# Patient Record
Sex: Male | Born: 1940
Health system: Southern US, Community
[De-identification: ages and names within clinical notes are randomized; demographics above are authoritative.]

## PROBLEM LIST (undated history)

## (undated) DIAGNOSIS — R569 Unspecified convulsions: Secondary | ICD-10-CM

## (undated) DIAGNOSIS — I10 Essential (primary) hypertension: Secondary | ICD-10-CM

## (undated) DIAGNOSIS — M1990 Unspecified osteoarthritis, unspecified site: Secondary | ICD-10-CM

## (undated) DIAGNOSIS — H409 Unspecified glaucoma: Secondary | ICD-10-CM

## (undated) DIAGNOSIS — G4730 Sleep apnea, unspecified: Secondary | ICD-10-CM

## (undated) DIAGNOSIS — R7989 Other specified abnormal findings of blood chemistry: Secondary | ICD-10-CM

## (undated) HISTORY — DX: Other specified abnormal findings of blood chemistry: R79.89

## (undated) HISTORY — PX: COLONOSCOPY: SHX174

## (undated) HISTORY — DX: Unspecified glaucoma: H40.9

---

## 1986-06-24 HISTORY — PX: OTHER SURGICAL HISTORY: SHX169

## 1998-01-14 ENCOUNTER — Emergency Department (HOSPITAL_COMMUNITY): Admission: EM | Admit: 1998-01-14 | Discharge: 1998-01-14 | Payer: Self-pay | Admitting: Emergency Medicine

## 2002-09-16 ENCOUNTER — Ambulatory Visit (HOSPITAL_COMMUNITY): Admission: RE | Admit: 2002-09-16 | Discharge: 2002-09-16 | Payer: Self-pay | Admitting: Gastroenterology

## 2007-09-25 ENCOUNTER — Encounter: Admission: RE | Admit: 2007-09-25 | Discharge: 2007-09-25 | Payer: Self-pay | Admitting: Cardiology

## 2008-02-14 ENCOUNTER — Ambulatory Visit (HOSPITAL_BASED_OUTPATIENT_CLINIC_OR_DEPARTMENT_OTHER): Admission: RE | Admit: 2008-02-14 | Discharge: 2008-02-14 | Payer: Self-pay | Admitting: Cardiology

## 2008-02-20 ENCOUNTER — Ambulatory Visit: Payer: Self-pay | Admitting: Internal Medicine

## 2008-08-12 ENCOUNTER — Encounter: Admission: RE | Admit: 2008-08-12 | Discharge: 2008-08-12 | Payer: Self-pay | Admitting: Internal Medicine

## 2009-01-05 ENCOUNTER — Encounter: Admission: RE | Admit: 2009-01-05 | Discharge: 2009-01-05 | Payer: Self-pay | Admitting: Internal Medicine

## 2010-05-06 ENCOUNTER — Emergency Department (HOSPITAL_COMMUNITY)
Admission: EM | Admit: 2010-05-06 | Discharge: 2010-05-06 | Payer: Self-pay | Source: Home / Self Care | Admitting: Family Medicine

## 2010-11-09 NOTE — Procedures (Signed)
Joseph Copeland, Joseph Copeland               ACCOUNT NO.:  [id number]   MEDICAL RECORD NO.:  [id number]          PATIENT TYPE:  OUT   LOCATION:  SLEEP CENTER                 FACILITY:  [facility]   PHYSICIAN:  Clinton D. Maple Hudson, MD, FCCP, FACPDATE OF BIRTH:  February [birth year], 1942   DATE OF STUDY:                            NOCTURNAL POLYSOMNOGRAM   REFERRING PHYSICIAN:  Osvaldo Shipper. Spruill, M.D.   INDICATION FOR STUDY:  Hypersomnia with sleep apnea.   EPWORTH SLEEPINESS SCORE:  4/24.   BMI 23.4, weight 132 pounds, height 63 inches.   MEDICATIONS:  Home medication charted and reviewed.   SLEEP ARCHITECTURE:  Split study protocol.  During the diagnostic phase,  total sleep time 123 minutes with sleep efficiency 69.5%.  Stage 1 was  4.1%, stage 2 83.3%, stage 3 absent, REM 12.6% of total sleep time.  Sleep latency 34 minutes, REM latency 75 minutes.  Awake after sleep  onset 20 minutes.  Arousal index 2.9.   RESPIRATORY DATA:  Split study protocol.  During the diagnostic phase,  an apnea/hypopnea index (AHI) 26.3.  Fifty-four events were scored, all  hypopneas, nonpositional.  CPAP was titrated to 16 CWP, AHI 0 per hour.  A Mirage Quattro mask was used with heated humidifier.   OXYGEN DATA:  Moderate snoring.  Oxygen desaturation to a nadir of 74%.  After CPAP titration, mean oxygenation held 94% on room air.   CARDIAC DATA:  Normal sinus rhythm.   MOVEMENT/PARASOMNIA:  No significant movement disturbance.   IMPRESSIONS/RECOMMENDATIONS:  1. Moderate obstructive sleep apnea/hypopnea syndrome, AHI 26.3 per      hour, all events were hypopneas, nonpositional.  Moderate snoring      with oxygen desaturation to a nadir of 74%.  2. Successful CPAP titration to 16 CWP, AHI 0 per hour.  He chose a      Mirage Quattro mask with heated humidifier.      Clinton D. Maple Hudson, MD, [facility], FACP  Diplomate, Biomedical engineer of Sleep Medicine  Electronically Signed     CDY/MEDQ  D:  02/20/2008 11:07:15  T:  02/20/2008  11:35:37  Job:  [id number]

## 2010-11-09 NOTE — Op Note (Signed)
   NAME:  Joseph Copeland, Joseph Copeland                         ACCOUNT NO.:  [id number]   MEDICAL RECORD NO.:  [id number]                   PATIENT TYPE:  AMB   LOCATION:  ENDO                                 FACILITY:  [facility]   PHYSICIAN:  Danise Edge, M.D.                DATE OF BIRTH:  02-07-1941   DATE OF PROCEDURE:  09/16/2002  DATE OF DISCHARGE:                                 OPERATIVE REPORT   PROCEDURE:  Screening colonoscopy.   INDICATIONS:  The patient is a 70 year old male born 22-Apr-2041.  The patient  is scheduled to undergo his first screening colonoscopy with polypectomy to  prevent colon cancer.   ENDOSCOPIST:  Danise Edge, M.D.   PREMEDICATION:  Versed 7.5 mg, Demerol 50 mg.   DESCRIPTION OF PROCEDURE:  After obtaining informed consent, the patient was  placed in the left lateral decubitus position.  I administered intravenous  Demerol and intravenous Versed to achieve conscious sedation for the  procedure.  The patient's blood pressure, oxygen saturation, and cardiac  rhythm were monitored throughout the procedure and documented in the medical  record.   Anal inspection was normal.  Digital rectal exam was normal.  The prostate  was non-nodular.  The Olympus adult colonoscope was introduced into the  rectum and easily advanced to the cecum.  Colonic preparation for the exam  today was excellent.   The patient does have a few scattered diverticula throughout his colon  without signs of diverticulitis or diverticular stricture formation.   Rectum normal.   Sigmoid colon and descending colon normal.   Splenic flexure normal.   Transverse colon normal.   Hepatic flexure normal.   Ascending colon normal.   Cecum and ileocecal valve normal.    ASSESSMENT:  The patient has a few scattered diverticula throughout his  colon; otherwise normal proctocolonoscopy to the cecum.  No endoscopic  evidence for the presence of colorectal neoplasia.                           Danise Edge, M.D.    MJ/MEDQ  D:  09/16/2002  T:  09/17/2002  Job:  [id number]   cc:   Candyce Churn, M.D.  [address] [place]  [place] [address]  Fax: [phone]

## 2010-12-27 ENCOUNTER — Emergency Department (HOSPITAL_COMMUNITY): Payer: Medicare Other

## 2010-12-27 ENCOUNTER — Inpatient Hospital Stay (HOSPITAL_COMMUNITY)
Admission: EM | Admit: 2010-12-27 | Discharge: 2010-12-29 | DRG: 638 | Disposition: A | Discharge status: Home / Self Care | Payer: Medicare Other | Attending: Internal Medicine | Admitting: Internal Medicine

## 2010-12-27 DIAGNOSIS — E1169 Type 2 diabetes mellitus with other specified complication: Principal | ICD-10-CM | POA: Diagnosis present

## 2010-12-27 DIAGNOSIS — F341 Dysthymic disorder: Secondary | ICD-10-CM | POA: Diagnosis present

## 2010-12-27 DIAGNOSIS — E871 Hypo-osmolality and hyponatremia: Secondary | ICD-10-CM | POA: Diagnosis present

## 2010-12-27 DIAGNOSIS — M47817 Spondylosis without myelopathy or radiculopathy, lumbosacral region: Secondary | ICD-10-CM | POA: Diagnosis present

## 2010-12-27 DIAGNOSIS — H409 Unspecified glaucoma: Secondary | ICD-10-CM | POA: Diagnosis present

## 2010-12-27 DIAGNOSIS — M545 Low back pain, unspecified: Secondary | ICD-10-CM | POA: Diagnosis present

## 2010-12-27 DIAGNOSIS — G8929 Other chronic pain: Secondary | ICD-10-CM | POA: Diagnosis present

## 2010-12-27 DIAGNOSIS — M1610 Unilateral primary osteoarthritis, unspecified hip: Secondary | ICD-10-CM | POA: Diagnosis present

## 2010-12-27 DIAGNOSIS — D72829 Elevated white blood cell count, unspecified: Secondary | ICD-10-CM | POA: Diagnosis present

## 2010-12-27 DIAGNOSIS — J309 Allergic rhinitis, unspecified: Secondary | ICD-10-CM | POA: Diagnosis present

## 2010-12-27 DIAGNOSIS — Z794 Long term (current) use of insulin: Secondary | ICD-10-CM

## 2010-12-27 DIAGNOSIS — IMO0002 Reserved for concepts with insufficient information to code with codable children: Secondary | ICD-10-CM | POA: Diagnosis present

## 2010-12-27 DIAGNOSIS — H538 Other visual disturbances: Secondary | ICD-10-CM | POA: Diagnosis present

## 2010-12-27 DIAGNOSIS — Z7982 Long term (current) use of aspirin: Secondary | ICD-10-CM

## 2010-12-27 DIAGNOSIS — M169 Osteoarthritis of hip, unspecified: Secondary | ICD-10-CM | POA: Diagnosis present

## 2010-12-27 DIAGNOSIS — K5730 Diverticulosis of large intestine without perforation or abscess without bleeding: Secondary | ICD-10-CM | POA: Diagnosis present

## 2010-12-27 DIAGNOSIS — I10 Essential (primary) hypertension: Secondary | ICD-10-CM | POA: Diagnosis present

## 2010-12-27 LAB — CBC
Hemoglobin: 13.8 g/dL (ref 13.0–17.0)
MCH: 32.5 pg (ref 26.0–34.0)
MCHC: 35.6 g/dL (ref 30.0–36.0)
Platelets: 211 10*3/uL (ref 150–400)

## 2010-12-27 LAB — DIFFERENTIAL
Basophils Absolute: 0 10*3/uL (ref 0.0–0.1)
Basophils Relative: 0 % (ref 0–1)
Eosinophils Absolute: 0 10*3/uL (ref 0.0–0.7)
Monocytes Absolute: 2.1 10*3/uL — ABNORMAL HIGH (ref 0.1–1.0)
Monocytes Relative: 14 % — ABNORMAL HIGH (ref 3–12)
Neutro Abs: 12.1 10*3/uL — ABNORMAL HIGH (ref 1.7–7.7)
Neutrophils Relative %: 80 % — ABNORMAL HIGH (ref 43–77)

## 2010-12-27 LAB — BASIC METABOLIC PANEL
Calcium: 9.7 mg/dL (ref 8.4–10.5)
GFR calc Af Amer: 57 mL/min — ABNORMAL LOW (ref >60)
GFR calc non Af Amer: 47 mL/min — ABNORMAL LOW (ref >60)
Glucose, Bld: 393 mg/dL — ABNORMAL HIGH (ref 70–99)
Potassium: 4 mEq/L (ref 3.5–5.1)
Sodium: 123 mEq/L — ABNORMAL LOW (ref 135–145)

## 2010-12-28 ENCOUNTER — Inpatient Hospital Stay (HOSPITAL_COMMUNITY): Payer: Medicare Other

## 2010-12-28 ENCOUNTER — Emergency Department (HOSPITAL_COMMUNITY): Payer: Medicare Other

## 2010-12-28 LAB — URINALYSIS, ROUTINE W REFLEX MICROSCOPIC
Leukocytes, UA: NEGATIVE
Nitrite: NEGATIVE
Protein, ur: NEGATIVE mg/dL
Specific Gravity, Urine: 1.033 — ABNORMAL HIGH (ref 1.005–1.030)
Urobilinogen, UA: 1 mg/dL (ref 0.0–1.0)

## 2010-12-28 LAB — HEPATIC FUNCTION PANEL
ALT: 134 U/L — ABNORMAL HIGH (ref 0–53)
Bilirubin, Direct: 0.3 mg/dL (ref 0.0–0.3)
Indirect Bilirubin: 0.7 mg/dL (ref 0.3–0.9)
Total Protein: 6 g/dL (ref 6.0–8.3)

## 2010-12-28 LAB — GLUCOSE, CAPILLARY
Glucose-Capillary: 313 mg/dL — ABNORMAL HIGH (ref 70–99)
Glucose-Capillary: 267 mg/dL — ABNORMAL HIGH (ref 70–99)
Glucose-Capillary: 135 mg/dL — ABNORMAL HIGH (ref 70–99)
Glucose-Capillary: 198 mg/dL — ABNORMAL HIGH (ref 70–99)

## 2010-12-28 LAB — BASIC METABOLIC PANEL
BUN: 20 mg/dL (ref 6–23)
CO2: 22 mEq/L (ref 19–32)
Calcium: 8.4 mg/dL (ref 8.4–10.5)
Chloride: 96 mEq/L (ref 96–112)
Chloride: 101 mEq/L (ref 96–112)
Creatinine, Ser: 1.08 mg/dL (ref 0.50–1.35)
GFR calc Af Amer: >60 mL/min (ref >60)
GFR calc Af Amer: >60 mL/min (ref >60)
Potassium: 3.9 mEq/L (ref 3.5–5.1)
Sodium: 128 mEq/L — ABNORMAL LOW (ref 135–145)

## 2010-12-28 LAB — CBC
HCT: 33.3 % — ABNORMAL LOW (ref 39.0–52.0)
Hemoglobin: 12 g/dL — ABNORMAL LOW (ref 13.0–17.0)
MCV: 90.7 fL (ref 78.0–100.0)
RBC: 3.67 MIL/uL — ABNORMAL LOW (ref 4.22–5.81)
RDW: 13.6 % (ref 11.5–15.5)
WBC: 10.8 10*3/uL — ABNORMAL HIGH (ref 4.0–10.5)

## 2010-12-28 LAB — CARDIAC PANEL(CRET KIN+CKTOT+MB+TROPI)
CK, MB: 3.5 ng/mL (ref 0.3–4.0)
Total CK: 108 U/L (ref 7–232)
Troponin I: <0.3 ng/mL (ref <0.30)
Troponin I: <0.3 ng/mL (ref <0.30)

## 2010-12-28 LAB — CK TOTAL AND CKMB (NOT AT ARMC)
Relative Index: INVALID (ref 0.0–2.5)
Total CK: 99 U/L (ref 7–232)

## 2010-12-28 LAB — POCT I-STAT 3, ART BLOOD GAS (G3+)
Patient temperature: 98.6
pCO2 arterial: 34.5 mmHg — ABNORMAL LOW (ref 35.0–45.0)
pH, Arterial: 7.385 (ref 7.350–7.450)

## 2010-12-28 LAB — LIPID PANEL
Cholesterol: 223 mg/dL — ABNORMAL HIGH (ref 0–200)
HDL: 52 mg/dL (ref >39)
Total CHOL/HDL Ratio: 4.3 RATIO

## 2010-12-28 LAB — URINE MICROSCOPIC-ADD ON

## 2010-12-28 LAB — SODIUM, URINE, RANDOM: Sodium, Ur: 10 mEq/L

## 2010-12-28 LAB — PROTIME-INR: INR: 1.03 (ref 0.00–1.49)

## 2010-12-28 LAB — CHLORIDE, URINE, RANDOM: Chloride Urine: <24 mEq/L

## 2010-12-28 LAB — APTT: aPTT: 25 seconds (ref 24–37)

## 2010-12-28 LAB — OSMOLALITY, URINE: Osmolality, Ur: 634 mOsm/kg (ref 390–1090)

## 2011-01-01 NOTE — Discharge Summary (Signed)
Joseph Copeland, Joseph Copeland NO.:  [id number]  MEDICAL RECORD NO.:  [id number]  LOCATION:  3033                         FACILITY:  [facility]  PHYSICIAN:  Pearla Dubonnet, M.D.DATE OF BIRTH:  12-Sep-1940  DATE OF ADMISSION:  12/28/2010 DATE OF DISCHARGE:  12/29/2010                              DISCHARGE SUMMARY   DISCHARGE DIAGNOSES: 1. New onset type 2 diabetes mellitus with blood sugar on admission of     393. 2. Lumbar degenerative joint disease with recent epidural injection     approximately 2 weeks prior to admission. 3. Dizziness and blurred vision, improved with rehydration and blood     sugar control. 4. Hypertension. 5. Dysthymia. 6. Sleep apnea. 7. Mild glaucoma. 8. Degenerative joint disease of the hips. 9. Episodic sciatica with L3-4 radiculopathy, followed by Dr. Barnett Copeland. 10.Diverticulosis. 11.Allergic rhinitis.  DISCHARGE MEDICATIONS: 1. Amlodipine 5 mg daily. 2. Diovan 160 mg 1/2 tablet daily. 3. Aspirin 81 mg a day. 4. Tramadol 50 mg q.4-6 hours p.r.n. pain. 5. Azucena Freed 2% gel 4 times daily for hypogonadism. 6. Metformin 500 mg t.i.d. with meals.  DISCHARGE LABORATORY DATA:  Serial cardiac enzymes within normal limits x3.  Hemoglobin A1c elevated at 9.3%.  Cholesterol profile reveals total cholesterol 223, LDL 148, HDL 52 with a ratio of 4.23.  Basic metabolic profile on admission revealed a sodium 123, potassium 4.0, chloride 86, bicarb 23, glucose 393, BUN 26, and creatinine 1.48 and BMET on December 28, 2010, at 7:05 a.m. revealed a sodium 132, potassium 3.9, chloride 101, bicarb 22, glucose 279, BUN 20, and creatinine 0.92.  Hepatic profile on December 28, 2010, revealed a total bili 1.0, direct bili 0.3, indirect bili 0.7, alk phos 81, AST mildly elevated at 85, and ALT moderately elevated at 134.  Albumin 2.8, total protein 6.0.  Urinalysis on admission revealed the urine to be amber, cloudy with specific gravity of 1.033, urine  glucose greater than 1000, 15 ketones, otherwise negative. Microscopic revealed rare squamous epithelial cells, 3-6 white cells, 0- 2 red cells, and rare bacteria, there was some mucus present.  RADIOLOGICAL IMAGES:  Chest x-ray on December 28, 2010, revealing no acute cardiopulmonary process and CT scan of the head without contrast revealed mild atrophy with no acute intracranial abnormality and MRI and MRA is still pending at the time of dictation.  FAMILY HISTORY:  Significant for glaucoma and seizures.  One sister has diabetes and is on metformin which she tolerates well.  HOSPITAL COURSE:  Joseph Copeland is a very pleasant 70 year old male with known history of hypertension, chronic low back pain and he received an epidural shot approximately 2 weeks ago and he has been having polyphagia, polydipsia, and polyuria over the past 10 days to 2 weeks. On the morning of admission, he was walking towards his car and getting ready to go to a trip for the beach and seemed to be unsteady on his feet and slightly confused.  His wife brought him to the emergency room where his blood sugar was found to be approximately 400.  He was also hyponatremic and with sliding scale NovoLog insulin and saline hydration, laboratories have corrected very  quickly.  He was also put on Lantus 5 units daily.  He is feeling much better even after less than 24 hours after admission. He is receiving diabetic teaching.  We will plan to discharge him home on a no-concentrated sweet diet with no added salt and will discontinue thiazide therapy and add metformin 500 mg with meals and see back in the office early next week.  The patient should, again, refrain from all simple sugars such as soft drinks, etc.  The patient does have an MRI/MRA pending to rule out any intracranial abnormality causing his confusion and disequilibrium yesterday.  CONDITION ON DISCHARGE:  Improving.  Time spent evaluating the patient reviewing  the chart and performing discharge summary was 35 minutes.     Pearla Dubonnet, M.D.     RNG/MEDQ  D:  12/28/2010  T:  12/29/2010  Job:  [id number]  Electronically Signed by Marden Noble M.D. on 01/01/2011 09:47:33 PM

## 2011-01-08 NOTE — H&P (Signed)
NAMECHANNING, YEAGER NO.:  [id number]  MEDICAL RECORD NO.:  [id number]  LOCATION:                                 FACILITY:  PHYSICIAN:  Eduard Clos, MDDATE OF BIRTH:  [birth year]  DATE OF ADMISSION: DATE OF DISCHARGE:                             HISTORY & PHYSICAL   PRIMARY CARE PHYSICIAN:  Pearla Dubonnet, MD  CHIEF COMPLAINT:  Dizziness and blurred vision.  HISTORY OF PRESENT ILLNESS:  This 70 year old male with known history of hypertension and chronic low back pain, for which he has received an epidural shot 2 weeks ago presents with complaints of dizziness and blurred vision which happened from yesterday morning which was persistent.  By the time of evening, he went to the urgent care, he was referred to the ER.  The patient was found to be hyponatremic and hyperglycemic.  The patient has not had a previous history of diabetes.  The patient denies any chest pain, shortness of breath, nausea, vomiting, abdominal pain, dysuria, discharge, diarrhoea.  Denies any difficulty swallowing or speaking.  Denies any focal deficit.  PAST MEDICAL HISTORY: 1. Hypertension. 2. Chronic low back pain.  PAST SURGICAL HISTORY:  Epidural shots.  MEDICATIONS PRIOR TO ADMISSION:  The patient is on multivitamins, aspirin 81 mg p.o. daily, Zylet eye drops, Fortesta 10 mg gel pump, Azor.  ALLERGIES:  No known drug allergies.  FAMILY HISTORY:  Negative for any stroke, diabetes, cancer, or MI as per the patient.  SOCIAL HISTORY:  The patient denies smoking cigarettes, drinking alcohol, or using illegal drugs.  REVIEW OF SYSTEMS:  As per the history of presenting illness, nothing else significant.  PHYSICAL EXAMINATION:  GENERAL:  The patient was examined at bedside, not in acute distress. VITAL SIGNS:  Blood pressure is 110/60, pulse is 90 per minute, temperature 98.6, respirations 18 per minute, O2 sat is 98%. HEENT:  Anicteric.  No pallor.  No  discharge from ears, eyes, nose, or mouth. CHEST:  Bilateral air entry present.  No rhonchi, no crepitation. HEART:  S1 and S2 heard. ABDOMEN:  Soft, nontender.  Bowel sounds heard. CENTRAL NERVOUS SYSTEM:  The patient is alert, awake, and oriented to time, place, and person.  He is able to move upper and lower extremities, 5/5.  There is no pronator drift, dysdiadochocinesia, or ataxia.  The patient's facial features, there is no focal deficit. PERRLA positive.  No neck rigidity.  Tongue is midline. EXTREMITIES:  Peripheral pulses felt.  No edema.  LABORATORY DATA:  EKG shows normal sinus rhythm, heart rate is around 82 beats per minute with nonspecific ST-T changes. Chest x-ray shows no acute cardiopulmonary process. WBC is 15.2, hemoglobin is 13.8, hematocrit is 38.8, platelets 211, neutrophils is 80%.  Basic metabolic panel:  Sodium 123, potassium 4, chloride 86, carbon dioxide 23, glucose 393, BUN 26, creatinine 1.4, calcium 9.7.  Troponin less than 0.3.  UA shows cloudy urine, glucose 1000, ketones 15.  The patient's anion gap is 14.  Nitrites negative, leukocytes negative, wbc's 3-6, hyaline casts, bacteria rare.  ASSESSMENT: 1. Dizziness and blurred vision. 2. Hyperglycemia with possible new-onset diabetes mellitus, type 2. 3. History of hypertension. 4.  Leukocytosis. 5. Hyponatremia.  PLAN: 1. At this time, I will admit the patient to telemetry. 2. Dizziness with blurred vision.  At this time, it could be from     dehydration and hyperglycemia, but we have to rule out CVA, for     which a CT head has been ordered at this time.  We will also get an     MRI of the brain.  We will place the patient on neuro checks, get     carotid Doppler and 2-D echo. 3. Hyperglycemia.  At this time, the patient's anion gap is 14.  The     patient probably has new-onset diabetes mellitus, type 2.  I am     going to repeat his BMET again to make sure his anion gap is not     worsening; if  his anion gap is worsening, then we need to keep the     patient on IV insulin drip; if it is getting better, then     eventually the patient will need definite antidiabetic medications,     for which at this time I am going to order hemoglobin A1c.  The     patient will be placed on sliding scale coverage and continued     hydration. 4. Hyponatremia.  Hyponatremia could be from dehydration, also from     the hyperglycemia.  We will closely watch his BMET, for which I     have ordered one at this time and we will be ordering two more q.4     hourly. 5. We will be checking fasting lipid panel, hemoglobin A1c, and     cycling cardiac markers, and at this time, CT head is still     pending.  We will follow CT head and eventually MRI of the brain.     Also get an ABG and further recommendations based on test order and     clinical course.     Eduard Clos, MD     ANK/MEDQ  D:  12/28/2010  T:  12/28/2010  Job:  [id number]  cc:   Pearla Dubonnet, M.D.  Electronically Signed by Midge Minium MD on 01/08/2011 07:31:52 AM

## 2011-06-03 ENCOUNTER — Inpatient Hospital Stay (HOSPITAL_COMMUNITY): Payer: Medicare Other

## 2011-06-03 ENCOUNTER — Inpatient Hospital Stay (HOSPITAL_COMMUNITY)
Admission: EM | Admit: 2011-06-03 | Discharge: 2011-06-05 | DRG: 101 | Disposition: A | Discharge status: Home / Self Care | Payer: Medicare Other | Attending: Internal Medicine | Admitting: Internal Medicine

## 2011-06-03 ENCOUNTER — Other Ambulatory Visit: Payer: Self-pay

## 2011-06-03 ENCOUNTER — Emergency Department (HOSPITAL_COMMUNITY): Payer: Medicare Other

## 2011-06-03 ENCOUNTER — Encounter: Payer: Self-pay | Admitting: Emergency Medicine

## 2011-06-03 DIAGNOSIS — Z87891 Personal history of nicotine dependence: Secondary | ICD-10-CM

## 2011-06-03 DIAGNOSIS — E118 Type 2 diabetes mellitus with unspecified complications: Secondary | ICD-10-CM | POA: Diagnosis present

## 2011-06-03 DIAGNOSIS — F1020 Alcohol dependence, uncomplicated: Secondary | ICD-10-CM | POA: Diagnosis present

## 2011-06-03 DIAGNOSIS — M549 Dorsalgia, unspecified: Secondary | ICD-10-CM | POA: Diagnosis present

## 2011-06-03 DIAGNOSIS — I10 Essential (primary) hypertension: Secondary | ICD-10-CM | POA: Diagnosis present

## 2011-06-03 DIAGNOSIS — D696 Thrombocytopenia, unspecified: Secondary | ICD-10-CM | POA: Diagnosis present

## 2011-06-03 DIAGNOSIS — E119 Type 2 diabetes mellitus without complications: Secondary | ICD-10-CM | POA: Diagnosis present

## 2011-06-03 DIAGNOSIS — Z79899 Other long term (current) drug therapy: Secondary | ICD-10-CM

## 2011-06-03 DIAGNOSIS — G8929 Other chronic pain: Secondary | ICD-10-CM | POA: Diagnosis present

## 2011-06-03 DIAGNOSIS — M129 Arthropathy, unspecified: Secondary | ICD-10-CM | POA: Diagnosis present

## 2011-06-03 DIAGNOSIS — G40909 Epilepsy, unspecified, not intractable, without status epilepticus: Principal | ICD-10-CM | POA: Diagnosis present

## 2011-06-03 DIAGNOSIS — D649 Anemia, unspecified: Secondary | ICD-10-CM | POA: Diagnosis present

## 2011-06-03 DIAGNOSIS — J029 Acute pharyngitis, unspecified: Secondary | ICD-10-CM | POA: Diagnosis present

## 2011-06-03 DIAGNOSIS — R569 Unspecified convulsions: Secondary | ICD-10-CM

## 2011-06-03 DIAGNOSIS — I672 Cerebral atherosclerosis: Secondary | ICD-10-CM | POA: Diagnosis present

## 2011-06-03 HISTORY — DX: Essential (primary) hypertension: I10

## 2011-06-03 HISTORY — DX: Unspecified convulsions: R56.9

## 2011-06-03 HISTORY — DX: Unspecified osteoarthritis, unspecified site: M19.90

## 2011-06-03 LAB — CARDIAC PANEL(CRET KIN+CKTOT+MB+TROPI): Total CK: 1887 U/L — ABNORMAL HIGH (ref 7–232)

## 2011-06-03 LAB — PHOSPHORUS: Phosphorus: 1.1 mg/dL — ABNORMAL LOW (ref 2.3–4.6)

## 2011-06-03 LAB — DIFFERENTIAL
Eosinophils Relative: 0 % (ref 0–5)
Lymphocytes Relative: 15 % (ref 12–46)
Lymphs Abs: 0.9 10*3/uL (ref 0.7–4.0)
Monocytes Absolute: 0.5 10*3/uL (ref 0.1–1.0)

## 2011-06-03 LAB — COMPREHENSIVE METABOLIC PANEL
ALT: 51 U/L (ref 0–53)
CO2: 19 mEq/L (ref 19–32)
Calcium: 8.7 mg/dL (ref 8.4–10.5)
Creatinine, Ser: 0.68 mg/dL (ref 0.50–1.35)
GFR calc Af Amer: >90 mL/min (ref >90)
GFR calc non Af Amer: >90 mL/min (ref >90)
Glucose, Bld: 203 mg/dL — ABNORMAL HIGH (ref 70–99)

## 2011-06-03 LAB — CBC
HCT: 35.6 % — ABNORMAL LOW (ref 39.0–52.0)
MCV: 94.9 fL (ref 78.0–100.0)
RBC: 3.75 MIL/uL — ABNORMAL LOW (ref 4.22–5.81)
WBC: 5.8 10*3/uL (ref 4.0–10.5)

## 2011-06-03 LAB — RETICULOCYTES
RBC.: 3.83 MIL/uL — ABNORMAL LOW (ref 4.22–5.81)
Retic Ct Pct: 1.2 % (ref 0.4–3.1)

## 2011-06-03 LAB — URINALYSIS, ROUTINE W REFLEX MICROSCOPIC
Glucose, UA: NEGATIVE mg/dL
Hgb urine dipstick: NEGATIVE
Ketones, ur: NEGATIVE mg/dL
Protein, ur: NEGATIVE mg/dL

## 2011-06-03 LAB — APTT: aPTT: 31 seconds (ref 24–37)

## 2011-06-03 LAB — GLUCOSE, CAPILLARY
Glucose-Capillary: 183 mg/dL — ABNORMAL HIGH (ref 70–99)
Glucose-Capillary: 115 mg/dL — ABNORMAL HIGH (ref 70–99)

## 2011-06-03 LAB — IRON AND TIBC
Saturation Ratios: 37 % (ref 20–55)
UIBC: 226 ug/dL (ref 125–400)

## 2011-06-03 MED ORDER — MORPHINE SULFATE 2 MG/ML IJ SOLN: 0.5000 mg | INTRAMUSCULAR | Status: DC | PRN

## 2011-06-03 MED ORDER — OMEGA-3-ACID ETHYL ESTERS 1 G PO CAPS
2.0000 g | ORAL_CAPSULE | Freq: Every day | ORAL | Status: DC
  Administered 2011-06-03 – 2011-06-05 (×3): 2 g via ORAL
  Filled 2011-06-03 (×3): qty 2

## 2011-06-03 MED ORDER — PHENYTOIN SODIUM 50 MG/ML IJ SOLN
800.0000 mg | Freq: Once | INTRAMUSCULAR | Status: AC
  Administered 2011-06-03: 800 mg via INTRAVENOUS
  Filled 2011-06-03: qty 16

## 2011-06-03 MED ORDER — OMEGA-3-ACID ETHYL ESTERS 1 G PO CAPS
2.0000 g | ORAL_CAPSULE | Freq: Every day | ORAL | Status: DC
  Administered 2011-06-04: 2 g via ORAL
  Filled 2011-06-03 (×3): qty 2

## 2011-06-03 MED ORDER — METOPROLOL TARTRATE 12.5 MG HALF TABLET
12.5000 mg | ORAL_TABLET | Freq: Two times a day (BID) | ORAL | Status: DC
  Administered 2011-06-03 – 2011-06-05 (×5): 12.5 mg via ORAL
  Filled 2011-06-03 (×6): qty 1

## 2011-06-03 MED ORDER — PHENYTOIN 50 MG PO CHEW
100.0000 mg | CHEWABLE_TABLET | Freq: Three times a day (TID) | ORAL | Status: DC
  Administered 2011-06-04 – 2011-06-05 (×6): 100 mg via ORAL
  Filled 2011-06-03 (×7): qty 2

## 2011-06-03 MED ORDER — SODIUM CHLORIDE 0.9 % IV BOLUS (SEPSIS)
1000.0000 mL | Freq: Once | INTRAVENOUS | Status: AC
  Administered 2011-06-03: 1000 mL via INTRAVENOUS

## 2011-06-03 MED ORDER — WHITE PETROLATUM GEL
Status: AC
  Administered 2011-06-03: 16:00:00
  Filled 2011-06-03: qty 5

## 2011-06-03 MED ORDER — DEXTROSE 5 % IV SOLN
15.0000 mmol | Freq: Once | INTRAVENOUS | Status: AC
  Administered 2011-06-03: 15 mmol via INTRAVENOUS
  Filled 2011-06-03: qty 5

## 2011-06-03 MED ORDER — MAGNESIUM OXIDE 400 MG PO TABS
400.0000 mg | ORAL_TABLET | Freq: Two times a day (BID) | ORAL | Status: DC
  Administered 2011-06-04 – 2011-06-05 (×4): 400 mg via ORAL
  Filled 2011-06-03 (×5): qty 1

## 2011-06-03 MED ORDER — LORAZEPAM 2 MG/ML IJ SOLN
1.0000 mg | INTRAMUSCULAR | Status: DC | PRN
  Administered 2011-06-03: 1 mg via INTRAVENOUS
  Filled 2011-06-03: qty 1

## 2011-06-03 MED ORDER — LORAZEPAM 2 MG/ML IJ SOLN
INTRAMUSCULAR | Status: AC
  Administered 2011-06-03: 2 mg
  Filled 2011-06-03: qty 1

## 2011-06-03 MED ORDER — ONDANSETRON HCL 4 MG/2ML IJ SOLN: 4.0000 mg | Freq: Four times a day (QID) | INTRAMUSCULAR | Status: DC | PRN

## 2011-06-03 MED ORDER — THERA M PLUS PO TABS
1.0000 | ORAL_TABLET | Freq: Every day | ORAL | Status: DC
  Administered 2011-06-03 – 2011-06-05 (×3): 1 via ORAL
  Filled 2011-06-03 (×3): qty 1

## 2011-06-03 MED ORDER — ACETAMINOPHEN 325 MG PO TABS
650.0000 mg | ORAL_TABLET | Freq: Four times a day (QID) | ORAL | Status: DC | PRN
  Filled 2011-06-03: qty 2

## 2011-06-03 MED ORDER — FOLIC ACID 1 MG PO TABS
1.0000 mg | ORAL_TABLET | Freq: Every day | ORAL | Status: DC
  Administered 2011-06-03 – 2011-06-05 (×3): 1 mg via ORAL
  Filled 2011-06-03 (×3): qty 1

## 2011-06-03 MED ORDER — OMEGA-3 FATTY ACIDS 1000 MG PO CAPS: 2.0000 g | ORAL_CAPSULE | Freq: Every day | ORAL | Status: DC

## 2011-06-03 MED ORDER — ACETAMINOPHEN 650 MG RE SUPP: 650.0000 mg | Freq: Four times a day (QID) | RECTAL | Status: DC | PRN

## 2011-06-03 MED ORDER — OXYCODONE HCL 5 MG PO TABS
5.0000 mg | ORAL_TABLET | ORAL | Status: DC | PRN
  Administered 2011-06-05 (×2): 5 mg via ORAL
  Filled 2011-06-03 (×2): qty 1

## 2011-06-03 MED ORDER — MAGNESIUM SULFATE 40 MG/ML IJ SOLN
2.0000 g | Freq: Once | INTRAMUSCULAR | Status: AC
  Administered 2011-06-03: 2 g via INTRAVENOUS
  Filled 2011-06-03: qty 50

## 2011-06-03 MED ORDER — VITAMIN B-1 100 MG PO TABS
100.0000 mg | ORAL_TABLET | Freq: Every day | ORAL | Status: DC
  Administered 2011-06-03 – 2011-06-05 (×3): 100 mg via ORAL
  Filled 2011-06-03 (×3): qty 1

## 2011-06-03 MED ORDER — DOCUSATE SODIUM 100 MG PO CAPS
100.0000 mg | ORAL_CAPSULE | Freq: Two times a day (BID) | ORAL | Status: DC
  Administered 2011-06-03 – 2011-06-05 (×5): 100 mg via ORAL
  Filled 2011-06-03 (×4): qty 1

## 2011-06-03 MED ORDER — SODIUM CHLORIDE 0.9 % IV SOLN: INTRAVENOUS | Status: AC

## 2011-06-03 MED ORDER — ONDANSETRON HCL 4 MG PO TABS: 4.0000 mg | ORAL_TABLET | Freq: Four times a day (QID) | ORAL | Status: DC | PRN

## 2011-06-03 MED ORDER — PANTOPRAZOLE SODIUM 40 MG PO TBEC
40.0000 mg | DELAYED_RELEASE_TABLET | Freq: Every day | ORAL | Status: DC
  Administered 2011-06-03 – 2011-06-05 (×3): 40 mg via ORAL
  Filled 2011-06-03 (×3): qty 1

## 2011-06-03 NOTE — H&P (Signed)
PCP:   Dr Robley Fries.   Chief Complaint:  Seizures this morning.  HPI: Joseph Copeland was noted to have jerking movements with frothing in the mouth by his wife when they wake up this morning. Patient would not respond to the wife, who immediately called 911. EMS felt he had seizures. This is the first such episode. Patient denies any prodromal symptoms and had been doing well day prior. No trauma. He drinks alcohol but wife says this is just on occasions. He lst drank last week. Patient denies headache or fever. No diarrhea. No illicit drugs. He has some htn/dm but has not been on meds? Diet controlled. Investigations in ED show normal ct brain/cxr/ekg. He has some anemia. Patient given ativan/dilantin in ed.  Review of Systems:  The patient denies anorexia, fever, weight loss,, vision loss, decreased hearing, hoarseness, chest pain, syncope, dyspnea on exertion, peripheral edema, balance deficits, hemoptysis, abdominal pain, melena, hematochezia, severe indigestion/heartburn, hematuria, incontinence, genital sores, muscle weakness, suspicious skin lesions, transient blindness, difficulty walking, depression, unusual weight change, abnormal bleeding, enlarged lymph nodes, angioedema, and breast masses.  Past Medical History: Past Medical History  Diagnosis Date  . Shortness of breath   . Seizures   . Hypertension   . Arthritis   . Diabetes mellitus     type 2   Past Surgical History  Procedure Date  . Hemrhoidectomy     Medications: Prior to Admission medications   Medication Sig Start Date End Date Taking? Authorizing Provider  CALCIUM CARBONATE PO Take 1 tablet by mouth daily.     Yes Historical Provider, MD  Cholecalciferol (VITAMIN D PO) Take 1 tablet by mouth daily.     Yes Historical Provider, MD  Cyanocobalamin (VITAMIN B 12 PO) Take 1 tablet by mouth daily.     Yes Historical Provider, MD  fish oil-omega-3 fatty acids 1000 MG capsule Take 2 g by mouth daily.     Yes Historical  Provider, MD  Multiple Vitamins-Minerals (MULTIVITAMINS THER. W/MINERALS) TABS Take 1 tablet by mouth daily.     Yes Historical Provider, MD  VITAMIN A PO Take 1 tablet by mouth daily.     Yes Historical Provider, MD    Allergies:  No Known Allergies  Social History:  reports that he has quit smoking. He has never used smokeless tobacco. He reports that he drinks about 12.6 ounces of alcohol per week. He reports that he does not use illicit drugs.  Family History: History reviewed. No pertinent family history.  Physical Exam: Filed Vitals:   06/03/11 0632 06/03/11 0700 06/03/11 1034 06/03/11 1834  BP: 147/89 164/94 108/60 132/78  Pulse: 100 93 109   Temp: 98.5 F (36.9 C)  98.1 F (36.7 C) 98.5 F (36.9 C)  TempSrc: Oral  Oral Oral  Resp: 21 17 18 18   SpO2: 96% 97% 93% 94%   General appearance: alert but groggy. Pale. HEENT- PERRLA. No nystagmus. No neck stiffness. RS-lungs clear. CVS-S1S2.RRR. Abdomen- soft, non tender. +BS CNS- grossly intact. Extremities-no pedal edema.    Labs on Admission:   [facility] 06/03/11 1611 06/03/11 0646  NA -- 137  K -- 4.0  CL -- 99  CO2 -- 19  GLUCOSE -- 203*  BUN -- 8  CREATININE -- 0.68  CALCIUM -- 8.7  MG 1.4* --  PHOS 1.1* --    Basename 06/03/11 0646  AST 83*  ALT 51  ALKPHOS 90  BILITOT 0.6  PROT 7.4  ALBUMIN 3.6   No results  found for this basename: LIPASE:2,AMYLASE:2 in the last 72 hours  Basename 06/03/11 0646  WBC 5.8  NEUTROABS 4.4  HGB 11.7*  HCT 35.6*  MCV 94.9  PLT 126*   No results found for this basename: CKTOTAL:3,CKMB:3,CKMBINDEX:3,TROPONINI:3 in the last 72 hours No results found for this basename: TSH,T4TOTAL,FREET3,T3FREE,THYROIDAB in the last 72 hours  Basename 06/03/11 1611  VITAMINB12 --  FOLATE --  FERRITIN --  TIBC --  IRON --  RETICCTPCT 1.2    Radiological Exams on Admission: Ct Head Wo Contrast  06/03/2011  *RADIOLOGY REPORT*  Clinical Data: C year.  CT HEAD WITHOUT  CONTRAST  Technique:  Contiguous axial images were obtained from the base of the skull through the vertex without contrast.  Comparison: Head CT 12/28/2010.  Findings: There is stable age advanced cerebral atrophy.  The ventricles are in the midline without mass effect or shift.  No extra-axial fluid collections are identified.  No CT findings for acute hemispheric infarction and/or intracranial hemorrhage. Stable benign appearing bilateral basal ganglia calcifications. The brainstem and cerebellum appear normal.  No mass lesions.  The bony structures are intact.  The paranasal sinuses and mastoid air cells are clear.  IMPRESSION:  1.  Stable age advanced cerebral atrophy. 2.  No acute intracranial findings or mass lesions.  Original Report Authenticated By: P. Loralie Champagne, M.D.   Portable Chest 1 View  06/03/2011  *RADIOLOGY REPORT*  Clinical Data: New onset seizure.  Question pneumonia.  PORTABLE CHEST - 1 VIEW  Comparison: 12/28/2010 and 01/03/2009 radiographs.  Findings: 1618 hours.  There is mild patient rotation to the right. Heart size and mediastinal contours are stable.  The lungs are clear.  There is no pleural effusion or pneumothorax.  Telemetry leads overlie the chest.  IMPRESSION: No active cardiopulmonary process.  Original Report Authenticated By: Gerrianne Scale, M.D.    Assessment Adult onset seizures in amle with htn/dm, apparently alcoholic. ?withdrawal seizures vs other etiologies. Plan .Seizures, generalized convulsive- admit telemetry. MRI/EEG. Dilantin/ativan as patient had more than 1 episode, one witnessed in ER. Marland KitchenHTN (hypertension)- start low dose lopressor. .DM (diabetes mellitus), type 2- ssi/hbA1C. Marland Kitchen Anemia- anemia panel/rpr .Thrombocytopenia- likely secondary to alcoholism .alcoholism- ativan prn/magnesium oxide/folate/thiamine/abdominal ultrasound. Dvt/gi prophylaxis. Dr Kevan Ny will see patient in am. Condition guarded. Gaynor Ferreras 06/03/2011, 7:12 PM

## 2011-06-03 NOTE — ED Notes (Signed)
Seizure precautions in place currently.

## 2011-06-03 NOTE — ED Notes (Signed)
Nursing called to room. Pt was having tonic clonic seizure. Pt rolled onto right side, non rebreather placed on patient, suction at the bedside. Nursing pulled meds and gave them. Seizure lasted approx 1 minute. Pt bit his tongue during seizure. Airway kept clear.edp made aware of change and he came to speak with the family. Pt's family has been updated and informed of plan for admission. Pt was incontinent after seizure during post ictal phase. Gown and bed changed and pt cleaned. Nursing to continue to monitor. cbg was obtained due to pt's diaphoresis and was wnl.

## 2011-06-03 NOTE — ED Notes (Signed)
Patient transported to CT 

## 2011-06-03 NOTE — ED Provider Notes (Signed)
History     CSN: [id number] Arrival date & time: 06/03/2011  6:31 AM   First MD Initiated Contact with Patient 06/03/11 [phone]      Chief Complaint  Patient presents with  . Seizures    (Consider location/radiation/quality/duration/timing/severity/associated sxs/prior treatment) Patient is a 70 y.o. male presenting with seizures. The history is provided by the patient. No language interpreter was used.  Seizures  This is a new problem. The current episode started less than 1 hour ago. There was 1 seizure. Associated symptoms include confusion. Pertinent negatives include no sleepiness, no headaches, no speech difficulty, no visual disturbance, no neck stiffness, no sore throat, no chest pain, no cough, no nausea, no vomiting, no diarrhea and no muscle weakness. Characteristics include eye deviation and rhythmic jerking. Characteristics do not include eye blinking, bowel incontinence, bladder incontinence, bit tongue, apnea or cyanosis. The episode was witnessed. There was no sensation of an aura present. The seizures did not continue in the ED. The seizure(s) had no focality. Possible causes include change in alcohol use. Possible causes do not include med or dosage change, sleep deprivation, missed seizure meds or recent illness. There has been no fever. There were no medications administered prior to arrival.  Here today with c/o seizure witnessed by his wife.  States that he was asleep and he started shaking all over x 2 minutes and foaming at the mouth.  Patient denies loss of sleep or stress lately in his life.  Did not bite his tongue or incontinent.  No PMH of seizures.  States that he has a couple vodka drinks daily but did not drink anything this weekend.  Last drink was Friday.  Wife states that he does not drink that much.  Patient states that he drinks almost every day.  Also states that he used to take metformin but the [place] took him off of that. Goes to Dr. Kevan Ny.  He is a Editor, commissioning at  [employer].  Was post ictal but oriented to place and self now.  Denies h/a.  Past Medical History  Diagnosis Date  . Diabetes mellitus     No past surgical history on file.  No family history on file.  History  Substance Use Topics  . Smoking status: Not on file  . Smokeless tobacco: Not on file  . Alcohol Use: 12.6 oz/week    21 Shots of liquor per week      Review of Systems  HENT: Negative for sore throat.   Eyes: Negative for visual disturbance.  Respiratory: Negative for apnea and cough.   Cardiovascular: Negative for chest pain and cyanosis.  Gastrointestinal: Negative for nausea, vomiting, diarrhea and bowel incontinence.  Genitourinary: Negative for bladder incontinence.  Neurological: Positive for seizures. Negative for speech difficulty and headaches.  Psychiatric/Behavioral: Positive for confusion.  All other systems reviewed and are negative.    Allergies  Review of patient's allergies indicates no known allergies.  Home Medications   Current Outpatient Rx  Name Route Sig Dispense Refill  . CALCIUM CARBONATE PO Oral Take 1 tablet by mouth daily.      Marland Kitchen VITAMIN D PO Oral Take 1 tablet by mouth daily.      Marland Kitchen VITAMIN B 12 PO Oral Take 1 tablet by mouth daily.      . OMEGA-3 FATTY ACIDS 1000 MG PO CAPS Oral Take 2 g by mouth daily.      Carma Leaven M PLUS PO TABS Oral Take 1 tablet by mouth daily.      Marland Kitchen  VITAMIN A PO Oral Take 1 tablet by mouth daily.        BP 164/94  Pulse 93  Temp(Src) 98.5 F (36.9 C) (Oral)  Resp 17  SpO2 97%  Physical Exam  Nursing note and vitals reviewed. HENT:  Head: Normocephalic.  Eyes: Pupils are equal, round, and reactive to light.  Neck: Normal range of motion.  Cardiovascular: Regular rhythm, normal heart sounds and intact distal pulses.   Abdominal: Soft. Bowel sounds are normal.  Musculoskeletal: Normal range of motion. He exhibits no edema and no tenderness.  Neurological: He is alert.       Oriented x 2  Skin: Skin  is warm and dry.  Psychiatric: He has a normal mood and affect.    ED Course  Procedures (including critical care time)  Labs Reviewed  CBC - Abnormal; Notable for the following:    RBC 3.75 (*)    Hemoglobin 11.7 (*)    HCT 35.6 (*)    Platelets 126 (*)    All other components within normal limits  COMPREHENSIVE METABOLIC PANEL - Abnormal; Notable for the following:    Glucose, Bld 203 (*)    AST 83 (*)    All other components within normal limits  GLUCOSE, CAPILLARY - Abnormal; Notable for the following:    Glucose-Capillary 183 (*)    All other components within normal limits  DIFFERENTIAL  POCT I-STAT TROPONIN I  I-STAT TROPONIN I   Ct Head Wo Contrast  06/03/2011  *RADIOLOGY REPORT*  Clinical Data: C year.  CT HEAD WITHOUT CONTRAST  Technique:  Contiguous axial images were obtained from the base of the skull through the vertex without contrast.  Comparison: Head CT 12/28/2010.  Findings: There is stable age advanced cerebral atrophy.  The ventricles are in the midline without mass effect or shift.  No extra-axial fluid collections are identified.  No CT findings for acute hemispheric infarction and/or intracranial hemorrhage. Stable benign appearing bilateral basal ganglia calcifications. The brainstem and cerebellum appear normal.  No mass lesions.  The bony structures are intact.  The paranasal sinuses and mastoid air cells are clear.  IMPRESSION:  1.  Stable age advanced cerebral atrophy. 2.  No acute intracranial findings or mass lesions.  Original Report Authenticated By: P. Loralie Champagne, M.D.     1. Seizures       MDM   Date: 06/03/2011  Rate: 96  Rhythm: normal sinus rhythm  QRS Axis: normal  Intervals: normal  ST/T Wave abnormalities: normal  Conduction Disutrbances:none  Narrative Interpretation:   Old EKG Reviewed: unchanged   Here for evaluation of new onset seizure activity.  Had a witnessed seizure at home and another seizure in the ER.  ETOH  drinker but has not drank x 2 days.  Wife indicates that he does not drink that much but patient stated otherwise prior to her arrival.  CT head unremarkable.  Ativan and dilantin given in the ER. Abnormal Labs as follows: Labs Reviewed  CBC - Abnormal; Notable for the following:    RBC 3.75 (*)    Hemoglobin 11.7 (*)    HCT 35.6 (*)    Platelets 126 (*)    All other components within normal limits  COMPREHENSIVE METABOLIC PANEL - Abnormal; Notable for the following:    Glucose, Bld 203 (*)    AST 83 (*)    All other components within normal limits  GLUCOSE, CAPILLARY - Abnormal; Notable for the following:    Glucose-Capillary 183 (*)  All other components within normal limits  DIFFERENTIAL  POCT I-STAT TROPONIN I  I-STAT TROPONIN I    Will be admitted per Dr. Jerelyn Scott Dr. Darnelle Catalan hospitalists.      Jethro Bastos, NP 06/03/11 [phone]

## 2011-06-03 NOTE — ED Notes (Signed)
Patient is resting comfortably. Family at bedside. Reports feeling well.

## 2011-06-03 NOTE — ED Notes (Signed)
MD at bedside. 

## 2011-06-03 NOTE — ED Notes (Signed)
Nursing has called report to [phone]. Pt transported upstairs.

## 2011-06-03 NOTE — ED Notes (Signed)
Pt awoken wife with seizure activity. Post ictal upon EMS arrival; confused; drooling; currently A&Ox3 and back to baseline. Pt has no hx of seizure activity. BCG 158.

## 2011-06-04 ENCOUNTER — Inpatient Hospital Stay (HOSPITAL_COMMUNITY): Payer: Medicare Other

## 2011-06-04 LAB — CARDIAC PANEL(CRET KIN+CKTOT+MB+TROPI)
Relative Index: 0.6 (ref 0.0–2.5)
Relative Index: 0.4 (ref 0.0–2.5)
Total CK: 1976 U/L — ABNORMAL HIGH (ref 7–232)
Total CK: 2257 U/L — ABNORMAL HIGH (ref 7–232)
Troponin I: <0.3 ng/mL (ref <0.30)

## 2011-06-04 LAB — URINE CULTURE
Colony Count: 50000
Culture  Setup Time: 201212101640

## 2011-06-04 LAB — GLUCOSE, CAPILLARY: Glucose-Capillary: 117 mg/dL — ABNORMAL HIGH (ref 70–99)

## 2011-06-04 LAB — PHOSPHORUS: Phosphorus: 2.6 mg/dL (ref 2.3–4.6)

## 2011-06-04 LAB — PHENYTOIN LEVEL, TOTAL: Phenytoin Lvl: 14.2 ug/mL (ref 10.0–20.0)

## 2011-06-04 MED ORDER — GADOBENATE DIMEGLUMINE 529 MG/ML IV SOLN
15.0000 mL | Freq: Once | INTRAVENOUS | Status: AC
  Administered 2011-06-04: 15 mL via INTRAVENOUS

## 2011-06-04 NOTE — Consult Note (Signed)
Reason for Consult:New onset seizure Referring Physician: Marden Noble, MD  CC: Seizure  HPI: Joseph Copeland is an 70 y.o. male with pmh significant for diabetes type II and HTN was noted to have jerking movements with frothing in the mouth by his wife when they woke up yesterday morning (06/03/2011). Patient would not respond to the wife, who immediately called 911. EMS felt he had seizures. This is the first such episode as he has never had a seizures. Patient denied any prodromal symptoms and had been doing well day prior. The episode lasted 3-5 minutes and stopped spontaneously. No head trauma noted. Patient was moving all his 4 limbs. The wife could not comment on the eyes. He drinks alcohol but this is just on occasions. His last drink was last week. Patient denies headache, neck stiffness, fever or diarrhea. No illicit drugs. Patient had another seizure in the ED which was controlled with ativan. He had tongue bite in the ED. Wife has noticed decreased appetite since last 6 months and patient has also lost about 10 pounds(undocumented) in last 6 months. No change in bowel movements, abdominal pain, constipation or change in urinary habits noted. Patient was diagnosed with diabetes type II 6 moths ago which is controlled with diet. Patient also reports chronic back pain for which he gets steroid injections in his back. The spinal/paraspinal injection was about 6 months ago.  Investigations in ED show normal CT brain/CXR/EKG. Patient was given ativan and loaded with phenytoin in ED.   Past Medical History  Diagnosis Date  . Shortness of breath   . Seizures   . Hypertension   . Arthritis   . Diabetes mellitus     type 2    Past Surgical History  Procedure Date  . Hemrhoidectomy     History reviewed. No pertinent family history.  Social History:  reports that he has quit smoking. He has never used smokeless tobacco. He reports that he drinks about 12.6 ounces of alcohol per week. He  reports that he does not use illicit drugs.  No Known Allergies  Medications: I have reviewed the patient's current medications.  ROS: As per HPI  Blood pressure 138/87, pulse 84, temperature 99.4 F (37.4 C), temperature source Oral, resp. rate 17, SpO2 99.00%.  Neurologic Examination:  Mental Status:  Alert, oriented, thought content appropriate. Speech fluent without evidence of aphasia. Able to follow 3 step commands without difficulty. Although fully oriented forgets the answers to questions such as if he is being admitted almost immediately. Forgets who has been in the room.  Cranial Nerves:  II: visual fields grossly normal, pupils equal, round, reactive to light and accommodation  III,IV, VI: ptosis not present, extraocular muscles extra-ocular motions intact bilaterally  V,VII: smile symmetric, facial light touch sensation normal bilaterally  VIII: hearing normal bilaterally  IX,X: gag reflex present  XI: trapezius strength/neck flexion strength normal bilaterally  XII: tongue strength normal  Motor:  Right : Upper extremity 5/5 Left: Upper extremity 5/5  Lower extremity 5/5 Lower extremity 5/5  Tone and bulk:normal tone throughout; no atrophy noted  Sensory: Pinprick and light touch intact throughout, bilaterally  Deep Tendon Reflexes: 2+ and symmetric throughout  Plantars:  Right: downgoing Left: downgoing  Cerebellar:  normal finger-to-nose, normal rapid alternating movements and normal heel-to-shin test    Results for orders placed during the hospital encounter of 06/03/11 (from the past 48 hour(s))  CBC     Status: Abnormal   Collection Time   06/03/11  6:46 AM      Component Value Range Comment   WBC 5.8  4.0 - 10.5 (K/uL)    RBC 3.75 (*) 4.22 - 5.81 (MIL/uL)    Hemoglobin 11.7 (*) 13.0 - 17.0 (g/dL)    HCT 16.1 (*) 09.6 - 52.0 (%)    MCV 94.9  78.0 - 100.0 (fL)    MCH 31.2  26.0 - 34.0 (pg)    MCHC 32.9  30.0 - 36.0 (g/dL)    RDW 04.5  40.9 - 81.1 (%)      Platelets 126 (*) 150 - 400 (K/uL)   DIFFERENTIAL     Status: Normal   Collection Time   06/03/11  6:46 AM      Component Value Range Comment   Neutrophils Relative 76  43 - 77 (%)    Neutro Abs 4.4  1.7 - 7.7 (K/uL)    Lymphocytes Relative 15  12 - 46 (%)    Lymphs Abs 0.9  0.7 - 4.0 (K/uL)    Monocytes Relative 9  3 - 12 (%)    Monocytes Absolute 0.5  0.1 - 1.0 (K/uL)    Eosinophils Relative 0  0 - 5 (%)    Eosinophils Absolute 0.0  0.0 - 0.7 (K/uL)    Basophils Relative 0  0 - 1 (%)    Basophils Absolute 0.0  0.0 - 0.1 (K/uL)   COMPREHENSIVE METABOLIC PANEL     Status: Abnormal   Collection Time   06/03/11  6:46 AM      Component Value Range Comment   Sodium 137  135 - 145 (mEq/L)    Potassium 4.0  3.5 - 5.1 (mEq/L)    Chloride 99  96 - 112 (mEq/L)    CO2 19  19 - 32 (mEq/L)    Glucose, Bld 203 (*) 70 - 99 (mg/dL)    BUN 8  6 - 23 (mg/dL)    Creatinine, Ser 9.14  0.50 - 1.35 (mg/dL)    Calcium 8.7  8.4 - 10.5 (mg/dL)    Total Protein 7.4  6.0 - 8.3 (g/dL)    Albumin 3.6  3.5 - 5.2 (g/dL)    AST 83 (*) 0 - 37 (U/L)    ALT 51  0 - 53 (U/L)    Alkaline Phosphatase 90  39 - 117 (U/L)    Total Bilirubin 0.6  0.3 - 1.2 (mg/dL)    GFR calc non Af Amer >90  >90 (mL/min)    GFR calc Af Amer >90  >90 (mL/min)   POCT I-STAT TROPONIN I     Status: Normal   Collection Time   06/03/11  6:51 AM      Component Value Range Comment   Troponin i, poc 0.00  0.00 - 0.08 (ng/mL)    Comment 3            GLUCOSE, CAPILLARY     Status: Abnormal   Collection Time   06/03/11  8:15 AM      Component Value Range Comment   Glucose-Capillary 183 (*) 70 - 99 (mg/dL)   GLUCOSE, CAPILLARY     Status: Abnormal   Collection Time   06/03/11 12:04 PM      Component Value Range Comment   Glucose-Capillary 115 (*) 70 - 99 (mg/dL)   URINALYSIS, ROUTINE W REFLEX MICROSCOPIC     Status: Normal   Collection Time   06/03/11  3:32 PM      Component Value Range Comment  Color, Urine YELLOW  YELLOW      APPearance CLEAR  CLEAR     Specific Gravity, Urine 1.007  1.005 - 1.030     pH 7.5  5.0 - 8.0     Glucose, UA NEGATIVE  NEGATIVE (mg/dL)    Hgb urine dipstick NEGATIVE  NEGATIVE     Bilirubin Urine NEGATIVE  NEGATIVE     Ketones, ur NEGATIVE  NEGATIVE (mg/dL)    Protein, ur NEGATIVE  NEGATIVE (mg/dL)    Urobilinogen, UA 0.2  0.0 - 1.0 (mg/dL)    Nitrite NEGATIVE  NEGATIVE     Leukocytes, UA NEGATIVE  NEGATIVE  MICROSCOPIC NOT DONE ON URINES WITH NEGATIVE PROTEIN, BLOOD, LEUKOCYTES, NITRITE, OR GLUCOSE <1000 mg/dL.  MAGNESIUM     Status: Abnormal   Collection Time   06/03/11  4:11 PM      Component Value Range Comment   Magnesium 1.4 (*) 1.5 - 2.5 (mg/dL)   PHOSPHORUS     Status: Abnormal   Collection Time   06/03/11  4:11 PM      Component Value Range Comment   Phosphorus 1.1 (*) 2.3 - 4.6 (mg/dL)   TSH     Status: Normal   Collection Time   06/03/11  4:11 PM      Component Value Range Comment   TSH 0.889  0.350 - 4.500 (uIU/mL)   PROTIME-INR     Status: Normal   Collection Time   06/03/11  4:11 PM      Component Value Range Comment   Prothrombin Time 13.7  11.6 - 15.2 (seconds)    INR 1.03  0.00 - 1.49    APTT     Status: Normal   Collection Time   06/03/11  4:11 PM      Component Value Range Comment   aPTT 31  24 - 37 (seconds)   VITAMIN B12     Status: Abnormal   Collection Time   06/03/11  4:11 PM      Component Value Range Comment   Vitamin B-12 1594 (*) 211 - 911 (pg/mL)   FOLATE     Status: Normal   Collection Time   06/03/11  4:11 PM      Component Value Range Comment   Folate >20.0     IRON AND TIBC     Status: Normal   Collection Time   06/03/11  4:11 PM      Component Value Range Comment   Iron 130  42 - 135 (ug/dL)    TIBC 782  956 - 213 (ug/dL)    Saturation Ratios 37  20 - 55 (%)    UIBC 226  125 - 400 (ug/dL)   FERRITIN     Status: Normal   Collection Time   06/03/11  4:11 PM      Component Value Range Comment   Ferritin 71  22 - 322  (ng/mL)   RETICULOCYTES     Status: Abnormal   Collection Time   06/03/11  4:11 PM      Component Value Range Comment   Retic Ct Pct 1.2  0.4 - 3.1 (%)    RBC. 3.83 (*) 4.22 - 5.81 (MIL/uL)    Retic Count, Manual 46.0  19.0 - 186.0 (K/uL)   HIV ANTIBODY (ROUTINE TESTING)     Status: Normal   Collection Time   06/03/11  4:11 PM      Component Value Range Comment   HIV NON REACTIVE  NON REACTIVE    RPR     Status: Normal   Collection Time   06/03/11  4:11 PM      Component Value Range Comment   RPR NON REACTIVE  NON REACTIVE    GLUCOSE, CAPILLARY     Status: Abnormal   Collection Time   06/03/11  4:49 PM      Component Value Range Comment   Glucose-Capillary 125 (*) 70 - 99 (mg/dL)   CARDIAC PANEL(CRET KIN+CKTOT+MB+TROPI)     Status: Abnormal   Collection Time   06/03/11  8:12 PM      Component Value Range Comment   Total CK 1887 (*) 7 - 232 (U/L)    CK, MB 13.9 (*) 0.3 - 4.0 (ng/mL)    Troponin I <0.30  <0.30 (ng/mL)    Relative Index 0.7  0.0 - 2.5    PHENYTOIN LEVEL, TOTAL     Status: Normal   Collection Time   06/04/11  3:21 AM      Component Value Range Comment   Phenytoin Lvl 14.2  10.0 - 20.0 (ug/mL)   MAGNESIUM     Status: Normal   Collection Time   06/04/11  3:21 AM      Component Value Range Comment   Magnesium 2.0  1.5 - 2.5 (mg/dL)   PHOSPHORUS     Status: Normal   Collection Time   06/04/11  3:21 AM      Component Value Range Comment   Phosphorus 2.6  2.3 - 4.6 (mg/dL)   CARDIAC PANEL(CRET KIN+CKTOT+MB+TROPI)     Status: Abnormal   Collection Time   06/04/11  3:21 AM      Component Value Range Comment   Total CK 1976 (*) 7 - 232 (U/L)    CK, MB 12.1 (*) 0.3 - 4.0 (ng/mL) CRITICAL VALUE NOTED.  VALUE IS CONSISTENT WITH PREVIOUSLY REPORTED AND CALLED VALUE.   Troponin I <0.30  <0.30 (ng/mL)    Relative Index 0.6  0.0 - 2.5      Ct Head Wo Contrast  06/03/2011  *RADIOLOGY REPORT*  Clinical Data: C year.  CT HEAD WITHOUT CONTRAST  Technique:   Contiguous axial images were obtained from the base of the skull through the vertex without contrast.  Comparison: Head CT 12/28/2010.  Findings: There is stable age advanced cerebral atrophy.  The ventricles are in the midline without mass effect or shift.  No extra-axial fluid collections are identified.  No CT findings for acute hemispheric infarction and/or intracranial hemorrhage. Stable benign appearing bilateral basal ganglia calcifications. The brainstem and cerebellum appear normal.  No mass lesions.  The bony structures are intact.  The paranasal sinuses and mastoid air cells are clear.  IMPRESSION:  1.  Stable age advanced cerebral atrophy. 2.  No acute intracranial findings or mass lesions.  Original Report Authenticated By: P. Loralie Champagne, M.D.   Mr Joseph Copeland Wo Contrast  06/04/2011  *RADIOLOGY REPORT*  Clinical Data: New onset seizures.  MRI HEAD WITHOUT AND WITH CONTRAST  Technique:  Multiplanar, multiecho pulse sequences of the brain and surrounding structures were obtained according to standard protocol without and with intravenous contrast  Contrast: 15mL MULTIHANCE GADOBENATE DIMEGLUMINE 529 MG/ML IV SOLN  Comparison: CT head 06/03/2011.  MR head 12/28/2010.  Findings: The patient was moderately uncooperative and could not remain motionless for the study.  Images are suboptimal.  Small or subtle lesions could be overlooked.  There is no evidence for acute infarction, intracranial hemorrhage, mass  lesion, hydrocephalus, or extra-axial fluid.   Moderate to severe atrophy is present considering the patient's age of 50. Chronic microvascular ischemic change is present in the periventricular and subcortical white matter.  Major intracranial vascular structures appear patent based on flow void.  There is no midline shift.  Motion degraded thin section images through the temporal lobes demonstrate no gross asymmetry. No pituitary or cerebellar abnormality.  There is no acute sinus or mastoid  disease. Compared with prior studies, there is little change.  IMPRESSION: Atrophy and small vessel disease is similar to priors. No acute intracranial findings.  Motion degraded coronal images of the temporal lobes demonstrate no definite seizure focus.  Original Report Authenticated By: Elsie Stain, M.D.   US Abdomen Complete  06/04/2011  *RADIOLOGY REPORT*  Clinical Data:  Evaluate liver.  Ascites.  Elevated liver function tests.  COMPLETE ABDOMINAL ULTRASOUND  Comparison:  CT of 01/05/2009.  Findings:  Gallbladder:  Normal, without wall thickening, stone, or pericholecystic fluid.  Sonographic Kalis's sign was not elicited.  Common bile duct: Normal, 4 mm.  Liver: Increasing echogenicity.  A minimally irregular lesion in the right lobe of the liver measures 1.9 cm on image 84.  Anechoic.  IVC: Negative  Pancreas:  Negative  Spleen:  Normal in size and echogenicity.  Right Kidney:  10.3 cm. No hydronephrosis.  Left Kidney:  10.5 cm. No hydronephrosis.  Interpolar left renal cyst measures 1.6 cm.  Abdominal aorta:  Nonaneurysmal without ascites.  IMPRESSION:  1.  Hepatic steatosis. 2.  No other explanation for elevated liver function tests. 3.  Right hepatic minimally irregular lesion likely corresponds to a complex cyst on 01/05/2009 CT.  Original Report Authenticated By: Consuello Bossier, M.D.   Portable Chest 1 View  06/03/2011  *RADIOLOGY REPORT*  Clinical Data: New onset seizure.  Question pneumonia.  PORTABLE CHEST - 1 VIEW  Comparison: 12/28/2010 and 01/03/2009 radiographs.  Findings: 1618 hours.  There is mild patient rotation to the right. Heart size and mediastinal contours are stable.  The lungs are clear.  There is no pleural effusion or pneumothorax.  Telemetry leads overlie the chest.  IMPRESSION: No active cardiopulmonary process.  Original Report Authenticated By: Gerrianne Scale, M.D.     Assessment/Plan:  Patient Active Hospital Problem List: Seizures, generalized convulsive  (06/03/2011)   Assessment: The cause is unknown at this time with wide possibilities. Space occupying lesion or infection is possible but less likely. Plan: MRI brain, EEG and continue on dilantin. Dilantin levels in range.  Case discussed with Dr. Lyman Speller in detail.  Lars Mage MD [facility] Resident Pager [phone]  06/04/2011, 10:27 AM   Addendum: Patient's EEG showed intermittent right temporal slowing, but no obvious epileptiform activity. Cont. Dilantin at 300 mg PO daily and follow-up with outpatient neurology.   Carmell Austria

## 2011-06-04 NOTE — Progress Notes (Signed)
Inpatient Diabetes Program Recommendations  AACE/ADA: New Consensus Statement on Inpatient Glycemic Control (2009)  Target Ranges:  Prepandial:   less than 140 mg/dL      Peak postprandial:   less than 180 mg/dL (1-2 hours)      Critically ill patients:  140 - 180 mg/dL   Reason for Visit: Note history of diabetes.  No home medications listed for diabetes.  Inpatient Diabetes Program Recommendations Correction (SSI): Please order CBG's tid wc and HS with senstive Novolog correction. HgbA1C: A1C ordered.  Note:Will follow.

## 2011-06-04 NOTE — Progress Notes (Signed)
Subjective: Joseph Copeland has new-onset seizures. No documented hypoglycemia. Has not been using any significant alcohol as of late. Father had a seizure disorder but was also an alcoholic. MRI is pending this morning and we'll also seek neurological consultation  Objective: Weight change:   Intake/Output Summary (Last 24 hours) at 06/04/11 0806 Last data filed at 06/04/11 0500  Gross per 24 hour  Intake    120 ml  Output    802 ml  Net   -682 ml    General Appearance: Alert, cooperative, no distress, appears stated age Head: Normocephalic, without obvious abnormality, atraumatic Neck: Supple, symmetrical Lungs: Clear to auscultation bilaterally, respirations unlabored Heart: Regular rate and rhythm, S1 and S2 normal, no murmur, rub or gallop Abdomen: Soft, non-tender, bowel sounds active all four quadrants, no masses, no organomegaly Extremities: Extremities normal, atraumatic, no cyanosis or edema Pulses: 2+ and symmetric all extremities Skin: Skin color, texture, turgor normal, no rashes or lesions Neuro: CNII-XII intact. Normal strength, sensation and reflexes throughout   Lab Results:  Basename 06/04/11 0321 06/03/11 1611 06/03/11 0646  NA -- -- 137  K -- -- 4.0  CL -- -- 99  CO2 -- -- 19  GLUCOSE -- -- 203*  BUN -- -- 8  CREATININE -- -- 0.68  CALCIUM -- -- 8.7  MG 2.0 1.4* --  PHOS 2.6 1.1* --    Basename 06/03/11 0646  AST 83*  ALT 51  ALKPHOS 90  BILITOT 0.6  PROT 7.4  ALBUMIN 3.6   No results found for this basename: LIPASE:2,AMYLASE:2 in the last 72 hours  Basename 06/03/11 0646  WBC 5.8  NEUTROABS 4.4  HGB 11.7*  HCT 35.6*  MCV 94.9  PLT 126*    Basename 06/04/11 0321 06/03/11 2012  CKTOTAL 1976* 1887*  CKMB 12.1* 13.9*  CKMBINDEX -- --  TROPONINI <0.30 <0.30   No components found with this basename: POCBNP:3 No results found for this basename: DDIMER:2 in the last 72 hours No results found for this basename: HGBA1C:2 in the last 72  hours No results found for this basename: CHOL:2,HDL:2,LDLCALC:2,TRIG:2,CHOLHDL:2,LDLDIRECT:2 in the last 72 hours  Basename 06/03/11 1611  TSH 0.889  T4TOTAL --  T3FREE --  THYROIDAB --    Basename 06/03/11 1611  VITAMINB12 1594*  FOLATE >20.0  FERRITIN 71  TIBC 356  IRON 130  RETICCTPCT 1.2    Studies/Results: Ct Head Wo Contrast  06/03/2011  *RADIOLOGY REPORT*  Clinical Data: C year.  CT HEAD WITHOUT CONTRAST  Technique:  Contiguous axial images were obtained from the base of the skull through the vertex without contrast.  Comparison: Head CT 12/28/2010.  Findings: There is stable age advanced cerebral atrophy.  The ventricles are in the midline without mass effect or shift.  No extra-axial fluid collections are identified.  No CT findings for acute hemispheric infarction and/or intracranial hemorrhage. Stable benign appearing bilateral basal ganglia calcifications. The brainstem and cerebellum appear normal.  No mass lesions.  The bony structures are intact.  The paranasal sinuses and mastoid air cells are clear.  IMPRESSION:  1.  Stable age advanced cerebral atrophy. 2.  No acute intracranial findings or mass lesions.  Original Report Authenticated By: P. Loralie Champagne, M.D.   Portable Chest 1 View  06/03/2011  *RADIOLOGY REPORT*  Clinical Data: New onset seizure.  Question pneumonia.  PORTABLE CHEST - 1 VIEW  Comparison: 12/28/2010 and 01/03/2009 radiographs.  Findings: 1618 hours.  There is mild patient rotation to the right. Heart size and  mediastinal contours are stable.  The lungs are clear.  There is no pleural effusion or pneumothorax.  Telemetry leads overlie the chest.  IMPRESSION: No active cardiopulmonary process.  Original Report Authenticated By: Gerrianne Scale, M.D.   Medications: Scheduled Meds:   . sodium chloride   Intravenous STAT  . docusate sodium  100 mg Oral BID  . folic acid  1 mg Oral Daily  . gadobenate dimeglumine  15 mL Intravenous Once  .  LORazepam      . magnesium oxide  400 mg Oral BID  . magnesium sulfate IVPB  2 g Intravenous Once  . metoprolol tartrate  12.5 mg Oral BID  . multivitamins ther. w/minerals  1 tablet Oral Daily  . omega-3 acid ethyl esters  2 g Oral Daily  . omega-3 acid ethyl esters  2 g Oral Daily  . pantoprazole  40 mg Oral Q1200  . phenytoin (DILANTIN) IV  800 mg Intravenous Once  . phenytoin  100 mg Oral TID  . potassium phosphate IVPB (mmol)  15 mmol Intravenous Once  . thiamine  100 mg Oral Daily  . white petrolatum      . DISCONTD: fish oil-omega-3 fatty acids  2 g Oral Daily   Continuous Infusions:  PRN Meds:.acetaminophen, acetaminophen, LORazepam, morphine, ondansetron (ZOFRAN) IV, ondansetron, oxyCODONE  Assessment/Plan: Patient Active Problem List  Diagnoses Date Noted  . Seizures, generalized convulsive - new onset seizure disorder. Rule out stroke or neoplasm by MRI and continue Dilantin. Will ask for neurological consultation  06/03/2011  . HTN (hypertension) - relatively well controlled  06/03/2011  . DM (diabetes mellitus), type 2 - check hemoglobin A1c. Last hemoglobin A1c was 9.3% in July, 2012  06/03/2011  . Anemia - borderline. Workup negative  06/03/2011  . Thrombocytopenia - mild decrease in platelets at 126,000  06/03/2011     LOS: 1 day   Matisyn Cabeza NEVILL 06/04/2011, 8:06 AM

## 2011-06-04 NOTE — Progress Notes (Signed)
Lab called around 2125, 06/03/11  regarding a critical CKMB value of 13.9. Dr. Betti Cruz made aware. No orders given.  Salvadore Oxford, RN 06/04/11 [phone]

## 2011-06-05 MED ORDER — PHENOL 1.4 % MT LIQD: 1.0000 | OROMUCOSAL | Status: DC | PRN

## 2011-06-05 MED ORDER — PHENYTOIN 50 MG PO CHEW: 100.0000 mg | CHEWABLE_TABLET | Freq: Every day | ORAL | Status: DC

## 2011-06-05 MED ORDER — METOPROLOL TARTRATE 12.5 MG HALF TABLET: 25.0000 mg | ORAL_TABLET | Freq: Two times a day (BID) | ORAL | Status: DC

## 2011-06-05 NOTE — Discharge Summary (Signed)
Physician Discharge Summary  NAME:Joseph Copeland  ZOX:[id number]  DOB: 1940-09-27   Admit date: 06/03/2011 Discharge date: 06/05/2011  Discharge Diagnoses:  Principal Problem:  Seizures, generalized convulsive  - controlled thus far on Dilantin, new onset.  EEG without obvious epileptiform activity. MRI of brain with generalized atrophy and small vessel disease Active Problems:  HTN (hypertension)   DM (diabetes mellitus), type 2 - not well controlled  Anemia - normocytic normochromic with normal iron B12 and folate levels  Thrombocytopenia - mild  Sherore throat with negative rapid strep test, and June 05, 2011  Discharge Condition: improved  Hospital Course:  [name] was admitted to [facility] on June 03, 2011 4 tonic-clonic activity that involved his head and upper extremity. He also had loss of consciousness during this time. He never had a seizure before. Blood sugar at that time was measured and was normal to slightly elevated. No hypoglycemia documented. No reports of recent head trauma and no significant alcohol use in many months. He had seizure activity at home and also documented in the [facility] emergency room.  He was started on Dilantin and had no further seizure activity after being loaded with intravenous Dilantin. Dilantin level was therapeutic on June 04, 2011. MRI of the brain revealed diffuse atrophy and small vessel disease but no other obvious abnormalities. No acute or prior strokes. EEG showed no definite epileptiform activity.  The patient was seen in consultation by neurology who recommended that patient be discharged on Dilantin to be followed up as an outpatient. No driving for now. Other problems include significant brain atrophy on MRI with change in patient's affect and drive and mild changes in cognition as noted by family over the past year. He also has type 2 diabetes mellitus hemoglobin A1c was normal at 6.5%,  relatively. Blood pressure was adequately controlled during the admission. He was also found to be mildly anemic with a hemoglobin in the 11 range, but it was a normal chromic normocytic nonspecific anemia. Slightly low platelet count as well.  The patient experienced a sore throat on the day of discharge and rapid strep was negative. He was not febrile.  He will use Chloraseptic spray for now as an outpatient as needed     Consults: Treatment Team:  Neuro1 [facility]  Disposition: Home or Self Care  Discharge Orders    Future Orders Please Complete By Expires   Diet - low sodium heart healthy      Increase activity slowly      Discharge instructions      Comments:   Discharge to home. Light activity until I see you next week. We will plan to schedule followup with the neurologist next week during our visit at my office. 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 No driving for now and until the neurologist says it is okay. Call doctor if not feeling well or if recurrent seizure occurs     Current Discharge Medication List    START taking these medications   Details  metoprolol tartrate (LOPRESSOR) 12.5 mg TABS Take 1 tablet (25 mg total) by mouth 2 (two) times daily. Qty: 60 tablet, Refills: 5    phenytoin (DILANTIN) 50 MG tablet Chew 2 tablets (100 mg total) by mouth at bedtime. Qty: 90 tablet, Refills: 5      CONTINUE these medications which have NOT CHANGED   Details  CALCIUM CARBONATE PO Take 1 tablet by mouth daily.  Cholecalciferol (VITAMIN D PO) Take 1 tablet by mouth daily.      Cyanocobalamin (VITAMIN B 12 PO) Take 1 tablet by mouth daily.      fish oil-omega-3 fatty acids 1000 MG capsule Take 2 g by mouth daily.      Multiple Vitamins-Minerals (MULTIVITAMINS THER. W/MINERALS) TABS Take 1 tablet by mouth daily.        STOP taking these medications     VITAMIN A PO        Follow-up Information    Follow up with Jersie Beel  NEVILL, MD in 1 week. (Call [phone] for Appointment)    Contact information:   [address] [employer] [place] [address] [phone]          Things to follow up in the outpatient setting: notify M.D. If seizures occur  Time coordinating discharge: 40 minutes  The results of significant diagnostics from this hospitalization (including imaging, microbiology, ancillary and laboratory) are listed below for reference.    Significant Diagnostic Studies: Ct Head Wo Contrast  06/03/2011  *RADIOLOGY REPORT*  Clinical Data: C year.  CT HEAD WITHOUT CONTRAST  Technique:  Contiguous axial images were obtained from the base of the skull through the vertex without contrast.  Comparison: Head CT 12/28/2010.  Findings: There is stable age advanced cerebral atrophy.  The ventricles are in the midline without mass effect or shift.  No extra-axial fluid collections are identified.  No CT findings for acute hemispheric infarction and/or intracranial hemorrhage. Stable benign appearing bilateral basal ganglia calcifications. The brainstem and cerebellum appear normal.  No mass lesions.  The bony structures are intact.  The paranasal sinuses and mastoid air cells are clear.  IMPRESSION:  1.  Stable age advanced cerebral atrophy. 2.  No acute intracranial findings or mass lesions.  Original Report Authenticated By: P. Loralie Champagne, M.D.   Mr Laqueta Jean Wo Contrast  06/04/2011  *RADIOLOGY REPORT*  Clinical Data: New onset seizures.  MRI HEAD WITHOUT AND WITH CONTRAST  Technique:  Multiplanar, multiecho pulse sequences of the brain and surrounding structures were obtained according to standard protocol without and with intravenous contrast  Contrast: 15mL MULTIHANCE GADOBENATE DIMEGLUMINE 529 MG/ML IV SOLN  Comparison: CT head 06/03/2011.  MR head 12/28/2010.  Findings: The patient was moderately uncooperative and could not remain motionless for the study.   Images are suboptimal.  Small or subtle lesions could be overlooked.  There is no evidence for acute infarction, intracranial hemorrhage, mass lesion, hydrocephalus, or extra-axial fluid.   Moderate to severe atrophy is present considering the patient's age of 76. Chronic microvascular ischemic change is present in the periventricular and subcortical white matter.  Major intracranial vascular structures appear patent based on flow void.  There is no midline shift.  Motion degraded thin section images through the temporal lobes demonstrate no gross asymmetry. No pituitary or cerebellar abnormality.  There is no acute sinus or mastoid disease. Compared with prior studies, there is little change.  IMPRESSION: Atrophy and small vessel disease is similar to priors. No acute intracranial findings.  Motion degraded coronal images of the temporal lobes demonstrate no definite seizure focus.  Original Report Authenticated By: Elsie Stain, M.D.   US Abdomen Complete  06/04/2011  *RADIOLOGY REPORT*  Clinical Data:  Evaluate liver.  Ascites.  Elevated liver function tests.  COMPLETE ABDOMINAL ULTRASOUND  Comparison:  CT of 01/05/2009.  Findings:  Gallbladder:  Normal, without wall thickening, stone, or pericholecystic  fluid.  Sonographic Louth's sign was not elicited.  Common bile duct: Normal, 4 mm.  Liver: Increasing echogenicity.  A minimally irregular lesion in the right lobe of the liver measures 1.9 cm on image 84.  Anechoic.  IVC: Negative  Pancreas:  Negative  Spleen:  Normal in size and echogenicity.  Right Kidney:  10.3 cm. No hydronephrosis.  Left Kidney:  10.5 cm. No hydronephrosis.  Interpolar left renal cyst measures 1.6 cm.  Abdominal aorta:  Nonaneurysmal without ascites.  IMPRESSION:  1.  Hepatic steatosis. 2.  No other explanation for elevated liver function tests. 3.  Right hepatic minimally irregular lesion likely corresponds to a complex cyst on 01/05/2009 CT.  Original Report Authenticated By: Consuello Bossier, M.D.   Portable Chest 1 View  06/03/2011  *RADIOLOGY REPORT*  Clinical Data: New onset seizure.  Question pneumonia.  PORTABLE CHEST - 1 VIEW  Comparison: 12/28/2010 and 01/03/2009 radiographs.  Findings: 1618 hours.  There is mild patient rotation to the right. Heart size and mediastinal contours are stable.  The lungs are clear.  There is no pleural effusion or pneumothorax.  Telemetry leads overlie the chest.  IMPRESSION: No active cardiopulmonary process.  Original Report Authenticated By: Gerrianne Scale, M.D.    Microbiology: Recent Results (from the past 240 hour(s))  URINE CULTURE     Status: Normal   Collection Time   06/03/11  3:32 PM      Component Value Range Status Comment   Specimen Description URINE, CLEAN CATCH   Final    Special Requests NONE   Final    Setup Time [id number]   Final    Colony Count 50,000 COLONIES/ML   Final    Culture     Final    Value: Multiple bacterial morphotypes present, none predominant. Suggest appropriate recollection if clinically indicated.   Report Status 06/04/2011 FINAL   Final   RAPID STREP SCREEN     Status: Normal   Collection Time   06/05/11  2:52 PM      Component Value Range Status Comment   Streptococcus, Group A Screen (Direct) NEGATIVE  NEGATIVE  Final      Labs: Results for orders placed during the hospital encounter of 06/03/11  CBC      Component Value Range   WBC 5.8  4.0 - 10.5 (K/uL)   RBC 3.75 (*) 4.22 - 5.81 (MIL/uL)   Hemoglobin 11.7 (*) 13.0 - 17.0 (g/dL)   HCT 78.2 (*) 95.6 - 52.0 (%)   MCV 94.9  78.0 - 100.0 (fL)   MCH 31.2  26.0 - 34.0 (pg)   MCHC 32.9  30.0 - 36.0 (g/dL)   RDW 21.3  08.6 - 57.8 (%)   Platelets 126 (*) 150 - 400 (K/uL)  DIFFERENTIAL      Component Value Range   Neutrophils Relative 76  43 - 77 (%)   Neutro Abs 4.4  1.7 - 7.7 (K/uL)   Lymphocytes Relative 15  12 - 46 (%)   Lymphs Abs 0.9  0.7 - 4.0 (K/uL)   Monocytes Relative 9  3 - 12 (%)   Monocytes Absolute 0.5  0.1  - 1.0 (K/uL)   Eosinophils Relative 0  0 - 5 (%)   Eosinophils Absolute 0.0  0.0 - 0.7 (K/uL)   Basophils Relative 0  0 - 1 (%)   Basophils Absolute 0.0  0.0 - 0.1 (K/uL)  COMPREHENSIVE METABOLIC PANEL      Component Value Range  Sodium 137  135 - 145 (mEq/L)   Potassium 4.0  3.5 - 5.1 (mEq/L)   Chloride 99  96 - 112 (mEq/L)   CO2 19  19 - 32 (mEq/L)   Glucose, Bld 203 (*) 70 - 99 (mg/dL)   BUN 8  6 - 23 (mg/dL)   Creatinine, Ser 1.30  0.50 - 1.35 (mg/dL)   Calcium 8.7  8.4 - 86.5 (mg/dL)   Total Protein 7.4  6.0 - 8.3 (g/dL)   Albumin 3.6  3.5 - 5.2 (g/dL)   AST 83 (*) 0 - 37 (U/L)   ALT 51  0 - 53 (U/L)   Alkaline Phosphatase 90  39 - 117 (U/L)   Total Bilirubin 0.6  0.3 - 1.2 (mg/dL)   GFR calc non Af Amer >90  >90 (mL/min)   GFR calc Af Amer >90  >90 (mL/min)  POCT I-STAT TROPONIN I      Component Value Range   Troponin i, poc 0.00  0.00 - 0.08 (ng/mL)   Comment 3           GLUCOSE, CAPILLARY      Component Value Range   Glucose-Capillary 183 (*) 70 - 99 (mg/dL)  GLUCOSE, CAPILLARY      Component Value Range   Glucose-Capillary 115 (*) 70 - 99 (mg/dL)  MAGNESIUM      Component Value Range   Magnesium 1.4 (*) 1.5 - 2.5 (mg/dL)  PHOSPHORUS      Component Value Range   Phosphorus 1.1 (*) 2.3 - 4.6 (mg/dL)  TSH      Component Value Range   TSH 0.889  0.350 - 4.500 (uIU/mL)  PROTIME-INR      Component Value Range   Prothrombin Time 13.7  11.6 - 15.2 (seconds)   INR 1.03  0.00 - 1.49   APTT      Component Value Range   aPTT 31  24 - 37 (seconds)  URINE CULTURE      Component Value Range   Specimen Description URINE, CLEAN CATCH     Special Requests NONE     Setup Time [id number]     Colony Count 50,000 COLONIES/ML     Culture       Value: Multiple bacterial morphotypes present, none predominant. Suggest appropriate recollection if clinically indicated.   Report Status 06/04/2011 FINAL    VITAMIN B12      Component Value Range   Vitamin B-12 1594 (*) 211 -  911 (pg/mL)  FOLATE      Component Value Range   Folate >20.0    IRON AND TIBC      Component Value Range   Iron 130  42 - 135 (ug/dL)   TIBC 132  440 - 102 (ug/dL)   Saturation Ratios 37  20 - 55 (%)   UIBC 226  125 - 400 (ug/dL)  FERRITIN      Component Value Range   Ferritin 71  22 - 322 (ng/mL)  RETICULOCYTES      Component Value Range   Retic Ct Pct 1.2  0.4 - 3.1 (%)   RBC. 3.83 (*) 4.22 - 5.81 (MIL/uL)   Retic Count, Manual 46.0  19.0 - 186.0 (K/uL)  HIV ANTIBODY (ROUTINE TESTING)      Component Value Range   HIV NON REACTIVE  NON REACTIVE   URINALYSIS, ROUTINE W REFLEX MICROSCOPIC      Component Value Range   Color, Urine YELLOW  YELLOW    APPearance CLEAR  CLEAR    Specific Gravity, Urine 1.007  1.005 - 1.030    pH 7.5  5.0 - 8.0    Glucose, UA NEGATIVE  NEGATIVE (mg/dL)   Hgb urine dipstick NEGATIVE  NEGATIVE    Bilirubin Urine NEGATIVE  NEGATIVE    Ketones, ur NEGATIVE  NEGATIVE (mg/dL)   Protein, ur NEGATIVE  NEGATIVE (mg/dL)   Urobilinogen, UA 0.2  0.0 - 1.0 (mg/dL)   Nitrite NEGATIVE  NEGATIVE    Leukocytes, UA NEGATIVE  NEGATIVE   RPR      Component Value Range   RPR NON REACTIVE  NON REACTIVE   GLUCOSE, CAPILLARY      Component Value Range   Glucose-Capillary 125 (*) 70 - 99 (mg/dL)  PHENYTOIN LEVEL, TOTAL      Component Value Range   Phenytoin Lvl 14.2  10.0 - 20.0 (ug/mL)  MAGNESIUM      Component Value Range   Magnesium 2.0  1.5 - 2.5 (mg/dL)  PHOSPHORUS      Component Value Range   Phosphorus 2.6  2.3 - 4.6 (mg/dL)  CARDIAC PANEL(CRET KIN+CKTOT+MB+TROPI)      Component Value Range   Total CK 1887 (*) 7 - 232 (U/L)   CK, MB 13.9 (*) 0.3 - 4.0 (ng/mL)   Troponin I <0.30  <0.30 (ng/mL)   Relative Index 0.7  0.0 - 2.5   CARDIAC PANEL(CRET KIN+CKTOT+MB+TROPI)      Component Value Range   Total CK 1976 (*) 7 - 232 (U/L)   CK, MB 12.1 (*) 0.3 - 4.0 (ng/mL)   Troponin I <0.30  <0.30 (ng/mL)   Relative Index 0.6  0.0 - 2.5   CARDIAC PANEL(CRET  KIN+CKTOT+MB+TROPI)      Component Value Range   Total CK 2257 (*) 7 - 232 (U/L)   CK, MB 9.6 (*) 0.3 - 4.0 (ng/mL)   Troponin I <0.30  <0.30 (ng/mL)   Relative Index 0.4  0.0 - 2.5   HEMOGLOBIN A1C      Component Value Range   Hemoglobin A1C 6.5 (*) <5.7 (%)   Mean Plasma Glucose 140 (*) <117 (mg/dL)  GLUCOSE, CAPILLARY      Component Value Range   Glucose-Capillary 193 (*) 70 - 99 (mg/dL)  GLUCOSE, CAPILLARY      Component Value Range   Glucose-Capillary 117 (*) 70 - 99 (mg/dL)  RAPID STREP SCREEN      Component Value Range   Streptococcus, Group A Screen (Direct) NEGATIVE  NEGATIVE      Signed: Chanika Byland NEVILL 06/05/2011, 6:29 PM

## 2011-06-05 NOTE — Progress Notes (Signed)
Sl removed from R FA, no bleeding noted, catheter tip intact.  Pt given d/c instructions along with f/u apt to be made with Dr. Kevan Ny.  Prescriptions called into pharmacy per Dr. Kevan Ny for lopressor and dilantin.  Pt d/c'd home via w/c accompanied by medical staff and wife.

## 2011-06-05 NOTE — Discharge Instructions (Addendum)
Driving and Equipment Restrictions Some medical problems make it dangerous to drive, ride a bike, or use machines. Some of these problems are:  A hard blow to the head (concussion).   Passing out (fainting).   Twitching and shaking (seizures).   Low blood sugar.   Taking medicine to help you relax (sedatives).   Taking pain medicines.   Wearing an eye patch.   Wearing splints. This can make it hard to use parts of your body that you need to drive safely.  HOME CARE   Do not drive until your doctor says it is okay.   Do not use machines until your doctor says it is okay.  You may need a form signed by your doctor (medical release) before you can drive again. You may also need this form before you do other tasks where you need to be fully alert. MAKE SURE YOU:  Understand these instructions.   Will watch your condition.   Will get help right away if you are not doing well or get worse.  Document Released: 07/18/2004 Document Revised: 02/20/2011 Document Reviewed: 10/18/2009 [facility] Patient Information 2012 [place], [place].Epilepsy A seizure (convulsion) is a sudden change in brain function that causes a change in behavior, muscle activity, or ability to remain awake and alert. If a person has recurring seizures, this is called epilepsy. CAUSES  Epilepsy is a disorder with many possible causes. Anything that disturbs the normal pattern of brain cell activity can lead to seizures. Seizure can be caused from illness to brain damage to abnormal brain development. Epilepsy may develop because of:  An abnormality in brain wiring.   An imbalance of nerve signaling chemicals (neurotransmitters).   Some combination of these factors.  Scientists are learning an increasing amount about genetic causes of seizures. SYMPTOMS  The symptoms of a seizure can vary greatly from one person to another. These may include:  An aura, or warning that tells a person they are about to have a  seizure.   Abnormal sensations, such as abnormal smell or seeing flashing lights.   Sudden, general body stiffness.   Rhythmic jerking of the face, arm, or leg - on one or both sides.   Sudden change in consciousness.   The person may appear to be awake but not responding.   They may appear to be asleep but cannot be awakened.   Grimacing, chewing, lip smacking, or drooling.   Often there is a period of sleepiness after a seizure.  DIAGNOSIS  The description you give to your caregiver about what you experienced will help them understand your problems. Equally important is the description by any witnesses to your seizure. A physical exam, including a detailed neurological exam, is necessary. An EEG (electroencephalogram) is a painless test of your brain waves. In this test a diagram is created of your brain waves. These diagrams can be interpreted by a specialist. Pictures of your brain are usually taken with:  An MRI.   A CT scan.  Lab tests may be done to look for:  Signs of infection.   Abnormal blood chemistry.  PREVENTION  There is no way to prevent the development of epilepsy. If you have seizures that are typically triggered by an event (such as flashing lights), try to avoid the trigger. This can help you avoid a seizure.  PROGNOSIS  Most people with epilepsy lead outwardly normal lives. While epilepsy cannot currently be cured, for some people it does eventually go away. Most seizures do not  cause brain damage. It is not uncommon for people with epilepsy, especially children, to develop behavioral and emotional problems. These problems are sometimes the consequence of medicine for seizures or social stress. For some people with epilepsy, the risk of seizures restricts their independence and recreational activities. For example, some states refuse drivers licenses to people with epilepsy. Most women with epilepsy can become pregnant. They should discuss their epilepsy and the  medicine they are taking with their caregivers. Women with epilepsy have a 90 percent or better chance of having a normal, healthy baby. RISKS AND COMPLICATIONS  People with epilepsy are at increased risk of falls, accidents, and injuries. People with epilepsy are at special risk for two life-threatening conditions. These are status epilepticus and sudden unexplained death (extremely rare). Status epilepticus is a long lasting, continuous seizure that is a medical emergency. TREATMENT  Once epilepsy is diagnosed, it is important to begin treatment as soon as possible. For about 80 percent of those diagnosed with epilepsy, seizures can be controlled with modern medicines and surgical techniques. Some antiepileptic drugs can interfere with the effectiveness of oral contraceptives. In 1997, the FDA approved a pacemaker for the brain the (vagus nerve stimulator). This stimulator can be used for people with seizures that are not well-controlled by medicine. Studies have shown that in some cases, children may experience fewer seizures if they maintain a strict diet. The strict diet is called the ketogenic diet. This diet is rich in fats and low in carbohydrates. HOME CARE INSTRUCTIONS   Your caregiver will make recommendations about driving and safety in normal activities. Follow these carefully.   Take any medicine prescribed exactly as directed.   Do any blood tests requested to monitor the levels of your medicine.   The people you live and work with should know that you are prone to seizures. They should receive instructions on how to help you. In general, a witness to a seizure should:   Cushion your head and body.   Turn you on your side.   Avoid unnecessarily restraining you.   Not place anything inside your mouth.   Call for local emergency medical help if there is any question about what has occurred.   Keep a seizure diary. Record what you recall about any seizure, especially any possible  trigger.   If your caregiver has given you a follow-up appointment, it is very important to keep that appointment. Not keeping the appointment could result in permanent injury and disability. If there is any problem keeping the appointment, you must call back to this facility for assistance.  SEEK MEDICAL CARE IF:   You develop signs of infection or other illness. This might increase the risk of a seizure.   You seem to be having more frequent seizures.   Your seizure pattern is changing.  SEEK IMMEDIATE MEDICAL CARE IF:   A seizure does not stop after a few moments.   A seizure causes any difficulty in breathing.   A seizure results in a very severe headache.   A seizure leaves you with the inability to speak or use a part of your body.  MAKE SURE YOU:   Understand these instructions.   Will watch your condition.   Will get help right away if you are not doing well or get worse.  Document Released: 06/10/2005 Document Revised: 02/20/2011 Document Reviewed: 01/15/2008 [facility] Patient Information 2012 [place], [place].

## 2011-06-05 NOTE — Plan of Care (Signed)
Problem: Phase I Progression Outcomes Goal: Voiding-avoid urinary catheter unless indicated Outcome: Completed/Met Date Met:  06/05/11 Pt voiding in urinal

## 2011-06-05 NOTE — Procedures (Signed)
EEG ID:  [id number].  HISTORY:  This is a 70 year old man with 2 new grand mal seizures.  MEDICATIONS:  Dilantin.  CONDITION OF RECORDING:  This 16-lead EEG was recorded with the patient in awake and drowsy states.  Background rhythms:  Background patterns in wakefulness were well organized with a well-sustained posterior dominant rhythm of 9 Hz, symmetrical and reactive to eye opening and closing. Drowsiness was associated with mild attenuation of voltage and slowing of frequencies. Abnormal potentials: no epileptiform activity was noted. Intermittent right temporal delta slowing was noted.  ACTIVATION PROCEDURES:  Hyperventilation was not performed.  Photic Stimulation: did not activate tracing.  EKG:  Single-channel EKG monitoring was unremarkable.  IMPRESSION:  This was an abnormal awake and drowsy EEG due to presence of intermittent right temporal delta slowing.  The finding may be suggestive of an underlying structural or functional abnormality of any etiology in that head region.  Clinical correlation was suggested.          ______________________________ Carmell Austria, MD    [id number] D:  06/04/2011 17:20:28  T:  06/05/2011 00:04:38  Job #:  [id number]

## 2011-06-07 NOTE — ED Provider Notes (Signed)
Medical screening examination/treatment/procedure(s) were performed by non-physician practitioner and as supervising physician I was immediately available for consultation/collaboration.  Jasmine Awe, MD 06/07/11 [phone]

## 2011-07-03 DIAGNOSIS — R7401 Elevation of levels of liver transaminase levels: Secondary | ICD-10-CM | POA: Diagnosis not present

## 2011-07-03 DIAGNOSIS — K7689 Other specified diseases of liver: Secondary | ICD-10-CM | POA: Diagnosis not present

## 2011-07-03 DIAGNOSIS — R748 Abnormal levels of other serum enzymes: Secondary | ICD-10-CM | POA: Diagnosis not present

## 2011-07-31 DIAGNOSIS — G40309 Generalized idiopathic epilepsy and epileptic syndromes, not intractable, without status epilepticus: Secondary | ICD-10-CM | POA: Diagnosis not present

## 2011-07-31 DIAGNOSIS — G4730 Sleep apnea, unspecified: Secondary | ICD-10-CM | POA: Diagnosis not present

## 2011-08-05 DIAGNOSIS — Z23 Encounter for immunization: Secondary | ICD-10-CM | POA: Diagnosis not present

## 2011-08-20 DIAGNOSIS — D509 Iron deficiency anemia, unspecified: Secondary | ICD-10-CM | POA: Diagnosis not present

## 2011-08-20 DIAGNOSIS — K5730 Diverticulosis of large intestine without perforation or abscess without bleeding: Secondary | ICD-10-CM | POA: Diagnosis not present

## 2011-08-27 DIAGNOSIS — H4010X0 Unspecified open-angle glaucoma, stage unspecified: Secondary | ICD-10-CM | POA: Diagnosis not present

## 2011-08-27 DIAGNOSIS — H1130 Conjunctival hemorrhage, unspecified eye: Secondary | ICD-10-CM | POA: Diagnosis not present

## 2011-09-11 DIAGNOSIS — E559 Vitamin D deficiency, unspecified: Secondary | ICD-10-CM | POA: Diagnosis not present

## 2011-09-11 DIAGNOSIS — E291 Testicular hypofunction: Secondary | ICD-10-CM | POA: Diagnosis not present

## 2011-09-11 DIAGNOSIS — N400 Enlarged prostate without lower urinary tract symptoms: Secondary | ICD-10-CM | POA: Diagnosis not present

## 2011-09-11 DIAGNOSIS — D509 Iron deficiency anemia, unspecified: Secondary | ICD-10-CM | POA: Diagnosis not present

## 2011-09-11 DIAGNOSIS — R350 Frequency of micturition: Secondary | ICD-10-CM | POA: Diagnosis not present

## 2011-09-11 DIAGNOSIS — R569 Unspecified convulsions: Secondary | ICD-10-CM | POA: Diagnosis not present

## 2011-09-11 DIAGNOSIS — I10 Essential (primary) hypertension: Secondary | ICD-10-CM | POA: Diagnosis not present

## 2011-09-11 DIAGNOSIS — IMO0001 Reserved for inherently not codable concepts without codable children: Secondary | ICD-10-CM | POA: Diagnosis not present

## 2011-09-17 DIAGNOSIS — D485 Neoplasm of uncertain behavior of skin: Secondary | ICD-10-CM | POA: Diagnosis not present

## 2011-10-02 DIAGNOSIS — D485 Neoplasm of uncertain behavior of skin: Secondary | ICD-10-CM | POA: Diagnosis not present

## 2011-10-03 ENCOUNTER — Encounter (HOSPITAL_BASED_OUTPATIENT_CLINIC_OR_DEPARTMENT_OTHER): Payer: Self-pay | Admitting: *Deleted

## 2011-10-03 NOTE — Progress Notes (Signed)
Pt works part [employer] copy- [employer]-,but has sleep apnea-will bring cpap-knows to wear it post op Needs i stat  Had a seizure 12/12-all test were done-not had another-can drive

## 2011-10-07 ENCOUNTER — Encounter (HOSPITAL_BASED_OUTPATIENT_CLINIC_OR_DEPARTMENT_OTHER): Payer: Self-pay | Admitting: Anesthesiology

## 2011-10-07 ENCOUNTER — Encounter (HOSPITAL_BASED_OUTPATIENT_CLINIC_OR_DEPARTMENT_OTHER)
Admission: RE | Disposition: A | Discharge status: Home / Self Care | Payer: Self-pay | Source: Ambulatory Visit | Attending: Specialist

## 2011-10-07 ENCOUNTER — Ambulatory Visit (HOSPITAL_BASED_OUTPATIENT_CLINIC_OR_DEPARTMENT_OTHER): Payer: Medicare Other | Admitting: Anesthesiology

## 2011-10-07 ENCOUNTER — Encounter (HOSPITAL_BASED_OUTPATIENT_CLINIC_OR_DEPARTMENT_OTHER): Payer: Self-pay | Admitting: *Deleted

## 2011-10-07 ENCOUNTER — Ambulatory Visit (HOSPITAL_BASED_OUTPATIENT_CLINIC_OR_DEPARTMENT_OTHER)
Admission: RE | Admit: 2011-10-07 | Discharge: 2011-10-07 | Disposition: A | Discharge status: Home / Self Care | Payer: Medicare Other | Source: Ambulatory Visit | Attending: Specialist | Admitting: Specialist

## 2011-10-07 DIAGNOSIS — D485 Neoplasm of uncertain behavior of skin: Secondary | ICD-10-CM | POA: Diagnosis not present

## 2011-10-07 DIAGNOSIS — E119 Type 2 diabetes mellitus without complications: Secondary | ICD-10-CM | POA: Insufficient documentation

## 2011-10-07 DIAGNOSIS — R229 Localized swelling, mass and lump, unspecified: Secondary | ICD-10-CM | POA: Diagnosis not present

## 2011-10-07 DIAGNOSIS — M799 Soft tissue disorder, unspecified: Secondary | ICD-10-CM | POA: Diagnosis not present

## 2011-10-07 DIAGNOSIS — D1739 Benign lipomatous neoplasm of skin and subcutaneous tissue of other sites: Secondary | ICD-10-CM | POA: Diagnosis not present

## 2011-10-07 DIAGNOSIS — I10 Essential (primary) hypertension: Secondary | ICD-10-CM | POA: Insufficient documentation

## 2011-10-07 HISTORY — PX: LIPOSUCTION: SHX10

## 2011-10-07 HISTORY — PX: MASS EXCISION: SHX2000

## 2011-10-07 LAB — POCT I-STAT, CHEM 8
BUN: 7 mg/dL (ref 6–23)
Calcium, Ion: 1.09 mmol/L — ABNORMAL LOW (ref 1.12–1.32)
Chloride: 99 mEq/L (ref 96–112)
Sodium: 138 mEq/L (ref 135–145)

## 2011-10-07 SURGERY — EXCISION MASS
Anesthesia: General | Site: Back | Laterality: Left | Wound class: Clean

## 2011-10-07 MED ORDER — METOCLOPRAMIDE HCL 5 MG/ML IJ SOLN
10.0000 mg | Freq: Once | INTRAMUSCULAR | Status: DC | PRN
Start: 2011-10-07 — End: 2011-10-07

## 2011-10-07 MED ORDER — MIDAZOLAM HCL 5 MG/5ML IJ SOLN
INTRAMUSCULAR | Status: DC | PRN
  Administered 2011-10-07: 1 mg via INTRAVENOUS

## 2011-10-07 MED ORDER — ACETAMINOPHEN-CODEINE #3 300-30 MG PO TABS
1.0000 | ORAL_TABLET | Freq: Once | ORAL | Status: AC | PRN
Start: 2011-10-07 — End: 2011-10-07
  Administered 2011-10-07: 1 via ORAL

## 2011-10-07 MED ORDER — SUCCINYLCHOLINE CHLORIDE 20 MG/ML IJ SOLN
INTRAMUSCULAR | Status: DC | PRN
  Administered 2011-10-07: 100 mg via INTRAVENOUS

## 2011-10-07 MED ORDER — METOCLOPRAMIDE HCL 5 MG/ML IJ SOLN
INTRAMUSCULAR | Status: DC | PRN
  Administered 2011-10-07: 10 mg via INTRAVENOUS

## 2011-10-07 MED ORDER — LACTATED RINGERS IV SOLN
INTRAVENOUS | Status: DC
  Administered 2011-10-07: 07:00:00 via INTRAVENOUS

## 2011-10-07 MED ORDER — LIDOCAINE HCL (CARDIAC) 20 MG/ML IV SOLN
INTRAVENOUS | Status: DC | PRN
  Administered 2011-10-07: 60 mg via INTRAVENOUS

## 2011-10-07 MED ORDER — HYDROMORPHONE HCL PF 1 MG/ML IJ SOLN
0.2500 mg | INTRAMUSCULAR | Status: DC | PRN
  Administered 2011-10-07: 0.5 mg via INTRAVENOUS

## 2011-10-07 MED ORDER — PROPOFOL 10 MG/ML IV EMUL
INTRAVENOUS | Status: DC | PRN
  Administered 2011-10-07: 200 mg via INTRAVENOUS

## 2011-10-07 MED ORDER — DEXAMETHASONE SODIUM PHOSPHATE 4 MG/ML IJ SOLN
INTRAMUSCULAR | Status: DC | PRN
  Administered 2011-10-07: 4 mg via INTRAVENOUS

## 2011-10-07 MED ORDER — ONDANSETRON HCL 4 MG/2ML IJ SOLN
INTRAMUSCULAR | Status: DC | PRN
  Administered 2011-10-07: 4 mg via INTRAVENOUS

## 2011-10-07 MED ORDER — SODIUM CHLORIDE 0.9 % IV SOLN
INTRAVENOUS | Status: DC | PRN
  Administered 2011-10-07: 150 mL via INTRAMUSCULAR

## 2011-10-07 MED ORDER — LIDOCAINE-EPINEPHRINE 0.5-1:200000 % IJ SOLN
INTRAMUSCULAR | Status: DC | PRN
  Administered 2011-10-07: 50 mL

## 2011-10-07 MED ORDER — ACETAMINOPHEN 10 MG/ML IV SOLN
1000.0000 mg | Freq: Once | INTRAVENOUS | Status: AC
  Administered 2011-10-07: 1000 mg via INTRAVENOUS

## 2011-10-07 MED ORDER — CEFAZOLIN SODIUM 1-5 GM-% IV SOLN
1.0000 g | INTRAVENOUS | Status: AC
  Administered 2011-10-07: 1 g via INTRAVENOUS

## 2011-10-07 MED ORDER — FENTANYL CITRATE 0.05 MG/ML IJ SOLN
INTRAMUSCULAR | Status: DC | PRN
  Administered 2011-10-07: 50 ug via INTRAVENOUS

## 2011-10-07 SURGICAL SUPPLY — 78 items
BAG DECANTER FOR FLEXI CONT (MISCELLANEOUS) IMPLANT
BANDAGE ELASTIC 4 VELCRO ST LF (GAUZE/BANDAGES/DRESSINGS) IMPLANT
BANDAGE GAUZE ELAST BULKY 4 IN (GAUZE/BANDAGES/DRESSINGS) IMPLANT
BENZOIN TINCTURE PRP APPL 2/3 (GAUZE/BANDAGES/DRESSINGS) IMPLANT
BLADE HEX COATED 2.75 (ELECTRODE) IMPLANT
BLADE KNIFE PERSONA 10 (BLADE) IMPLANT
BLADE KNIFE PERSONA 15 (BLADE) ×2 IMPLANT
BNDG COHESIVE 4X5 TAN STRL (GAUZE/BANDAGES/DRESSINGS) IMPLANT
CANISTER LINER 1300 C W/ELBOW (MISCELLANEOUS) ×2 IMPLANT
CANISTER SUCTION 1200CC (MISCELLANEOUS) IMPLANT
CLEANER CAUTERY TIP 5X5 PAD (MISCELLANEOUS) ×1 IMPLANT
CLOTH BEACON ORANGE TIMEOUT ST (SAFETY) ×2 IMPLANT
COTTONBALL LRG STERILE PKG (GAUZE/BANDAGES/DRESSINGS) IMPLANT
COVER MAYO STAND STRL (DRAPES) ×2 IMPLANT
COVER TABLE BACK 60X90 (DRAPES) ×2 IMPLANT
DECANTER SPIKE VIAL GLASS SM (MISCELLANEOUS) IMPLANT
DRAPE PED LAPAROTOMY (DRAPES) ×2 IMPLANT
DRAPE U-SHAPE 76X120 STRL (DRAPES) IMPLANT
DRSG PAD ABDOMINAL 8X10 ST (GAUZE/BANDAGES/DRESSINGS) ×2 IMPLANT
ELECT NEEDLE TIP 2.8 STRL (NEEDLE) IMPLANT
ELECT REM PT RETURN 9FT ADLT (ELECTROSURGICAL) ×2
ELECTRODE REM PT RTRN 9FT ADLT (ELECTROSURGICAL) ×1 IMPLANT
FILTER LIPOSUCTION (MISCELLANEOUS) ×2 IMPLANT
GAUZE SPONGE 4X4 12PLY STRL LF (GAUZE/BANDAGES/DRESSINGS) ×2 IMPLANT
GAUZE SPONGE 4X4 16PLY XRAY LF (GAUZE/BANDAGES/DRESSINGS) IMPLANT
GAUZE XEROFORM 1X8 LF (GAUZE/BANDAGES/DRESSINGS) IMPLANT
GAUZE XEROFORM 5X9 LF (GAUZE/BANDAGES/DRESSINGS) IMPLANT
GLOVE BIO SURGEON STRL SZ 6.5 (GLOVE) ×2 IMPLANT
GLOVE BIOGEL M STRL SZ7.5 (GLOVE) ×2 IMPLANT
GLOVE ECLIPSE 7.0 STRL STRAW (GLOVE) ×2 IMPLANT
GLOVE INDICATOR 8.0 STRL GRN (GLOVE) ×2 IMPLANT
GOWN PREVENTION PLUS XLARGE (GOWN DISPOSABLE) IMPLANT
GOWN PREVENTION PLUS XXLARGE (GOWN DISPOSABLE) ×2 IMPLANT
IV LACTATED RINGERS 1000ML (IV SOLUTION) IMPLANT
IV NS 500ML (IV SOLUTION)
IV NS 500ML BAXH (IV SOLUTION) IMPLANT
NDL SAFETY ECLIPSE 18X1.5 (NEEDLE) IMPLANT
NEEDLE FILTER BLUNT 18X 1/2SAF (NEEDLE)
NEEDLE FILTER BLUNT 18X1 1/2 (NEEDLE) IMPLANT
NEEDLE HYPO 18GX1.5 SHARP (NEEDLE)
NEEDLE HYPO 25X1 1.5 SAFETY (NEEDLE) ×2 IMPLANT
NEEDLE SPNL 18GX3.5 QUINCKE PK (NEEDLE) ×2 IMPLANT
PACK BASIN DAY SURGERY FS (CUSTOM PROCEDURE TRAY) ×2 IMPLANT
PAD CLEANER CAUTERY TIP 5X5 (MISCELLANEOUS) ×1
PENCIL BUTTON HOLSTER BLD 10FT (ELECTRODE) IMPLANT
SHEET MEDIUM DRAPE 40X70 STRL (DRAPES) IMPLANT
SHEETING SILICONE GEL EPI DERM (MISCELLANEOUS) IMPLANT
SPONGE GAUZE 4X4 12PLY (GAUZE/BANDAGES/DRESSINGS) IMPLANT
SPONGE LAP 18X18 X RAY DECT (DISPOSABLE) ×2 IMPLANT
STAPLER VISISTAT 35W (STAPLE) ×2 IMPLANT
STOCKINETTE 4X48 STRL (DRAPES) IMPLANT
STOCKINETTE IMPERVIOUS LG (DRAPES) IMPLANT
STRIP CLOSURE SKIN 1/2X4 (GAUZE/BANDAGES/DRESSINGS) IMPLANT
STRIP CLOSURE SKIN 1/8X3 (GAUZE/BANDAGES/DRESSINGS) IMPLANT
STRIP SUTURE WOUND CLOSURE 1/2 (SUTURE) IMPLANT
SUCTION FRAZIER TIP 10 FR DISP (SUCTIONS) IMPLANT
SUT ETHILON 3 0 PS 1 (SUTURE) IMPLANT
SUT MNCRL AB 3-0 PS2 18 (SUTURE) IMPLANT
SUT MON AB 2-0 CT1 36 (SUTURE) IMPLANT
SUT MON AB 5-0 PS2 18 (SUTURE) IMPLANT
SUT PROLENE 4 0 P 3 18 (SUTURE) IMPLANT
SUT PROLENE 4 0 PS 2 18 (SUTURE) ×2 IMPLANT
SUT SILK 3 0 PS 1 (SUTURE) IMPLANT
SUT STRIPS 1/4 X 4 INCH (SUTURE) IMPLANT
SUT VIC AB 0 CT1 27 (SUTURE)
SUT VIC AB 0 CT1 27XBRD ANBCTR (SUTURE) IMPLANT
SUT VIC AB 3-0 FS2 27 (SUTURE) IMPLANT
SUT VIC AB 5-0 PS2 18 (SUTURE) IMPLANT
SYR 20CC LL (SYRINGE) IMPLANT
SYR 50ML LL SCALE MARK (SYRINGE) ×4 IMPLANT
SYR CONTROL 10ML LL (SYRINGE) ×2 IMPLANT
TAPE HYPAFIX 6X30 (GAUZE/BANDAGES/DRESSINGS) IMPLANT
TOWEL OR 17X24 6PK STRL BLUE (TOWEL DISPOSABLE) ×4 IMPLANT
TRAY DSU PREP LF (CUSTOM PROCEDURE TRAY) ×2 IMPLANT
TUBE CONNECTING 20X1/4 (TUBING) IMPLANT
TUBING SET GRADUATE ASPIR 12FT (MISCELLANEOUS) ×2 IMPLANT
UNDERPAD 30X30 INCONTINENT (UNDERPADS AND DIAPERS) ×2 IMPLANT
WATER STERILE IRR 1000ML POUR (IV SOLUTION) ×2 IMPLANT

## 2011-10-07 NOTE — H&P (Signed)
Joseph Copeland is an 71 y.o. male.   Chief Complaint: Enlarging mass left back HPI: mass has increased in size with pain and discomfort. The increased growth has caused decreasd range of motion of the left upper extremity  Past Medical History  Diagnosis Date  . Hypertension   . Arthritis   . Seizures     first12/12-none since  . Diabetes mellitus     type 2-no meds    Past Surgical History  Procedure Date  . Hemrhoidectomy   . Colonoscopy     History reviewed. No pertinent family history. Social History:  reports that he quit smoking about 45 years ago. He has never used smokeless tobacco. He reports that he drinks about 12.6 ounces of alcohol per week. He reports that he does not use illicit drugs.  Allergies: No Known Allergies  Medications Prior to Admission  Medication Dose Route Frequency Provider Last Rate Last Dose  . acetaminophen (OFIRMEV) IV 1,000 mg  1,000 mg Intravenous Once Hart Robinsons, MD   1,000 mg at 10/07/11 0656  . lactated ringers infusion   Intravenous Continuous Hart Robinsons, MD 20 mL/hr at 10/07/11 [phone]     Medications Prior to Admission  Medication Sig Dispense Refill  . CALCIUM CARBONATE PO Take 1 tablet by mouth daily.        . Cholecalciferol (VITAMIN D PO) Take 1 tablet by mouth daily.        . fish oil-omega-3 fatty acids 1000 MG capsule Take 2 g by mouth daily.        . metoprolol tartrate (LOPRESSOR) 12.5 mg TABS Take 1 tablet (25 mg total) by mouth 2 (two) times daily.  60 tablet  5  . Multiple Vitamins-Minerals (MULTIVITAMINS THER. W/MINERALS) TABS Take 1 tablet by mouth daily.        . phenytoin (DILANTIN) 50 MG tablet Chew 2 tablets (100 mg total) by mouth at bedtime.  90 tablet  5  . Cyanocobalamin (VITAMIN B 12 PO) Take 1 tablet by mouth daily.          Results for orders placed during the hospital encounter of 10/07/11 (from the past 48 hour(s))  POCT I-STAT, CHEM 8     Status: Abnormal   Collection Time   10/07/11  7:01 AM      Component Value Range Comment   Sodium 138  135 - 145 (mEq/L)    Potassium 4.2  3.5 - 5.1 (mEq/L)    Chloride 99  96 - 112 (mEq/L)    BUN 7  6 - 23 (mg/dL)    Creatinine, Ser 9.60  0.50 - 1.35 (mg/dL)    Glucose, Bld 454 (*) 70 - 99 (mg/dL)    Calcium, Ion 0.98 (*) 1.12 - 1.32 (mmol/L)    TCO2 28  0 - 100 (mmol/L)    Hemoglobin 15.0  13.0 - 17.0 (g/dL)    HCT 11.9  14.7 - 82.9 (%)    No results found.  Review of Systems  Constitutional: Negative.   HENT: Negative.   Eyes: Negative.   Respiratory: Negative.   Cardiovascular: Negative.   Gastrointestinal: Negative.   Genitourinary: Positive for frequency.  Musculoskeletal: Negative.   Skin: Negative.   Neurological: Positive for seizures.  Endo/Heme/Allergies: Negative.   Psychiatric/Behavioral: Negative.     Blood pressure 131/85, pulse 71, temperature 98.4 F (36.9 C), temperature source Oral, resp. rate 18, height 5\' 2"  (1.575 m), weight 61.236 kg (135 lb), SpO2 96.00%. Physical Exam   Assessment/Plan R/o  large lipoma,r/o liposarcoma or other masses of unknown cellular morphology  planis excision  Hosie Sharman L 10/07/2011, 7:48 AM

## 2011-10-07 NOTE — Op Note (Signed)
NAMEADALBERT, ALBERTO               ACCOUNT NO.:  [id number]  MEDICAL RECORD NO.:  [id number]  LOCATION:  3006                         FACILITY:  [facility]  PHYSICIAN:  Earvin Hansen L. Harald Quevedo, M.D.DATE OF BIRTH:  1940/10/31  DATE OF PROCEDURE:  10/07/2011 DATE OF DISCHARGE:                              OPERATIVE REPORT   This is a 71 year old gentleman who has a large mass involving his left back axillary regions, increased pain, discomfort and decreased range of motion of the left upper extremity.  PROCEDURES DONE:  Excision of mass subfascially, liposuction assistance.  ANESTHESIA:  General.  The patient underwent general anesthesia and intubated orally.  He was then placed in a prone position with protection of his hand.  Prep was done to the left back, right back areas, and arm using Hibiclens soap and solution walled off with sterile towels and drapes so as to make a sterile field.  Local anesthesia 0.25% Xylocaine with epinephrine 1:400,000 concentration injected locally, total 150 mL.  This was allowed to sit up, then lie, and then an incision was made over the mass, carried down to the skin and subcutaneous tissue using sharp and blunt dissection, and was able to remove muscle and mass but some was subfascial.  I made an incision there, liposuction fashion underneath that area removing copious amounts of fatty material consistent with lipoma, rule out liposarcoma.  After symmetrically removing the mass circumferentially, the incision was then closed loosely with a 4-0 Prolene suture.  Wounds were cleansed.  Sterile dressing was applied. Pressure dressing with 4x4s, ABDs, Hypafix tape.  He withstood the procedures very well, was taken to recovery room in excellent condition.  ESTIMATED BLOOD LOSS:  Less than 50 mL.  COMPLICATIONS:  None.     Yaakov Guthrie. Shon Hough, M.D.     Cathie Hoops  D:  10/07/2011  T:  10/07/2011  Job:  [id number]

## 2011-10-07 NOTE — Transfer of Care (Signed)
Immediate Anesthesia Transfer of Care Note  Patient: Joseph Copeland  Procedure(s) Performed: Procedure(s) (LRB): EXCISION MASS (Left) LIPOSUCTION (Left)  Patient Location: PACU  Anesthesia Type: General  Level of Consciousness: awake, alert  and oriented  Airway & Oxygen Therapy: Patient Spontanous Breathing and Patient connected to face mask oxygen  Post-op Assessment: Report given to PACU RN and Post -op Vital signs reviewed and stable  Post vital signs: Reviewed and stable  Complications: No apparent anesthesia complications

## 2011-10-07 NOTE — Anesthesia Procedure Notes (Signed)
Procedure Name: Intubation Performed by: Cassiel Fernandez W Pre-anesthesia Checklist: Patient identified, Timeout performed, Emergency Drugs available, Suction available and Patient being monitored Patient Re-evaluated:Patient Re-evaluated prior to inductionOxygen Delivery Method: Circle system utilized Preoxygenation: Pre-oxygenation with 100% oxygen Intubation Type: IV induction Ventilation: Mask ventilation without difficulty Laryngoscope Size: Miller and 2 Grade View: Grade II Tube type: Oral Tube size: 7.0 mm Number of attempts: 1 Airway Equipment and Method: Stylet Placement Confirmation: ETT inserted through vocal cords under direct vision,  breath sounds checked- equal and bilateral and positive ETCO2 Secured at: 22 cm Tube secured with: Tape Dental Injury: Teeth and Oropharynx as per pre-operative assessment      

## 2011-10-07 NOTE — Anesthesia Preprocedure Evaluation (Signed)
Anesthesia Evaluation  Patient identified by MRN, date of birth, ID band Patient awake    Reviewed: Allergy & Precautions, H&P , NPO status , Patient's Chart, lab work & pertinent test results, reviewed documented beta blocker date and time   Airway Mallampati: II TM Distance: >3 FB Neck ROM: full    Dental   Pulmonary neg pulmonary ROS,          Cardiovascular hypertension, On Medications and On Home Beta Blockers     Neuro/Psych Seizures -, Well Controlled,  negative psych ROS   GI/Hepatic negative GI ROS, Neg liver ROS,   Endo/Other  Diabetes mellitus-  Renal/GU negative Renal ROS  negative genitourinary   Musculoskeletal   Abdominal   Peds  Hematology negative hematology ROS (+)   Anesthesia Other Findings See surgeon's H&P   Reproductive/Obstetrics negative OB ROS                           Anesthesia Physical Anesthesia Plan  ASA: III  Anesthesia Plan: General   Post-op Pain Management:    Induction: Intravenous  Airway Management Planned: Oral ETT  Additional Equipment:   Intra-op Plan:   Post-operative Plan: Extubation in OR  Informed Consent: I have reviewed the patients History and Physical, chart, labs and discussed the procedure including the risks, benefits and alternatives for the proposed anesthesia with the patient or authorized representative who has indicated his/her understanding and acceptance.     Plan Discussed with: CRNA and Surgeon  Anesthesia Plan Comments:         Anesthesia Quick Evaluation

## 2011-10-07 NOTE — Discharge Instructions (Signed)
Keep pressure off left arm    Ice packs x 24 hrs  No heavy use of arm x 48 hrs  Call me if there are any problems [phone] (include date of return to work if known) As tolerated: NO showers NO driving No heavy activities  Diet:regular No restrictions:  Wound Care: Keep dressing clean & dry  Don not change dressings For Abdominoplasties wear abdominal binder Special Instructions: Do not raise arms up Continue to empty, recharge, & record drainage from J-P drains &/or Hemovacs 2-3 times a day, as needed. Call Doctor if any unusual problems occur such as pain, excessive Bleeding, unrelieved Nausea/vomiting, Fever &/or chills When lying down, keep head elevated on 2-3 pillows or back-rest For Addominoplasties the Jack-knife position Follow-up appointment: Please call the office.  The patient received discharge instruction from:___________________________________________      Patient signature ________________________________________ / Date___________    Signature of individual providing instructions/ Date________________            Post Anesthesia Home Care Instructions  Activity: Get plenty of rest for the remainder of the day. A responsible adult should stay with you for 24 hours following the procedure.  For the next 24 hours, DO NOT: -Drive a car -Advertising copywriter -Drink alcoholic beverages -Take any medication unless instructed by your physician -Make any legal decisions or sign important papers.  Meals: Start with liquid foods such as gelatin or soup. Progress to regular foods as tolerated. Avoid greasy, spicy, heavy foods. If nausea and/or vomiting occur, drink only clear liquids until the nausea and/or vomiting subsides. Call your physician if vomiting continues.  Special Instructions/Symptoms: Your throat may feel dry or sore from the anesthesia or the breathing tube placed in your throat during surgery. If this causes discomfort, gargle with warm  salt water. The discomfort should disappear within 24 hours.

## 2011-10-07 NOTE — Brief Op Note (Signed)
10/07/2011  8:49 AM  PATIENT:  Joseph Copeland  71 y.o. male  PRE-OPERATIVE DIAGNOSIS:  large mass on back  POST-OPERATIVE DIAGNOSIS:  large mass on back  PROCEDURE:  Procedure(s) (LRB): EXCISION MASS (Left) LIPOSUCTION (Left)  SURGEON:  Surgeon(s) and Role:    * Louisa Second, MD - Primary  PHYSICIAN ASSISTANT: Jacqualine Code RNFA  ASSISTANTS: none   ANESTHESIA:   general  EBL:  Total I/O In: 1100 [I.V.:1100] Out: -   BLOOD ADMINISTERED:none  DRAINS: none   LOCAL MEDICATIONS USED:  LIDOCAINE   SPECIMEN:  Excision  DISPOSITION OF SPECIMEN:  PATHOLOGY  COUNTS:  YES  TOURNIQUET:    DICTATION: .Other Dictation: Dictation Number [id number]  PLAN OF CARE: Discharge to home after PACU  PATIENT DISPOSITION:  PACU - hemodynamically stable.   Delay start of Pharmacological VTE agent (>24hrs) due to surgical blood loss or risk of bleeding: not applicable

## 2011-10-07 NOTE — Anesthesia Postprocedure Evaluation (Signed)
Anesthesia Post Note  Patient: Joseph Copeland  Procedure(s) Performed: Procedure(s) (LRB): EXCISION MASS (Left) LIPOSUCTION (Left)  Anesthesia type: General  Patient location: PACU  Post pain: Pain level controlled  Post assessment: Patient's Cardiovascular Status Stable  Last Vitals:  Filed Vitals:   10/07/11 0951  BP: 124/76  Pulse: 68  Temp: 36.5 C  Resp: 16    Post vital signs: Reviewed and stable  Level of consciousness: alert  Complications: No apparent anesthesia complications

## 2011-10-08 ENCOUNTER — Encounter (HOSPITAL_BASED_OUTPATIENT_CLINIC_OR_DEPARTMENT_OTHER): Payer: Self-pay | Admitting: Specialist

## 2011-10-11 ENCOUNTER — Encounter (HOSPITAL_BASED_OUTPATIENT_CLINIC_OR_DEPARTMENT_OTHER): Payer: Self-pay

## 2011-10-11 DIAGNOSIS — G40309 Generalized idiopathic epilepsy and epileptic syndromes, not intractable, without status epilepticus: Secondary | ICD-10-CM | POA: Diagnosis not present

## 2011-10-15 DIAGNOSIS — H4011X0 Primary open-angle glaucoma, stage unspecified: Secondary | ICD-10-CM | POA: Diagnosis not present

## 2011-10-21 DIAGNOSIS — N401 Enlarged prostate with lower urinary tract symptoms: Secondary | ICD-10-CM | POA: Diagnosis not present

## 2011-11-19 DIAGNOSIS — L570 Actinic keratosis: Secondary | ICD-10-CM | POA: Diagnosis not present

## 2011-11-21 DIAGNOSIS — N401 Enlarged prostate with lower urinary tract symptoms: Secondary | ICD-10-CM | POA: Diagnosis not present

## 2011-11-21 DIAGNOSIS — R350 Frequency of micturition: Secondary | ICD-10-CM | POA: Diagnosis not present

## 2011-11-21 DIAGNOSIS — R3915 Urgency of urination: Secondary | ICD-10-CM | POA: Diagnosis not present

## 2011-12-05 DIAGNOSIS — M545 Low back pain, unspecified: Secondary | ICD-10-CM | POA: Diagnosis not present

## 2011-12-09 ENCOUNTER — Other Ambulatory Visit: Payer: Self-pay | Admitting: Neurological Surgery

## 2011-12-09 DIAGNOSIS — M545 Low back pain, unspecified: Secondary | ICD-10-CM

## 2011-12-12 ENCOUNTER — Ambulatory Visit
Admission: RE | Admit: 2011-12-12 | Discharge: 2011-12-12 | Disposition: A | Discharge status: Home / Self Care | Payer: Medicare Other | Source: Ambulatory Visit | Attending: Neurological Surgery | Admitting: Neurological Surgery

## 2011-12-12 DIAGNOSIS — M545 Low back pain, unspecified: Secondary | ICD-10-CM

## 2011-12-12 DIAGNOSIS — M5137 Other intervertebral disc degeneration, lumbosacral region: Secondary | ICD-10-CM | POA: Diagnosis not present

## 2011-12-12 DIAGNOSIS — M5126 Other intervertebral disc displacement, lumbar region: Secondary | ICD-10-CM | POA: Diagnosis not present

## 2011-12-12 DIAGNOSIS — M47817 Spondylosis without myelopathy or radiculopathy, lumbosacral region: Secondary | ICD-10-CM | POA: Diagnosis not present

## 2011-12-16 DIAGNOSIS — R630 Anorexia: Secondary | ICD-10-CM | POA: Diagnosis not present

## 2011-12-16 DIAGNOSIS — R350 Frequency of micturition: Secondary | ICD-10-CM | POA: Diagnosis not present

## 2011-12-16 DIAGNOSIS — N400 Enlarged prostate without lower urinary tract symptoms: Secondary | ICD-10-CM | POA: Diagnosis not present

## 2011-12-16 DIAGNOSIS — E291 Testicular hypofunction: Secondary | ICD-10-CM | POA: Diagnosis not present

## 2011-12-16 DIAGNOSIS — R569 Unspecified convulsions: Secondary | ICD-10-CM | POA: Diagnosis not present

## 2011-12-16 DIAGNOSIS — IMO0001 Reserved for inherently not codable concepts without codable children: Secondary | ICD-10-CM | POA: Diagnosis not present

## 2011-12-16 DIAGNOSIS — R5381 Other malaise: Secondary | ICD-10-CM | POA: Diagnosis not present

## 2011-12-16 DIAGNOSIS — Z79899 Other long term (current) drug therapy: Secondary | ICD-10-CM | POA: Diagnosis not present

## 2011-12-18 DIAGNOSIS — M79609 Pain in unspecified limb: Secondary | ICD-10-CM | POA: Diagnosis not present

## 2011-12-18 DIAGNOSIS — M5137 Other intervertebral disc degeneration, lumbosacral region: Secondary | ICD-10-CM | POA: Diagnosis not present

## 2011-12-18 DIAGNOSIS — M999 Biomechanical lesion, unspecified: Secondary | ICD-10-CM | POA: Diagnosis not present

## 2011-12-18 DIAGNOSIS — M25559 Pain in unspecified hip: Secondary | ICD-10-CM | POA: Diagnosis not present

## 2011-12-18 DIAGNOSIS — M545 Low back pain, unspecified: Secondary | ICD-10-CM | POA: Diagnosis not present

## 2011-12-23 DIAGNOSIS — M5137 Other intervertebral disc degeneration, lumbosacral region: Secondary | ICD-10-CM | POA: Diagnosis not present

## 2011-12-23 DIAGNOSIS — M999 Biomechanical lesion, unspecified: Secondary | ICD-10-CM | POA: Diagnosis not present

## 2011-12-24 DIAGNOSIS — R569 Unspecified convulsions: Secondary | ICD-10-CM | POA: Diagnosis not present

## 2011-12-24 DIAGNOSIS — M5137 Other intervertebral disc degeneration, lumbosacral region: Secondary | ICD-10-CM | POA: Diagnosis not present

## 2011-12-24 DIAGNOSIS — R5383 Other fatigue: Secondary | ICD-10-CM | POA: Diagnosis not present

## 2011-12-24 DIAGNOSIS — R5381 Other malaise: Secondary | ICD-10-CM | POA: Diagnosis not present

## 2011-12-24 DIAGNOSIS — E291 Testicular hypofunction: Secondary | ICD-10-CM | POA: Diagnosis not present

## 2011-12-24 DIAGNOSIS — R630 Anorexia: Secondary | ICD-10-CM | POA: Diagnosis not present

## 2011-12-24 DIAGNOSIS — M999 Biomechanical lesion, unspecified: Secondary | ICD-10-CM | POA: Diagnosis not present

## 2011-12-31 DIAGNOSIS — M5137 Other intervertebral disc degeneration, lumbosacral region: Secondary | ICD-10-CM | POA: Diagnosis not present

## 2011-12-31 DIAGNOSIS — M999 Biomechanical lesion, unspecified: Secondary | ICD-10-CM | POA: Diagnosis not present

## 2012-01-01 DIAGNOSIS — M999 Biomechanical lesion, unspecified: Secondary | ICD-10-CM | POA: Diagnosis not present

## 2012-01-01 DIAGNOSIS — M5137 Other intervertebral disc degeneration, lumbosacral region: Secondary | ICD-10-CM | POA: Diagnosis not present

## 2012-01-01 DIAGNOSIS — T887XXA Unspecified adverse effect of drug or medicament, initial encounter: Secondary | ICD-10-CM | POA: Diagnosis not present

## 2012-01-06 DIAGNOSIS — M5137 Other intervertebral disc degeneration, lumbosacral region: Secondary | ICD-10-CM | POA: Diagnosis not present

## 2012-01-06 DIAGNOSIS — M999 Biomechanical lesion, unspecified: Secondary | ICD-10-CM | POA: Diagnosis not present

## 2012-01-07 DIAGNOSIS — M5137 Other intervertebral disc degeneration, lumbosacral region: Secondary | ICD-10-CM | POA: Diagnosis not present

## 2012-01-07 DIAGNOSIS — M999 Biomechanical lesion, unspecified: Secondary | ICD-10-CM | POA: Diagnosis not present

## 2012-01-08 DIAGNOSIS — M5137 Other intervertebral disc degeneration, lumbosacral region: Secondary | ICD-10-CM | POA: Diagnosis not present

## 2012-01-08 DIAGNOSIS — M999 Biomechanical lesion, unspecified: Secondary | ICD-10-CM | POA: Diagnosis not present

## 2012-01-13 DIAGNOSIS — M5137 Other intervertebral disc degeneration, lumbosacral region: Secondary | ICD-10-CM | POA: Diagnosis not present

## 2012-01-13 DIAGNOSIS — M999 Biomechanical lesion, unspecified: Secondary | ICD-10-CM | POA: Diagnosis not present

## 2012-01-14 DIAGNOSIS — M545 Low back pain, unspecified: Secondary | ICD-10-CM | POA: Diagnosis not present

## 2012-01-14 DIAGNOSIS — IMO0002 Reserved for concepts with insufficient information to code with codable children: Secondary | ICD-10-CM | POA: Diagnosis not present

## 2012-01-15 DIAGNOSIS — M999 Biomechanical lesion, unspecified: Secondary | ICD-10-CM | POA: Diagnosis not present

## 2012-01-15 DIAGNOSIS — M5137 Other intervertebral disc degeneration, lumbosacral region: Secondary | ICD-10-CM | POA: Diagnosis not present

## 2012-01-20 DIAGNOSIS — M999 Biomechanical lesion, unspecified: Secondary | ICD-10-CM | POA: Diagnosis not present

## 2012-01-20 DIAGNOSIS — M5137 Other intervertebral disc degeneration, lumbosacral region: Secondary | ICD-10-CM | POA: Diagnosis not present

## 2012-01-22 DIAGNOSIS — M5137 Other intervertebral disc degeneration, lumbosacral region: Secondary | ICD-10-CM | POA: Diagnosis not present

## 2012-01-22 DIAGNOSIS — M999 Biomechanical lesion, unspecified: Secondary | ICD-10-CM | POA: Diagnosis not present

## 2012-01-27 DIAGNOSIS — M999 Biomechanical lesion, unspecified: Secondary | ICD-10-CM | POA: Diagnosis not present

## 2012-01-27 DIAGNOSIS — M5137 Other intervertebral disc degeneration, lumbosacral region: Secondary | ICD-10-CM | POA: Diagnosis not present

## 2012-01-29 DIAGNOSIS — M999 Biomechanical lesion, unspecified: Secondary | ICD-10-CM | POA: Diagnosis not present

## 2012-01-29 DIAGNOSIS — M5137 Other intervertebral disc degeneration, lumbosacral region: Secondary | ICD-10-CM | POA: Diagnosis not present

## 2012-02-05 DIAGNOSIS — M5137 Other intervertebral disc degeneration, lumbosacral region: Secondary | ICD-10-CM | POA: Diagnosis not present

## 2012-02-05 DIAGNOSIS — M999 Biomechanical lesion, unspecified: Secondary | ICD-10-CM | POA: Diagnosis not present

## 2012-02-06 DIAGNOSIS — Z5181 Encounter for therapeutic drug level monitoring: Secondary | ICD-10-CM | POA: Diagnosis not present

## 2012-02-06 DIAGNOSIS — G40309 Generalized idiopathic epilepsy and epileptic syndromes, not intractable, without status epilepticus: Secondary | ICD-10-CM | POA: Diagnosis not present

## 2012-02-09 ENCOUNTER — Inpatient Hospital Stay (HOSPITAL_COMMUNITY): Payer: Medicare Other

## 2012-02-09 ENCOUNTER — Other Ambulatory Visit: Payer: Self-pay

## 2012-02-09 ENCOUNTER — Encounter (HOSPITAL_COMMUNITY): Payer: Self-pay | Admitting: Emergency Medicine

## 2012-02-09 ENCOUNTER — Emergency Department (HOSPITAL_COMMUNITY): Payer: Medicare Other

## 2012-02-09 ENCOUNTER — Inpatient Hospital Stay (HOSPITAL_COMMUNITY)
Admission: EM | Admit: 2012-02-09 | Discharge: 2012-02-18 | DRG: 871 | Disposition: A | Discharge status: Home Health | Payer: Medicare Other | Attending: Internal Medicine | Admitting: Internal Medicine

## 2012-02-09 DIAGNOSIS — R6510 Systemic inflammatory response syndrome (SIRS) of non-infectious origin without acute organ dysfunction: Secondary | ICD-10-CM | POA: Diagnosis present

## 2012-02-09 DIAGNOSIS — R05 Cough: Secondary | ICD-10-CM | POA: Diagnosis not present

## 2012-02-09 DIAGNOSIS — J9589 Other postprocedural complications and disorders of respiratory system, not elsewhere classified: Secondary | ICD-10-CM | POA: Diagnosis not present

## 2012-02-09 DIAGNOSIS — F05 Delirium due to known physiological condition: Secondary | ICD-10-CM | POA: Diagnosis present

## 2012-02-09 DIAGNOSIS — J690 Pneumonitis due to inhalation of food and vomit: Secondary | ICD-10-CM

## 2012-02-09 DIAGNOSIS — A0472 Enterocolitis due to Clostridium difficile, not specified as recurrent: Secondary | ICD-10-CM | POA: Diagnosis not present

## 2012-02-09 DIAGNOSIS — R569 Unspecified convulsions: Secondary | ICD-10-CM | POA: Diagnosis present

## 2012-02-09 DIAGNOSIS — K828 Other specified diseases of gallbladder: Secondary | ICD-10-CM | POA: Diagnosis not present

## 2012-02-09 DIAGNOSIS — J9600 Acute respiratory failure, unspecified whether with hypoxia or hypercapnia: Secondary | ICD-10-CM

## 2012-02-09 DIAGNOSIS — R509 Fever, unspecified: Secondary | ICD-10-CM | POA: Diagnosis not present

## 2012-02-09 DIAGNOSIS — E119 Type 2 diabetes mellitus without complications: Secondary | ICD-10-CM | POA: Diagnosis present

## 2012-02-09 DIAGNOSIS — J80 Acute respiratory distress syndrome: Secondary | ICD-10-CM

## 2012-02-09 DIAGNOSIS — G9340 Encephalopathy, unspecified: Secondary | ICD-10-CM | POA: Diagnosis not present

## 2012-02-09 DIAGNOSIS — R6521 Severe sepsis with septic shock: Secondary | ICD-10-CM | POA: Diagnosis present

## 2012-02-09 DIAGNOSIS — D72829 Elevated white blood cell count, unspecified: Secondary | ICD-10-CM

## 2012-02-09 DIAGNOSIS — G40909 Epilepsy, unspecified, not intractable, without status epilepticus: Secondary | ICD-10-CM | POA: Diagnosis not present

## 2012-02-09 DIAGNOSIS — I469 Cardiac arrest, cause unspecified: Secondary | ICD-10-CM | POA: Diagnosis not present

## 2012-02-09 DIAGNOSIS — A419 Sepsis, unspecified organism: Secondary | ICD-10-CM | POA: Diagnosis present

## 2012-02-09 DIAGNOSIS — R140 Abdominal distension (gaseous): Secondary | ICD-10-CM | POA: Diagnosis present

## 2012-02-09 DIAGNOSIS — J9819 Other pulmonary collapse: Secondary | ICD-10-CM | POA: Diagnosis not present

## 2012-02-09 DIAGNOSIS — R6520 Severe sepsis without septic shock: Secondary | ICD-10-CM

## 2012-02-09 DIAGNOSIS — D696 Thrombocytopenia, unspecified: Secondary | ICD-10-CM | POA: Diagnosis present

## 2012-02-09 DIAGNOSIS — R143 Flatulence: Secondary | ICD-10-CM

## 2012-02-09 DIAGNOSIS — R748 Abnormal levels of other serum enzymes: Secondary | ICD-10-CM | POA: Diagnosis not present

## 2012-02-09 DIAGNOSIS — R197 Diarrhea, unspecified: Secondary | ICD-10-CM | POA: Diagnosis not present

## 2012-02-09 DIAGNOSIS — Z87891 Personal history of nicotine dependence: Secondary | ICD-10-CM

## 2012-02-09 DIAGNOSIS — Z09 Encounter for follow-up examination after completed treatment for conditions other than malignant neoplasm: Secondary | ICD-10-CM | POA: Diagnosis not present

## 2012-02-09 DIAGNOSIS — A4189 Other specified sepsis: Secondary | ICD-10-CM | POA: Diagnosis not present

## 2012-02-09 DIAGNOSIS — G931 Anoxic brain damage, not elsewhere classified: Secondary | ICD-10-CM

## 2012-02-09 DIAGNOSIS — N390 Urinary tract infection, site not specified: Secondary | ICD-10-CM | POA: Diagnosis not present

## 2012-02-09 DIAGNOSIS — R059 Cough, unspecified: Secondary | ICD-10-CM | POA: Diagnosis not present

## 2012-02-09 DIAGNOSIS — R142 Eructation: Secondary | ICD-10-CM | POA: Diagnosis not present

## 2012-02-09 DIAGNOSIS — R9431 Abnormal electrocardiogram [ECG] [EKG]: Secondary | ICD-10-CM | POA: Diagnosis not present

## 2012-02-09 DIAGNOSIS — K7689 Other specified diseases of liver: Secondary | ICD-10-CM | POA: Diagnosis not present

## 2012-02-09 DIAGNOSIS — R5381 Other malaise: Secondary | ICD-10-CM | POA: Diagnosis present

## 2012-02-09 DIAGNOSIS — R578 Other shock: Secondary | ICD-10-CM | POA: Diagnosis not present

## 2012-02-09 DIAGNOSIS — E872 Acidosis, unspecified: Secondary | ICD-10-CM | POA: Diagnosis present

## 2012-02-09 DIAGNOSIS — D649 Anemia, unspecified: Secondary | ICD-10-CM | POA: Diagnosis not present

## 2012-02-09 DIAGNOSIS — E118 Type 2 diabetes mellitus with unspecified complications: Secondary | ICD-10-CM | POA: Diagnosis present

## 2012-02-09 DIAGNOSIS — R141 Gas pain: Secondary | ICD-10-CM

## 2012-02-09 DIAGNOSIS — R0902 Hypoxemia: Secondary | ICD-10-CM

## 2012-02-09 DIAGNOSIS — I10 Essential (primary) hypertension: Secondary | ICD-10-CM | POA: Diagnosis not present

## 2012-02-09 DIAGNOSIS — R5600 Simple febrile convulsions: Secondary | ICD-10-CM | POA: Diagnosis not present

## 2012-02-09 HISTORY — DX: Sleep apnea, unspecified: G47.30

## 2012-02-09 LAB — BASIC METABOLIC PANEL
BUN: 17 mg/dL (ref 6–23)
CO2: 25 mEq/L (ref 19–32)
Chloride: 89 mEq/L — ABNORMAL LOW (ref 96–112)
Glucose, Bld: 270 mg/dL — ABNORMAL HIGH (ref 70–99)
Potassium: 4.2 mEq/L (ref 3.5–5.1)

## 2012-02-09 LAB — CBC WITH DIFFERENTIAL/PLATELET
Basophils Relative: 0 % (ref 0–1)
Eosinophils Absolute: 0 10*3/uL (ref 0.0–0.7)
HCT: 36.5 % — ABNORMAL LOW (ref 39.0–52.0)
Hemoglobin: 13 g/dL (ref 13.0–17.0)
Lymphs Abs: 0.7 10*3/uL (ref 0.7–4.0)
MCH: 32.8 pg (ref 26.0–34.0)
MCHC: 35.6 g/dL (ref 30.0–36.0)
Monocytes Absolute: 1.4 10*3/uL — ABNORMAL HIGH (ref 0.1–1.0)
Monocytes Relative: 10 % (ref 3–12)

## 2012-02-09 LAB — CARBOXYHEMOGLOBIN
Carboxyhemoglobin: 1.2 % (ref 0.5–1.5)
Carboxyhemoglobin: 1 % (ref 0.5–1.5)
Carboxyhemoglobin: 1.2 % (ref 0.5–1.5)
Methemoglobin: 1 % (ref 0.0–1.5)
Methemoglobin: 1 % (ref 0.0–1.5)
Methemoglobin: 1.2 % (ref 0.0–1.5)
O2 Saturation: 51.5 %
O2 Saturation: 58.6 %
O2 Saturation: 59.7 %
Total hemoglobin: 11.3 g/dL — ABNORMAL LOW (ref 13.5–18.0)
Total hemoglobin: 10.4 g/dL — ABNORMAL LOW (ref 13.5–18.0)

## 2012-02-09 LAB — CBC
MCH: 32.4 pg (ref 26.0–34.0)
MCV: 93.8 fL (ref 78.0–100.0)
Platelets: 86 10*3/uL — ABNORMAL LOW (ref 150–400)
RDW: 17.4 % — ABNORMAL HIGH (ref 11.5–15.5)
WBC: 9 10*3/uL (ref 4.0–10.5)

## 2012-02-09 LAB — CARDIAC PANEL(CRET KIN+CKTOT+MB+TROPI)
Relative Index: 1.3 (ref 0.0–2.5)
Relative Index: 1.8 (ref 0.0–2.5)
Total CK: 343 U/L — ABNORMAL HIGH (ref 7–232)
Troponin I: <0.3 ng/mL (ref <0.30)

## 2012-02-09 LAB — URINE MICROSCOPIC-ADD ON

## 2012-02-09 LAB — URINALYSIS, ROUTINE W REFLEX MICROSCOPIC
Glucose, UA: 100 mg/dL — AB
Ketones, ur: NEGATIVE mg/dL
Protein, ur: 100 mg/dL — AB
pH: 7 (ref 5.0–8.0)

## 2012-02-09 LAB — DIC (DISSEMINATED INTRAVASCULAR COAGULATION)PANEL
Fibrinogen: 758 mg/dL — ABNORMAL HIGH (ref 204–475)
INR: 1.34 (ref 0.00–1.49)
Prothrombin Time: 16.8 seconds — ABNORMAL HIGH (ref 11.6–15.2)
aPTT: 37 seconds (ref 24–37)

## 2012-02-09 LAB — GLUCOSE, CAPILLARY
Glucose-Capillary: 188 mg/dL — ABNORMAL HIGH (ref 70–99)
Glucose-Capillary: 205 mg/dL — ABNORMAL HIGH (ref 70–99)

## 2012-02-09 LAB — TECHNOLOGIST SMEAR REVIEW: Tech Review: INCREASED

## 2012-02-09 LAB — CREATININE, SERUM: GFR calc Af Amer: 82 mL/min — ABNORMAL LOW (ref >90)

## 2012-02-09 LAB — CLOSTRIDIUM DIFFICILE BY PCR: Toxigenic C. Difficile by PCR: POSITIVE — AB

## 2012-02-09 LAB — TROPONIN I: Troponin I: <0.3 ng/mL (ref <0.30)

## 2012-02-09 LAB — ABO/RH: ABO/RH(D): O POS

## 2012-02-09 LAB — HEPATIC FUNCTION PANEL
Bilirubin, Direct: 0.6 mg/dL — ABNORMAL HIGH (ref 0.0–0.3)
Indirect Bilirubin: 0.4 mg/dL (ref 0.3–0.9)

## 2012-02-09 LAB — CK TOTAL AND CKMB (NOT AT ARMC)
Relative Index: 1 (ref 0.0–2.5)
Total CK: 443 U/L — ABNORMAL HIGH (ref 7–232)

## 2012-02-09 LAB — TYPE AND SCREEN: Antibody Screen: NEGATIVE

## 2012-02-09 LAB — HEMOGLOBIN A1C: Hgb A1c MFr Bld: 6.2 % — ABNORMAL HIGH (ref <5.7)

## 2012-02-09 LAB — MRSA PCR SCREENING: MRSA by PCR: NEGATIVE

## 2012-02-09 MED ORDER — IBUPROFEN 400 MG PO TABS
200.0000 mg | ORAL_TABLET | Freq: Once | ORAL | Status: DC
  Filled 2012-02-09: qty 1

## 2012-02-09 MED ORDER — SODIUM CHLORIDE 0.9 % IV SOLN: INTRAVENOUS | Status: DC

## 2012-02-09 MED ORDER — ADULT MULTIVITAMIN W/MINERALS CH
1.0000 | ORAL_TABLET | Freq: Every day | ORAL | Status: DC
  Administered 2012-02-10 – 2012-02-18 (×8): 1 via ORAL
  Filled 2012-02-09 (×10): qty 1

## 2012-02-09 MED ORDER — VANCOMYCIN HCL IN DEXTROSE 1-5 GM/200ML-% IV SOLN
1000.0000 mg | INTRAVENOUS | Status: DC
  Administered 2012-02-09: 1000 mg via INTRAVENOUS
  Filled 2012-02-09: qty 200

## 2012-02-09 MED ORDER — ACETAMINOPHEN 650 MG RE SUPP: 650.0000 mg | Freq: Four times a day (QID) | RECTAL | Status: DC | PRN

## 2012-02-09 MED ORDER — PHENYTOIN SODIUM EXTENDED 100 MG PO CAPS
300.0000 mg | ORAL_CAPSULE | Freq: Every day | ORAL | Status: DC
  Administered 2012-02-09 – 2012-02-10 (×2): 300 mg via ORAL
  Filled 2012-02-09 (×3): qty 3

## 2012-02-09 MED ORDER — SODIUM CHLORIDE 0.9 % IV SOLN
1250.0000 mg | INTRAVENOUS | Status: DC
  Administered 2012-02-10 – 2012-02-13 (×4): 1250 mg via INTRAVENOUS
  Filled 2012-02-09 (×5): qty 1250

## 2012-02-09 MED ORDER — INSULIN ASPART 100 UNIT/ML ~~LOC~~ SOLN: 0.0000 [IU] | Freq: Three times a day (TID) | SUBCUTANEOUS | Status: DC

## 2012-02-09 MED ORDER — WHITE PETROLATUM GEL
Status: AC
  Administered 2012-02-09: 21:00:00
  Filled 2012-02-09: qty 5

## 2012-02-09 MED ORDER — PHENYTOIN SODIUM EXTENDED 100 MG PO CAPS: 200.0000 mg | ORAL_CAPSULE | Freq: Every day | ORAL | Status: DC

## 2012-02-09 MED ORDER — DEXTROSE 5 % IV SOLN
2.0000 g | INTRAVENOUS | Status: DC
  Filled 2012-02-09: qty 2

## 2012-02-09 MED ORDER — PHENYTOIN 50 MG PO CHEW: 50.0000 mg | CHEWABLE_TABLET | Freq: Every day | ORAL | Status: DC

## 2012-02-09 MED ORDER — ASPIRIN 81 MG PO CHEW
81.0000 mg | CHEWABLE_TABLET | Freq: Every day | ORAL | Status: DC
  Administered 2012-02-09 – 2012-02-18 (×9): 81 mg via ORAL
  Filled 2012-02-09 (×9): qty 1

## 2012-02-09 MED ORDER — SODIUM CHLORIDE 0.9 % IV BOLUS (SEPSIS)
1000.0000 mL | Freq: Once | INTRAVENOUS | Status: AC
  Administered 2012-02-09: 1000 mL via INTRAVENOUS

## 2012-02-09 MED ORDER — VASOPRESSIN 20 UNIT/ML IJ SOLN
0.0300 [IU]/min | INTRAVENOUS | Status: DC | PRN
  Filled 2012-02-09: qty 2.5

## 2012-02-09 MED ORDER — DEXTROSE 10 % IV SOLN: INTRAVENOUS | Status: DC | PRN

## 2012-02-09 MED ORDER — ONDANSETRON HCL 4 MG/2ML IJ SOLN: 4.0000 mg | Freq: Four times a day (QID) | INTRAMUSCULAR | Status: DC | PRN

## 2012-02-09 MED ORDER — VANCOMYCIN 50 MG/ML ORAL SOLUTION
500.0000 mg | Freq: Four times a day (QID) | ORAL | Status: DC
  Administered 2012-02-09 – 2012-02-18 (×35): 500 mg via ORAL
  Filled 2012-02-09 (×39): qty 10

## 2012-02-09 MED ORDER — METRONIDAZOLE IN NACL 5-0.79 MG/ML-% IV SOLN
500.0000 mg | Freq: Three times a day (TID) | INTRAVENOUS | Status: DC
  Administered 2012-02-09 – 2012-02-14 (×14): 500 mg via INTRAVENOUS
  Filled 2012-02-09 (×17): qty 100

## 2012-02-09 MED ORDER — DEXTROSE 5 % IV SOLN
1.0000 g | INTRAVENOUS | Status: DC
  Filled 2012-02-09: qty 10

## 2012-02-09 MED ORDER — HYDROCORTISONE SOD SUCCINATE 100 MG IJ SOLR
50.0000 mg | Freq: Four times a day (QID) | INTRAMUSCULAR | Status: DC
  Administered 2012-02-09 – 2012-02-10 (×4): 50 mg via INTRAVENOUS
  Filled 2012-02-09 (×8): qty 1

## 2012-02-09 MED ORDER — SODIUM CHLORIDE 0.9 % IV SOLN
500.0000 mg | Freq: Once | INTRAVENOUS | Status: AC
  Administered 2012-02-09: 500 mg via INTRAVENOUS
  Filled 2012-02-09: qty 10

## 2012-02-09 MED ORDER — CHLORHEXIDINE GLUCONATE 0.12 % MT SOLN
OROMUCOSAL | Status: AC
  Administered 2012-02-09: 15 mL via OROMUCOSAL
  Filled 2012-02-09: qty 15

## 2012-02-09 MED ORDER — PIPERACILLIN-TAZOBACTAM 3.375 G IVPB
3.3750 g | Freq: Three times a day (TID) | INTRAVENOUS | Status: DC
  Administered 2012-02-09 – 2012-02-14 (×14): 3.375 g via INTRAVENOUS
  Filled 2012-02-09 (×18): qty 50

## 2012-02-09 MED ORDER — ENOXAPARIN SODIUM 40 MG/0.4ML ~~LOC~~ SOLN
40.0000 mg | SUBCUTANEOUS | Status: DC
  Filled 2012-02-09: qty 0.4

## 2012-02-09 MED ORDER — METOPROLOL TARTRATE 25 MG PO TABS
25.0000 mg | ORAL_TABLET | Freq: Every day | ORAL | Status: DC
  Filled 2012-02-09: qty 1

## 2012-02-09 MED ORDER — HYDROCODONE-ACETAMINOPHEN 5-325 MG PO TABS: 1.0000 | ORAL_TABLET | ORAL | Status: DC | PRN

## 2012-02-09 MED ORDER — CHLORHEXIDINE GLUCONATE 0.12 % MT SOLN
15.0000 mL | Freq: Two times a day (BID) | OROMUCOSAL | Status: DC
  Administered 2012-02-09 – 2012-02-14 (×8): 15 mL via OROMUCOSAL
  Filled 2012-02-09 (×13): qty 15

## 2012-02-09 MED ORDER — BIOTENE DRY MOUTH MT LIQD
15.0000 mL | Freq: Two times a day (BID) | OROMUCOSAL | Status: DC
  Administered 2012-02-10 – 2012-02-16 (×14): 15 mL via OROMUCOSAL

## 2012-02-09 MED ORDER — IBUPROFEN 400 MG PO TABS
600.0000 mg | ORAL_TABLET | Freq: Once | ORAL | Status: AC
  Administered 2012-02-09: 600 mg via ORAL
  Filled 2012-02-09: qty 1

## 2012-02-09 MED ORDER — SODIUM CHLORIDE 0.9 % IV SOLN
1000.0000 mg | Freq: Once | INTRAVENOUS | Status: DC
  Filled 2012-02-09: qty 20

## 2012-02-09 MED ORDER — ALUM & MAG HYDROXIDE-SIMETH 200-200-20 MG/5ML PO SUSP
30.0000 mL | Freq: Four times a day (QID) | ORAL | Status: DC | PRN
  Administered 2012-02-15: 30 mL via ORAL
  Filled 2012-02-09: qty 30

## 2012-02-09 MED ORDER — DEXTROSE 5 % IV SOLN
2.0000 ug/min | INTRAVENOUS | Status: DC | PRN
  Filled 2012-02-09: qty 4

## 2012-02-09 MED ORDER — DEXTROSE 5 % IV SOLN: 1.0000 g | INTRAVENOUS | Status: DC

## 2012-02-09 MED ORDER — INSULIN ASPART 100 UNIT/ML ~~LOC~~ SOLN: 0.0000 [IU] | Freq: Every day | SUBCUTANEOUS | Status: DC

## 2012-02-09 MED ORDER — FAMOTIDINE IN NACL 20-0.9 MG/50ML-% IV SOLN
20.0000 mg | INTRAVENOUS | Status: DC
  Administered 2012-02-09: 20 mg via INTRAVENOUS
  Filled 2012-02-09 (×2): qty 50

## 2012-02-09 MED ORDER — SODIUM CHLORIDE 0.9 % IV BOLUS (SEPSIS): 500.0000 mL | INTRAVENOUS | Status: DC | PRN

## 2012-02-09 MED ORDER — INSULIN ASPART 100 UNIT/ML ~~LOC~~ SOLN
0.0000 [IU] | SUBCUTANEOUS | Status: DC
  Administered 2012-02-09 (×2): 4 [IU] via SUBCUTANEOUS
  Administered 2012-02-09: 3 [IU] via SUBCUTANEOUS
  Administered 2012-02-10: 1 [IU] via SUBCUTANEOUS
  Administered 2012-02-10: 3 [IU] via SUBCUTANEOUS
  Administered 2012-02-10: 1 [IU] via SUBCUTANEOUS
  Administered 2012-02-11 (×2): 3 [IU] via SUBCUTANEOUS
  Administered 2012-02-11 – 2012-02-12 (×2): 1 [IU] via SUBCUTANEOUS
  Administered 2012-02-12: 3 [IU] via SUBCUTANEOUS
  Administered 2012-02-12 (×2): 1 [IU] via SUBCUTANEOUS
  Administered 2012-02-13 (×2): 3 [IU] via SUBCUTANEOUS
  Administered 2012-02-13 (×2): 1 [IU] via SUBCUTANEOUS

## 2012-02-09 MED ORDER — ONDANSETRON HCL 4 MG PO TABS: 4.0000 mg | ORAL_TABLET | Freq: Four times a day (QID) | ORAL | Status: DC | PRN

## 2012-02-09 MED ORDER — DOBUTAMINE IN D5W 4-5 MG/ML-% IV SOLN
2.5000 ug/kg/min | INTRAVENOUS | Status: DC | PRN
  Administered 2012-02-09: 2.5 ug/kg/min via INTRAVENOUS
  Filled 2012-02-09 (×2): qty 250

## 2012-02-09 MED ORDER — PHENYTOIN 50 MG PO CHEW
300.0000 mg | CHEWABLE_TABLET | Freq: Every day | ORAL | Status: DC
  Filled 2012-02-09: qty 6

## 2012-02-09 MED ORDER — SODIUM CHLORIDE 0.9 % IV BOLUS (SEPSIS): 25.0000 mL/kg | Freq: Once | INTRAVENOUS | Status: AC

## 2012-02-09 MED ORDER — DEXTROSE 5 % IV SOLN
1.0000 g | Freq: Once | INTRAVENOUS | Status: AC
  Administered 2012-02-09: 1 g via INTRAVENOUS
  Filled 2012-02-09: qty 10

## 2012-02-09 MED ORDER — SODIUM CHLORIDE 0.9 % IV BOLUS (SEPSIS): 500.0000 mL | Freq: Once | INTRAVENOUS | Status: DC

## 2012-02-09 MED ORDER — ACETAMINOPHEN 325 MG PO TABS
650.0000 mg | ORAL_TABLET | Freq: Four times a day (QID) | ORAL | Status: DC | PRN
  Administered 2012-02-15 – 2012-02-16 (×2): 650 mg via ORAL
  Filled 2012-02-09 (×2): qty 2

## 2012-02-09 MED ORDER — SODIUM CHLORIDE 0.9 % IV SOLN
INTRAVENOUS | Status: DC
  Administered 2012-02-09 – 2012-02-10 (×3): via INTRAVENOUS

## 2012-02-09 MED ORDER — HYDROMORPHONE HCL PF 1 MG/ML IJ SOLN: 1.0000 mg | INTRAMUSCULAR | Status: DC | PRN

## 2012-02-09 MED ORDER — ACETAMINOPHEN 325 MG PO TABS
650.0000 mg | ORAL_TABLET | Freq: Once | ORAL | Status: AC
  Administered 2012-02-09: 650 mg via ORAL
  Filled 2012-02-09: qty 2

## 2012-02-09 NOTE — ED Notes (Signed)
Report given to [facility], Charity fundraiser. Dilantin sent upstairs with patient, floor nurse aware.

## 2012-02-09 NOTE — ED Notes (Signed)
Admitting physician at the bedside to assess patient. Lab also at bedside to draw. Pt clammy, remains tachycardic on monitor. Wife and family at bedside. Pt is alert and oriented x4.
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
## 2012-02-09 NOTE — Progress Notes (Signed)
CRITICAL VALUE ALERT  Critical value received:  Positive C-Diff   Date of notification:  02/09/2012  Time of notification:  17:54  Critical value read back:yes  Nurse who received alert:  Verlon Au, RN   MD notified (1st page):  Dr. Marin Shutter  Time of first page:  18:00  MD notified (2nd page):  Time of second page:  Responding MD:  Dr. Marin Shutter  Time MD responded:  18:00
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
## 2012-02-09 NOTE — H&P (Addendum)
History and Physical       Hospital Admission Note Date: 02/09/2012  Patient name: Joseph Copeland Medical record number: [id number] Date of birth: Oct 11, 1940 Age: 71 y.o. Gender: male PCP: GATES,ROBERT NEVILL, MD   Chief Complaint:  Seizure today per family with fever and chills since yesterday  HPI: Patient is a 71 year old male with history of hypertension, arthritis, seizures (first in December 2012, followed by Dr. Anne Hahn), diabetes was brought to [facility] ED with Chills, fevers and a seizure episode today. History was obtained from the patient and his family in the room. Per patient's wife, he has "not been himself "in the last week, having very poor appetite, diarrhea (1-2 watery BM/day), increasing generalized weakness. Patient was having chills and diaphoresis yesterday and a temp of 102 at home. Per patient he has intermittent cough with productive phlegm about no chest pain or any wheezing. Per patient's wife she found him on the floor this morning (per patient he feels better lying on a hard surface due to his chronic hip and back pain) and she heard him making a strange noise and was having a tonic-clonic seizure when she saw him. She called the EMS and patient was noted to be back at his baseline.  In ED, patient was noted to have fever of 102.9, tachycardiac with HR in the 120-150's (sinus), leukocytosis, WBC 14.9, hypernatremia 130, UA grossly positive for UTI, chest x-ray negative for any pneumonia. Dilantin levels was low at 6.9. Offnote, and per family, patient was on Dilantin 300 mg qhs from 12/12 until end of May when his dose was decreased by PCP to 200mg  qhs, then increased by Dr Anne Hahn to 250mg  qhs on Thurs 8/15, but patient filled it yesterday and took first 250mg  dose last night.   Review of Systems:  Constitutional:  See HPI  HEENT: Denies photophobia, eye pain, redness, hearing loss, ear pain, congestion, sore throat,  rhinorrhea, sneezing, mouth sores, trouble swallowing, neck pain, neck stiffness and tinnitus.   Respiratory: Denies SOB, DOE, chest tightness,  and wheezing.  patient also endorses having productive cough  Cardiovascular: Denies chest pain, palpitations and leg swelling.  Gastrointestinal: please see history of present illness   Genitourinary: Even though patient has UTI he denied having any dysuria, hematuria, flank pain and difficulty urinating.  Musculoskeletal:   he has chronic hip pain and back pain and ambulates with a cane Skin: Denies pallor, rash and wound.  Neurological: Denies dizziness, syncope, light-headedness, numbness and headaches. he endorses increasing generalized weakness. Please see history of present illness regarding seizure   Hematological: Denies adenopathy. Easy bruising, personal or family bleeding history  Psychiatric/Behavioral: Denies suicidal ideation, mood changes, confusion, nervousness, sleep disturbance and agitation  Past Medical History: Past Medical History  Diagnosis Date  . Hypertension   . Arthritis   . Seizures     first12/12-none since  . Diabetes mellitus     type 2-no meds   Past Surgical History  Procedure Date  . Hemrhoidectomy   . Colonoscopy   . Mass excision 10/07/2011    Procedure: EXCISION MASS;  Surgeon: Louisa Second, MD;  Location: [facility];  Service: Plastics;  Laterality: Left;  excision of large mass on left back with possible lipo assistence  . Liposuction 10/07/2011    Procedure: LIPOSUCTION;  Surgeon: Louisa Second, MD;  Location: [facility];  Service: Plastics;  Laterality: Left;    Medications: Prior to Admission medications   Medication Sig Start Date End Date  Taking? Authorizing Provider  aspirin 81 MG chewable tablet Chew 81 mg by mouth daily.   Yes Historical Provider, MD  Cyanocobalamin (VITAMIN B 12 PO) Take 1 tablet by mouth daily.     Yes Historical Provider, MD  fish  oil-omega-3 fatty acids 1000 MG capsule Take 2 g by mouth daily.     Yes Historical Provider, MD  ibuprofen (ADVIL,MOTRIN) 200 MG tablet Take 200 mg by mouth every 6 (six) hours as needed. For pain   Yes Historical Provider, MD  metoprolol tartrate (LOPRESSOR) 25 MG tablet Take 25 mg by mouth daily.   Yes Historical Provider, MD  Multiple Vitamin (MULTIVITAMIN WITH MINERALS) TABS Take 1 tablet by mouth daily.   Yes Historical Provider, MD  phenytoin (DILANTIN) 100 MG ER capsule Take 200 mg by mouth at bedtime. Total dose =250mg    Yes Historical Provider, MD  phenytoin (DILANTIN) 50 MG tablet Chew 50 mg by mouth at bedtime. Takes with 2 100mg  capsules   Yes Historical Provider, MD  testosterone (ANDROGEL) 50 MG/5GM GEL Place 5 g onto the skin daily.   Yes Historical Provider, MD    Allergies:  No Known Allergies  Social History:  reports that he quit smoking about 45 years ago. He has never used smokeless tobacco. He reports that he drinks about 12.6 ounces of alcohol per week. He reports that he does not use illicit drugs.he currently lives at home with his family, ambulates with a cane   Family History: Parents deceased, No history of any coronary disease.   Physical Exam: Blood pressure 124/76, pulse 153, temperature 102.9 F (39.4 C), temperature source Oral, resp. rate 18, SpO2 96.00%. General: Alert, awake, oriented x3, clammy and sweaty  HEENT: normocephalic, atraumatic, anicteric sclera, pink conjunctiva, pupils equal and reactive to light and accomodation, oropharynx clear Neck: supple, no masses or lymphadenopathy, no goiter, no bruits  Heart:  tachycardiac Regular rate and rhythm, without murmurs, rubs or gallops. Lungs: Clear to auscultation bilaterally, no wheezing, rales or rhonchi. Abdomen: Soft, +distended, positive bowel sounds, no masses, nontender . Extremities: No clubbing, cyanosis or edema with positive pedal pulses. Neuro: Grossly intact, no focal neurological  deficits, strength 5/5 upper and lower extremities bilaterally Psych: alert and oriented x 3, normal mood and affect Skin: no rashes or lesions, warm and dry   LABS on Admission:  Basic Metabolic Panel:  Lab 02/09/12 1610  NA 130*  K 4.2  CL 89*  CO2 25  GLUCOSE 270*  BUN 17  CREATININE 0.78  CALCIUM 9.8  MG --  PHOS --   CBC:  Lab 02/09/12 0606  WBC 14.9*  NEUTROABS 12.3*  HGB 13.0  HCT 36.5*  MCV 92.2  PLT 148*   Cardiac Enzymes: Pending  CBG:  Lab 02/09/12 0556  GLUCAP 266*     Radiological Exams on Admission: Dg Chest 2 View  02/09/2012  *RADIOLOGY REPORT*  Clinical Data: Fever.  Cough.  Seizures.  CHEST - 2 VIEW  Comparison: 06/03/2011  Findings: The heart size and pulmonary vascularity are normal. The lungs appear clear and expanded without focal air space disease or consolidation. No blunting of the costophrenic angles.  No pneumothorax.  Mediastinal contours appear intact.  Calcification of the aorta.  Degenerative changes in the spine.  No significant change since previous study.  IMPRESSION: No evidence of active pulmonary disease.  Original Report Authenticated By: Marlon Pel, M.D.    Assessment/Plan Present on Admission:  .SIRS (systemic inflammatory response syndrome) with fever  and tachycardia - Admit to step down unit, obtain lactic acid, procalcitonin, stat blood cultures, urine cultures, aggressive IV fluid resuscitation.   - Placed on broad-spectrum vancomycin and Rocephin. Patient does endorse some coughing with productive phlegm however chest x-ray is currently negative for pneumonia. We'll repeat a chest x-ray after some fluid resuscitation. -  Obtain KUB for abdominal distention, diarrhea, C. Difficile PCR.   Marland KitchenUTI (lower urinary tract infection): - Obtain urine culture, placed on Vanc and rocephin until cultures are available.  .Seizures, generalized convulsive: Likely breakthrough seizure precipitated by sepsis/UTI, Dilantin level  6.9  - Will treat underlying cause, discussed with neurology for consult. - Neurology recommended Dilantin 500 mg IV x1 for now and further management deferred to neurology service.      .Abdominal distension: ? Etiology  - Patient had abdominal ultrasound in 12/ 2012 and had shown hepatic steatosis with increased liver echogenicity, minimally irregular lesion in the right lobe 1.9 cm. - Will obtain liver function tests, abdominal ultrasound to assess for ascites or any liver cirrhosis.  .DM (diabetes mellitus), type 2 - Obtain hemoglobin A1c, place on sliding scale insulin  .HTN (hypertension): Continue Lopressor   DVT prophylaxis:  LOVENOX  CODE STATUS:  discussed in detail with the patient, Full CODE STATUS   Further plan will depend as patient's clinical course evolves and further radiologic and laboratory data become available.   Time Spent on Admission:  1-1/2 hour  RAI,RIPUDEEP M.D. [facility] 02/09/2012, 11:23 AM Pager: [phone]  If 7PM-7AM, please contact night-coverage [email or web address] Password [email or web address]  Addendum; - Patient reassessed and called by RN Mardene Celeste from 3300, BP 76/55, MAP 59, HR 127, sinus tachycardia, pulse ox 99% RA, patient is alert and awake, ordered one more NS IV fluid bolus. Lactic acid 4.1, Procalcitonin 46.3. No acute findings on abdominal x-ray. Alkaline phosphatase is elevated at 409 with AST 49, abdominal ultrasound is ordered to evaluate for abdominal distention/ascites, any obstruction or biliary dilatation.  Discussed in detail with the patient's family for necessity for transfer to intensive care unit and they are in agreement with the plan of management. Discussed with CCM on elink, Dr. Kendrick Fries, will see patient in ICU.   RAI,RIPUDEEP M.D. [facility] 02/09/2012, 1:07 PM  Pager: [phone]

## 2012-02-09 NOTE — Progress Notes (Addendum)
MEDICATION RELATED CONSULT NOTE - FOLLOW UP   Pharmacy Consult for Vancomycin, Ceftriaxone, Dilantin Indication: Septic shock, breakthrough seizures  No Known Allergies  Patient Measurements: Height: 5' 1.81" (157 cm) Weight: 134 lb 14.7 oz (61.2 kg) IBW/kg (Calculated) : 54.17    Vital Signs: Temp: 99.8 F (37.7 C) (08/18 1210) Temp src: Oral (08/18 0857) BP: 91/64 mmHg (08/18 1330) Pulse Rate: 119  (08/18 1330) Intake/Output from previous day:   Intake/Output from this shift: Total I/O In: 2000 [I.V.:2000] Out: -   Labs:  Basename 02/09/12 1242 02/09/12 1038 02/09/12 0606  WBC 9.0 -- 14.9*  HGB 11.4* -- 13.0  HCT 33.0* -- 36.5*  PLT PENDING -- 148*  APTT -- -- --  CREATININE 1.04 -- 0.78  LABCREA -- -- --  CREATININE 1.04 -- 0.78  CREAT24HRUR -- -- --  MG -- -- --  PHOS -- -- --  ALBUMIN -- 2.6* --  PROT -- 6.0 --  ALBUMIN -- 2.6* --  AST -- 49* --  ALT -- 34 --  ALKPHOS -- 409* --  BILITOT -- 1.0 --  BILIDIR -- 0.6* --  IBILI -- 0.4 --   Estimated Creatinine Clearance: 50.7 ml/min (by C-G formula based on Cr of 1.04).   Microbiology: 8/18 UCx >> 8/18 BCx x2 >> drawn after CTX given in ED 8/18 C.diff >>  Assessment: 71 yo M admitted 8/18 w breakthrough seizure, fever, and chills on Dilantin PTA. Patient now placed on sepsis protocol.  Tm 102.9, WBC 9<14.9, PCT 46.35, lactic acid 4.1. UA: turbid, positive nitrites, large leukocytes, many bacteria.  SCr 1.04, est CrCl 50 ml/min  Vanc 8/18 >> Rocephin 8/18 >>  Pt on Dilantin 250mg  qhs PTA (dose increased from 200mg  qhs on 8/17). Corrected Dilantin level in therapeutic range at 11 (alb 2.6) and does not reflect recent dose increase. Patient to receive Dilantin 500 mg IV once lines have been placed.  Goal of Therapy:  Vancomycin trough 15-20 mcg/ml Dilantin level 10-20 Eradication of infection  Plan:  - Increase Rocephin to 2g IV q24h - Increase vancomycin to 1250mg  IV q24h - F/u cultures,  fever curve, renal function, vancomycin trough at steady state if indicated - Continue Dilantin home dose 250mg  PO qhs starting 8/19 - F/u albumin, toleration of PO meds, Dilantin level in 5-7 days   Thank you for the consult.  Tomi Bamberger, PharmD Clinical Pharmacist Pager: [phone] Pharmacy: [phone] 02/09/2012 2:47 PM    Rocephin changed to Zosyn 4 hr infusion. Thank you. Okey Regal, PharmD

## 2012-02-09 NOTE — ED Provider Notes (Signed)
History     CSN: [id number]  Arrival date & time 02/09/12  4098   First MD Initiated Contact with Patient 02/09/12 [phone]      Chief Complaint  Patient presents with  . Chills    (Consider location/radiation/quality/duration/timing/severity/associated sxs/prior treatment) HPI 71 yo male presents to the ER from home with his wife after having seizure.  Pt with h/o seizure disorder dx in Dec, this is his first seizure since Dec.  Wife reports pt was lying on the floor due to back and hip pain (feels better lying on hard surface), and she heard him making a strange noise.  She reports a tonic clonic seizure.  She is unsure how long it went on.  EMS came to the house and pt was back at baseline.  Pt recently had his dilantin increased to 250 mg qhs yesterday.  Pt reports he has had fevers to 102 over the last 2 days along with cough.  Pt has had decreased appetite over the last several months.  No change in eating/drinking recently.  No urinary symptoms, no abd pain, no n/v/d, no rashes.  No sick contacts, no tick bites.    Past Medical History  Diagnosis Date  . Hypertension   . Arthritis   . Seizures     first12/12-none since  . Diabetes mellitus     type 2-no meds    Past Surgical History  Procedure Date  . Hemrhoidectomy   . Colonoscopy   . Mass excision 10/07/2011    Procedure: EXCISION MASS;  Surgeon: Louisa Second, MD;  Location: [facility];  Service: Plastics;  Laterality: Left;  excision of large mass on left back with possible lipo assistence  . Liposuction 10/07/2011    Procedure: LIPOSUCTION;  Surgeon: Louisa Second, MD;  Location: [facility];  Service: Plastics;  Laterality: Left;    History reviewed. No pertinent family history.  History  Substance Use Topics  . Smoking status: Former Smoker    Quit date: 10/03/1966  . Smokeless tobacco: Never Used   Comment: quit smoking 1971  . Alcohol Use: 12.6 oz/week    21 Shots of liquor  per week     occ      Review of Systems  All other systems reviewed and are negative.    Allergies  Review of patient's allergies indicates no known allergies.  Home Medications   Current Outpatient Rx  Name Route Sig Dispense Refill  . ASPIRIN 81 MG PO CHEW Oral Chew 81 mg by mouth daily.    Marland Kitchen VITAMIN B 12 PO Oral Take 1 tablet by mouth daily.      . OMEGA-3 FATTY ACIDS 1000 MG PO CAPS Oral Take 2 g by mouth daily.      . IBUPROFEN 200 MG PO TABS Oral Take 200 mg by mouth every 6 (six) hours as needed. For pain    . METOPROLOL TARTRATE 25 MG PO TABS Oral Take 25 mg by mouth daily.    . ADULT MULTIVITAMIN W/MINERALS CH Oral Take 1 tablet by mouth daily.    Marland Kitchen PHENYTOIN SODIUM EXTENDED 100 MG PO CAPS Oral Take 200 mg by mouth at bedtime. Total dose =250mg     . PHENYTOIN 50 MG PO CHEW Oral Chew 50 mg by mouth at bedtime. Takes with 2 100mg  capsules    . TESTOSTERONE 50 MG/5GM TD GEL Transdermal Place 5 g onto the skin daily.      BP 127/83  Pulse 66  Temp 98.9 F (37.2 C) (Oral)  Resp 18  SpO2 96%  Physical Exam  Nursing note and vitals reviewed. Constitutional: He is oriented to person, place, and time. He appears well-developed and well-nourished.  HENT:  Head: Normocephalic and atraumatic.  Right Ear: External ear normal.  Left Ear: External ear normal.  Nose: Nose normal.       Dry mucous membranes  Eyes: Conjunctivae and EOM are normal. Pupils are equal, round, and reactive to light.  Neck: Normal range of motion. Neck supple. No JVD present. No tracheal deviation present. No thyromegaly present.  Cardiovascular: Normal rate, regular rhythm, normal heart sounds and intact distal pulses.  Exam reveals no gallop and no friction rub.   No murmur heard. Pulmonary/Chest: Effort normal and breath sounds normal. No stridor. No respiratory distress. He has no wheezes. He has no rales. He exhibits no tenderness.  Abdominal: Soft. Bowel sounds are normal. He exhibits no  distension and no mass. There is no tenderness. There is no rebound and no guarding.  Musculoskeletal: Normal range of motion. He exhibits no edema and no tenderness.  Lymphadenopathy:    He has no cervical adenopathy.  Neurological: He is alert and oriented to person, place, and time. He has normal reflexes. No cranial nerve deficit. He exhibits normal muscle tone. Coordination normal.  Skin: Skin is warm and dry. No rash noted. No erythema. No pallor.  Psychiatric: He has a normal mood and affect. His behavior is normal. Judgment and thought content normal.    ED Course  Procedures (including critical care time)  Labs Reviewed  GLUCOSE, CAPILLARY - Abnormal; Notable for the following:    Glucose-Capillary 266 (*)     All other components within normal limits  BASIC METABOLIC PANEL - Abnormal; Notable for the following:    Sodium 130 (*)     Chloride 89 (*)     Glucose, Bld 270 (*)     GFR calc non Af Amer 89 (*)     All other components within normal limits  CBC WITH DIFFERENTIAL - Abnormal; Notable for the following:    WBC 14.9 (*)     RBC 3.96 (*)     HCT 36.5 (*)     RDW 17.1 (*)     Platelets 148 (*)     Neutrophils Relative 86 (*)     Neutro Abs 12.3 (*)     Lymphocytes Relative 5 (*)     Monocytes Absolute 1.4 (*)     All other components within normal limits  PHENYTOIN LEVEL, TOTAL - Abnormal; Notable for the following:    Phenytoin Lvl 6.9 (*)     All other components within normal limits  URINALYSIS, ROUTINE W REFLEX MICROSCOPIC   Dg Chest 2 View  02/09/2012  *RADIOLOGY REPORT*  Clinical Data: Fever.  Cough.  Seizures.  CHEST - 2 VIEW  Comparison: 06/03/2011  Findings: The heart size and pulmonary vascularity are normal. The lungs appear clear and expanded without focal air space disease or consolidation. No blunting of the costophrenic angles.  No pneumothorax.  Mediastinal contours appear intact.  Calcification of the aorta.  Degenerative changes in the spine.  No  significant change since previous study.  IMPRESSION: No evidence of active pulmonary disease.  Original Report Authenticated By: Marlon Pel, M.D.     1. Seizure   2. Fever   3. Leukocytosis       MDM  71 yo male with breakthrough seizure on dilantin.  Pt with reported fevers.  Will check cxr, labs, give tylenol and ns bolus.   No source of fever found yet, urine pending.  Pt care passed to Dr Karma Ganja.       Olivia Mackie, MD 02/09/12 [phone]

## 2012-02-09 NOTE — Progress Notes (Signed)
Name: Elpidio Thielen MRN: [id number] DOB: 08-Jul-1940  ELECTRONIC ICU PHYSICIAN NOTE  Problem:  C. Diff PCR positive result reported  Intervention:  C. Diff protocol started with Vancomycin PO / Flagyl IV.  Orlean Bradford, M.D. Pulmonary and Critical Care Medicine [facility] Cell: [phone] Pager: [phone]  02/09/2012, 6:05 PM

## 2012-02-09 NOTE — Consult Note (Signed)
Referring Physician: Kendrick Fries    Chief Complaint: Seizure  HPI: Joseph Copeland is an 71 y.o. male with hypertension, known seizures (first in December 2012, followed by Dr. Anne Hahn) and diabetes.  For 1 week, the patient has been feeling poorly with weakness, decreased appetite and diarrhea.  Developed fever 102 and shaking chills yesterday.  This am, wife found pt on floor having a witnessed tonic- clonic seizure.  This had stopped by the time EMS arrived and pt was transported to the ER. In ED, patient was noted to have fever of 102.9, tachycardiac, WBC 14.9, sodium 130, UTI, and Dilantin level was low at 6.9.   CXR negative for acute process.  Currently patient is on pressors for his hypotension.    According to the patient, in May,  his primary MD reduced his dilantin to 200 mg QHS from 300 mg as the patient had been seizure free since December.  At routine Ov 02/06/12, Dr Anne Hahn increased Dilantin to 250mg  qhs.  The patient reports medicine compliance.  Neurology consult requested.   Past Medical History  Diagnosis Date  . Hypertension   . Arthritis   . Seizures     first12/12-none since  . Diabetes mellitus     type 2-no meds    Past Surgical History  Procedure Date  . Hemrhoidectomy   . Colonoscopy   . Mass excision 10/07/2011    Procedure: EXCISION MASS;  Surgeon: Louisa Second, MD;  Location: [facility];  Service: Plastics;  Laterality: Left;  excision of large mass on left back with possible lipo assistence  . Liposuction 10/07/2011    Procedure: LIPOSUCTION;  Surgeon: Louisa Second, MD;  Location: [facility];  Service: Plastics;  Laterality: Left;    History reviewed. No pertinent family history. Social History: Quit smoking about 45 years ago. He has never used smokeless tobacco. He drinks about 12.6 ounces of alcohol per week. He reports that he does not use illicit drugs.  Allergies: No Known Allergies  Medications: Scheduled:   .  acetaminophen  650 mg Oral Once  . aspirin  81 mg Oral Daily  . cefTRIAXone (ROCEPHIN)  IV  1 g Intravenous Once  . famotidine (PEPCID) IV  20 mg Intravenous Q24H  . hydrocortisone sodium succinate  50 mg Intravenous Q6H  . ibuprofen  600 mg Oral Once  . insulin aspart  0-4 Units Subcutaneous Q4H  . multivitamin with minerals  1 tablet Oral Daily  . phenytoin (DILANTIN) IV  500 mg Intravenous Once  . sodium chloride  1,000 mL Intravenous Once  . sodium chloride  1,000 mL Intravenous Once  . sodium chloride  1,000 mL Intravenous Once  . sodium chloride  1,000 mL Intravenous Once  . sodium chloride  25 mL/kg Intravenous Once  . sodium chloride  500 mL Intravenous Once  . DISCONTD: cefTRIAXone (ROCEPHIN)  IV  1 g Intravenous Q24H  . DISCONTD: cefTRIAXone (ROCEPHIN)  IV  1 g Intravenous Q24H  . DISCONTD: enoxaparin (LOVENOX) injection  40 mg Subcutaneous Q24H  . DISCONTD: ibuprofen  200 mg Oral Once  . DISCONTD: insulin aspart  0-5 Units Subcutaneous QHS  . DISCONTD: insulin aspart  0-9 Units Subcutaneous TID WC  . DISCONTD: metoprolol tartrate  25 mg Oral Daily  . DISCONTD: phenytoin (DILANTIN) IV  1,000 mg Intravenous Once  . DISCONTD: vancomycin  1,000 mg Intravenous Q24H   ROS:  +fever, chills, n/v/d x 2-3 days.  Decreased appetite, generalized weakness and malaise x  1 week.  Mild productive cough.  Denies shortness of breath or chest pain.  No joint or back pain. No headache or dizziness.  No extremity weakness or paresthesia.  Physical Examination: Blood pressure 91/64, pulse 119, temperature 99.8 F (37.7 C), temperature source Oral, resp. rate 36, height 5' 1.81" (1.57 m), weight 61.2 kg (134 lb 14.7 oz), SpO2 98.00%. Telemetry: SR  Neurologic Examination: Mental Status: Alert, oriented, thought content appropriate.  Speech fluent without evidence of aphasia. Able to follow 3 step commands without difficulty. Cranial Nerves: II- Visual fields grossly  intact. III/IV/VI-Extraocular movements intact.  Pupils reactive bilaterally. V/VII-Smile symmetric; at rest, slight droop left upper lip. VIII-grossly intact IX/X-normal uvula elevation XI-bilateral shoulder shrug intact XII-midline tongue extension Motor: 5/5 bilaterally with normal tone and bulk Sensory: light touch intact throughout, bilaterally Deep Tendon Reflexes: diminished throughout Plantars: Downgoing bilaterally Cerebellar: Normal finger-to-nose, normal rapid alternating movements.  Physical Exam: HEENT: NCAT Oropharynx- small ecchymotic area right buccal mucosa anteriorly Neck: supple Lungs: CTA anteriorly, no increased WOB Cor: tachy Skin: diaphoretic   Basic Metabolic Panel:  Lab 02/09/12 2952 02/09/12 0606  NA -- 130*  K -- 4.2  CL -- 89*  CO2 -- 25  GLUCOSE -- 270*  BUN -- 17  CREATININE 1.04 0.78  CALCIUM -- 9.8  MG -- --  PHOS -- --   Liver Function Tests:  Lab 02/09/12 1038  AST 49*  ALT 34  ALKPHOS 409*  BILITOT 1.0  PROT 6.0  ALBUMIN 2.6*   CBC:  Lab 02/09/12 1242 02/09/12 0606  WBC 9.0 14.9*  NEUTROABS -- 12.3*  HGB 11.4* 13.0  HCT 33.0* 36.5*  MCV 93.8 92.2  PLT 86* 148*   Cardiac Enzymes:  Lab 02/09/12 1241 02/09/12 1053  CKTOTAL 430* 443*  CKMB 5.4* 4.6*  CKMBINDEX -- --  TROPONINI <0.30 <0.30   CBG:  Lab 02/09/12 1401 02/09/12 1251 02/09/12 1244 02/09/12 0556  GLUCAP 202* 216* 205* 266*   Urinalysis:  Lab 02/09/12 0852  COLORURINE YELLOW  LABSPEC 1.011  PHURINE 7.0  GLUCOSEU 100*  HGBUR LARGE*  BILIRUBINUR NEGATIVE  KETONESUR NEGATIVE  PROTEINUR 100*  UROBILINOGEN 0.2  NITRITE POSITIVE*  LEUKOCYTESUR LARGE*   Procalcitonin 46.53 (H) Lactic acid 4.1 (H) Dilantin Level 6.9 (L)  02/09/2012  CHEST - 2 VIEW   Findings: The heart size and pulmonary vascularity are normal. The lungs appear clear and expanded without focal air space disease or consolidation. No blunting of the costophrenic angles.  No  pneumothorax.  Mediastinal contours appear intact.  Calcification of the aorta.  Degenerative changes in the spine.  No significant change since previous study.  IMPRESSION: No evidence of active pulmonary disease.Marlon Pel, M.D.   02/09/2012   ABDOMEN - 2 VIEW Findings: Right-sided decubitus view demonstrates no free intraperitoneal air or significant air fluid levels.  Supine view demonstrates gas within normal caliber large and small bowel loops.  Distal gas.  No pneumatosis.  Phleboliths in the pelvis. No abnormal abdominal calcifications.   No appendicolith.  IMPRESSION: No acute findings.   Consuello Bossier, M.D.    Assessment: 71 y.o. male with prior seizures in setting of sepsis treated with maintenance dilantin therapy and seizure free since 12/12. Now presents with seizure in setting of urosepsis and corrected dilantin level of 11(per pharmacy calculation) On exam, no seizure activity and not post-ictal.  + buccal mucosa bite on right.     Plan: 1. IV dilantin 500 mg bolus x 1 2. Dilantin  300 mg QHS, since he has had breakthrough seizure on therapeutic treatment (200mg ). 3. Dilantin level in am.  Discussed with Dr. Eilleen Kempf.  Marya Fossa PA-C [facility] [phone] 02/09/2012, 2:21 PM  Patient seen independently and evaluated and examined. Agree with the plan and exam by. Tara Jernejcic- PA-C Patient has provoked seizure secondary to sepsis. He mentions that he has enlarged prostate and that has caused him to have recurrent UTI. His dilantin level corrected with albumin was 12 this morning. Patient seen and examined Awake, alert and follows commands. No focal neurological deficits, Provides history in great details and is accurate  Recommendations: 1) 300 mg of Dilantin QHS from tomorrow 2) Check Dilantin level tomorrow 3) Treat underlying etiology.  4) Advised  Patient he can no longer drive for 6 months, He cannot swim unsupervised, he can no longer operate  heavy machine, or be around heavy machines, not to be around stove unsupervised, cannot baby sit  And patient agreed....   Shunte Senseney V-P Eilleen Kempf., MD., Ph.D.,MS 02/09/2012 3:20 PM

## 2012-02-09 NOTE — ED Notes (Addendum)
Pt's wife upset and insisting pt is having a seizure.  Pt able to verbalize his name and DOB to this RN.  Pt's wife frustrated that nothing is currently being done for her husband.  Notified Tatyana, PA about wife's concern that pt is having a seizure and advised her that pt is talking and is A & O x 4.

## 2012-02-09 NOTE — ED Provider Notes (Signed)
Pt signed out to me by Dr. Norlene Campbell at change of shift.  Pt continues to be tachycardic, urinalysis shows UTI.  HR has increased to 153 with fever spike of 102.9.  Pt given antipyretics, has been receiving IV hydration, 2nd fluid bolus ordered.  I have ordered rocephin, cultures.  Pt admitted to [facility] service for further management.  Repeat EKG obtained to ensure sinus tachycardia rather than afib.   Date: 02/09/2012  Rate: 143  Rhythm: sinus tachycardia  QRS Axis: normal  Intervals: normal  ST/T Wave abnormalities: nonspecific T wave changes  Conduction Disutrbances:none  Narrative Interpretation:   Old EKG Reviewed: no significant changes from EKG earlier today- except rate faster    Ethelda Chick, MD 02/09/12 1536

## 2012-02-09 NOTE — Progress Notes (Signed)
CRITICAL VALUE ALERT  Critical value received:  CK-MB - 6.1  Date of notification:  02/09/2012  Time of notification:  1845  Critical value read back:yes  Nurse who received alert:  Verlon Au, RN  MD notified (1st page):  Dr. Marin Shutter  Time of first page:  19:18  MD notified (2nd page):  Time of second page:  Responding MD:  Dr. Marin Shutter  Time MD responded:  19:18

## 2012-02-09 NOTE — Progress Notes (Signed)
Pt tx to 3300 at 1224 from ED, then tx to 2100 at 1348. See documentation.

## 2012-02-09 NOTE — Progress Notes (Addendum)
ANTIBIOTIC CONSULT NOTE - INITIAL  Pharmacy Consult for Vancomycin Indication: UTI, empiric coverage  No Known Allergies  Patient Measurements: Weight = 61 kg   Labs:  Basename 02/09/12 0606  WBC 14.9*  HGB 13.0  PLT 148*  LABCREA --  CREATININE 0.78   The CrCl is unknown because both a height and weight (above a minimum accepted value) are required for this calculation. No results found for this basename: VANCOTROUGH:2,VANCOPEAK:2,VANCORANDOM:2,GENTTROUGH:2,GENTPEAK:2,GENTRANDOM:2,TOBRATROUGH:2,TOBRAPEAK:2,TOBRARND:2,AMIKACINPEAK:2,AMIKACINTROU:2,AMIKACIN:2, in the last 72 hours   Microbiology: No results found for this or any previous visit (from the past 720 hour(s)).  Medical History: Past Medical History  Diagnosis Date  . Hypertension   . Arthritis   . Seizures     first12/12-none since  . Diabetes mellitus     type 2-no meds    Assessment: 71 year old male admitted with fever, chills, and new seizure. Scr=0.78 Also started on Rocephin  Goal of Therapy:  Vancomycin trough level 10-15 mcg/ml  Plan:  1) Vancomycin 1 Gram iv Q 24 hours 2) Follow up plan, cultures, Scr  Thank you. Okey Regal, PharmD [phone]  02/09/2012,11:13 AM  PM  Added Zosyn  Plan: Zosyn 3.375 grams iv Q 8 hours - 4 hour infusion Continue to follow  Thank you Okey Regal, PharmD

## 2012-02-09 NOTE — ED Notes (Signed)
Patient brought into ED by his wife; patient complaining of chills.  Wife noticed that patient was making a "funny noise" while sleeping tonight and noticed that patient was having a seizure.  Patient reports recent seizure disorder than began in December of last year.  Patient alert and oriented x4.  Wife reports that this is the first seizure since December.

## 2012-02-09 NOTE — Procedures (Signed)
Central Venous Catheter Insertion Procedure Note Yavier Snider [id number] Sep 15, 1940  Procedure: Insertion of Central Venous Catheter Indications: Assessment of intravascular volume, Drug and/or fluid administration and Frequent blood sampling  Procedure Details Consent: Risks of procedure as well as the alternatives and risks of each were explained to the (patient/caregiver).  Consent for procedure obtained. Time Out: Verified patient identification, verified procedure, site/side was marked, verified correct patient position, special equipment/implants available, medications/allergies/relevent history reviewed, required imaging and test results available.  Performed  Maximum sterile technique was used including antiseptics, cap, gloves, gown, hand hygiene, mask and sheet. Skin prep: Chlorhexidine; local anesthetic administered A antimicrobial bonded/coated triple lumen catheter was placed in the left internal jugular vein using the Seldinger technique.  Evaluation Blood flow good Complications: No apparent complications Patient did tolerate procedure well. Chest X-ray ordered to verify placement.  CXR: pending.  Performed using ultrasound guidance, under direct MD supervision.   Danford Bad, NP  02/09/2012, 2:35 PM   Yolonda Kida PCCM Pager: [phone] Cell: [phone] If no response, call [phone]

## 2012-02-09 NOTE — ED Notes (Signed)
MD at bedside. 

## 2012-02-09 NOTE — ED Notes (Signed)
Blood cultures x2 ordered after antibiotic started.

## 2012-02-09 NOTE — H&P (Signed)
Name: Joseph Copeland MRN: [id number] DOB: April 23, 1941    LOS: 0  Referring Provider:  Isidoro Donning ([facility])  Reason for Referral:  sepsis  PULMONARY / CRITICAL CARE MEDICINE  HPI:  71yo male with hx HTN, seizure disorder, DM presented 8/18 with fevers (102.9), chills and ??seizure,  Per pts wife he has "not been himself" for 1-2 weeks with loss of appetite, diarrhea, chills and generalized weakness.  In ER, u/a was grossly positive for UTI, CXR clear and dilantin level low.  He was given dilantin, abx for UTI and admitted by [facility] to SDU.  However on admission to SDU he was noted to be significantly hypotensive with SBP 70's despite 3L fluids and PCCM consulted for septic shock and ICU tx.    Past Medical History  Diagnosis Date  . Hypertension   . Arthritis   . Seizures     first12/12-none since  . Diabetes mellitus     type 2-no meds   Past Surgical History  Procedure Date  . Hemrhoidectomy   . Colonoscopy   . Mass excision 10/07/2011    Procedure: EXCISION MASS;  Surgeon: Louisa Second, MD;  Location: [facility];  Service: Plastics;  Laterality: Left;  excision of large mass on left back with possible lipo assistence  . Liposuction 10/07/2011    Procedure: LIPOSUCTION;  Surgeon: Louisa Second, MD;  Location: [facility];  Service: Plastics;  Laterality: Left;   Prior to Admission medications   Medication Sig Start Date End Date Taking? Authorizing Provider  aspirin 81 MG chewable tablet Chew 81 mg by mouth daily.   Yes Historical Provider, MD  Cyanocobalamin (VITAMIN B 12 PO) Take 1 tablet by mouth daily.     Yes Historical Provider, MD  fish oil-omega-3 fatty acids 1000 MG capsule Take 2 g by mouth daily.     Yes Historical Provider, MD  ibuprofen (ADVIL,MOTRIN) 200 MG tablet Take 200 mg by mouth every 6 (six) hours as needed. For pain   Yes Historical Provider, MD  metoprolol tartrate (LOPRESSOR) 25 MG tablet Take 25 mg by mouth daily.   Yes Historical  Provider, MD  Multiple Vitamin (MULTIVITAMIN WITH MINERALS) TABS Take 1 tablet by mouth daily.   Yes Historical Provider, MD  phenytoin (DILANTIN) 100 MG ER capsule Take 200 mg by mouth at bedtime. Total dose =250mg    Yes Historical Provider, MD  phenytoin (DILANTIN) 50 MG tablet Chew 50 mg by mouth at bedtime. Takes with 2 100mg  capsules   Yes Historical Provider, MD  testosterone (ANDROGEL) 50 MG/5GM GEL Place 5 g onto the skin daily.   Yes Historical Provider, MD   Allergies No Known Allergies  Family History History reviewed. No pertinent family history. Social History  reports that he quit smoking about 45 years ago. He has never used smokeless tobacco. He reports that he drinks about 12.6 ounces of alcohol per week. He reports that he does not use illicit drugs.  Review Of Systems:  C/o malaise, diarrhea, loss of appetite, chills.  Denies chest pain, SOB, hemoptysis, abd pain. ALl other systems reviewed and were neg.   Vital Signs: Temp:  [98.9 F (37.2 C)-102.9 F (39.4 C)] 99.8 F (37.7 C) (08/18 1210) Pulse Rate:  [66-153] 119  (08/18 1330) Resp:  [18-40] 36  (08/18 1330) BP: (72-127)/(54-83) 91/64 mmHg (08/18 1330) SpO2:  [96 %-100 %] 98 % (08/18 1330) Weight:  [134 lb 14.7 oz (61.2 kg)] 134 lb 14.7 oz (61.2 kg) (08/18 1300)  Physical Examination: General:  Acutely ill appearing male, mild distress Neuro:  Awake, alert, appropriate, MAE HEENT:  Mm dry, no JVD Cardiovascular:  s1s2 tachy Lungs:  resps even, slightly tachypneic, non labored, cta Abdomen:  Distended, reasonably soft, hypoactive bs, mildly tender diffusely Ext: cool, clammy, diaphoretic, no edema   Principal Problem:  *SIRS (systemic inflammatory response syndrome) Active Problems:  Seizures, generalized convulsive  HTN (hypertension)  DM (diabetes mellitus), type 2  UTI (lower urinary tract infection)  Abdominal distension   ASSESSMENT AND PLAN  PULMONARY No results found for this basename:  PHART:5,PCO2:5,PCO2ART:5,PO2ART:5,HCO3:5,O2SAT:5 in the last 168 hours Ventilator Settings:   CXR:  8/18>>>clear ETT:    A:  Tachypnea - r/t Sepsis.   P:   Pulm status currently stable  Monitor as remains high risk for intubation   CARDIOVASCULAR  Lab 02/09/12 1241 02/09/12 1053 02/09/12 1031  TROPONINI <0.30 <0.30 --  LATICACIDVEN -- -- 4.1*  PROBNP -- -- --   ECG:  ST Lines:   A: Septic shock  Hypotension   P:  Sepsis protocol  CVL for CVP  Levophed  Cortisol level  Stress steroids depending on cortisol level Repeat lactic acid  RENAL  Lab 02/09/12 1242 02/09/12 0606  NA -- 130*  K -- 4.2  CL -- 89*  CO2 -- 25  BUN -- 17  CREATININE 1.04 0.78  CALCIUM -- 9.8  MG -- --  PHOS -- --   Intake/Output      08/17 0701 - 08/18 0700 08/18 0701 - 08/19 0700   I.V. (mL/kg)  2000 (32.7)   Total Intake(mL/kg)  2000 (32.7)   Net  +2000         Foley:  8/18>>  A:  No acute issue P:   Monitor renal function in setting sepsis/ hypotension  Scr trending up slowly F/u chem   GASTROINTESTINAL  Lab 02/09/12 1038  AST 49*  ALT 34  ALKPHOS 409*  BILITOT 1.0  PROT 6.0  ALBUMIN 2.6*    A:  Abd distension Diarrhea  Elevated Alk Phos? P:   CDiff PCR pending  abd ultrasound pending  HEMATOLOGIC  Lab 02/09/12 1242 02/09/12 0606  HGB 11.4* 13.0  HCT 33.0* 36.5*  PLT 86* 148*  INR -- --  APTT -- --   A:   Thrombocytopenia; due to sepsis ? DIC? HUS? P:  F/u cbc  D/c lovenox  SCD's for now Peripheral smear  DIC panel  INFECTIOUS  Lab 02/09/12 1242 02/09/12 1038 02/09/12 0606  WBC 9.0 -- 14.9*  PROCALCITON -- 46.35 --   Cultures: BCx2 8/18>>> Urine 8/18>>>  Antibiotics: Rocephin 8/18>>>8/18 Vanc 8/18>>> 8/18 Zosyn>>  A:  UTI Diarrhea  Elevated alk phos and mild abdominal pain worrisome, worrisome for cholecystitis but exam benign P:   abx as above  Sepsis protocol  F/U Ab [place]   ENDOCRINE  Lab 02/09/12 1251 02/09/12 1244 02/09/12  0556  GLUCAP 216* 205* 266*   A:  DM   P:   ICU hyperglycemia protocol  May need lantus with addition steroids   NEUROLOGIC  A:  No acute issue - remains awake/ alert  P:   D/c sedating meds   BEST PRACTICE / DISPOSITION Level of Care:  ICU Primary Service:  PCCM Consultants:   Code Status:  full Diet:  npo DVT Px:  lovenox  GI Px:  pepcid  Skin Integrity:  intact Social / Family:  Updated at Fabian Sharp, NP Pulmonary and Critical Care  Medicine [employer] Pager: [phone]  02/09/2012, 1:48 PM  CC time 45 minutes.  Attending:  I have seen and examined the patient with nurse practitioner/resident and agree with the note above.   Yolonda Kida PCCM Pager: [phone] Cell: [phone] If no response, call [phone]

## 2012-02-09 NOTE — Progress Notes (Signed)
Pt tx 2100 per MD order, pt stable during tx, family at [facility], pt and family verbalized understanding of tx, report called to receiving RN, all questions answered, RN updated at [facility]

## 2012-02-10 ENCOUNTER — Inpatient Hospital Stay (HOSPITAL_COMMUNITY): Payer: Medicare Other

## 2012-02-10 LAB — CBC
HCT: 31.9 % — ABNORMAL LOW (ref 39.0–52.0)
Hemoglobin: 11.1 g/dL — ABNORMAL LOW (ref 13.0–17.0)
RDW: 17.7 % — ABNORMAL HIGH (ref 11.5–15.5)
WBC: 16.7 10*3/uL — ABNORMAL HIGH (ref 4.0–10.5)

## 2012-02-10 LAB — GLUCOSE, CAPILLARY
Glucose-Capillary: 144 mg/dL — ABNORMAL HIGH (ref 70–99)
Glucose-Capillary: 111 mg/dL — ABNORMAL HIGH (ref 70–99)
Glucose-Capillary: 96 mg/dL (ref 70–99)
Glucose-Capillary: 135 mg/dL — ABNORMAL HIGH (ref 70–99)

## 2012-02-10 LAB — TSH: TSH: 1.052 u[IU]/mL (ref 0.350–4.500)

## 2012-02-10 LAB — BASIC METABOLIC PANEL
Chloride: 103 mEq/L (ref 96–112)
GFR calc Af Amer: >90 mL/min (ref >90)
GFR calc non Af Amer: 84 mL/min — ABNORMAL LOW (ref >90)
Potassium: 3.1 mEq/L — ABNORMAL LOW (ref 3.5–5.1)

## 2012-02-10 MED ORDER — POTASSIUM CHLORIDE 10 MEQ/50ML IV SOLN
10.0000 meq | INTRAVENOUS | Status: AC
  Administered 2012-02-10 (×4): 10 meq via INTRAVENOUS
  Filled 2012-02-10 (×4): qty 50

## 2012-02-10 MED ORDER — FAMOTIDINE 20 MG PO TABS
20.0000 mg | ORAL_TABLET | Freq: Every day | ORAL | Status: DC
  Administered 2012-02-10: 20 mg via ORAL
  Filled 2012-02-10 (×2): qty 1

## 2012-02-10 NOTE — Progress Notes (Signed)
Pt transferrred via bed from 2100.  Oriented to room and call bell explained.  Family at bedside.  RN Ana to be updated and assume care for pt when back from lunch.

## 2012-02-10 NOTE — Progress Notes (Signed)
[facility] ADULT ICU REPLACEMENT PROTOCOL FOR AM LAB REPLACEMENT ONLY  The patient does apply for the [facility] Adult ICU Electrolyte Replacment Protocol based on the criteria listed below:   1. Is GFR >/= 50 ml/min? yes  Patient's GFR today is 84 2. Is urine output >/= 0.5 ml/kg/hr for the last 8 hours? yes Patient's UOP is 0.93 ml/kg/hr 3. Is BUN < 30 mg/dL? yes  Patient's BUN today is 21 4. Abnormal electrolyte(s): k 3.1  5. Ordered repletion with: per protocol 6. If a panic level lab has been reported, has the CCM MD in charge been notified? no.   Physician:    Markus Daft A 02/10/2012 4:25 AM

## 2012-02-10 NOTE — Progress Notes (Signed)
  Echocardiogram 2D Echocardiogram has been performed.  Salil Raineri FRANCES 02/10/2012, 12:03 PM

## 2012-02-10 NOTE — Progress Notes (Signed)
Subjective: Patient with recurrent urosepsis may be secondary to enlarged prostate. He also had a similar episode in December 2012 and had a provoked seizure.  However, he was on dilantin for seizure. His PCP was tapering his dilantin dosage but had a break through seizure.  He is also uroseptic.  His corrected dilantin level on admission  was 12. Continues to receive 300 mg qhs He is awake, alert and follows commands. No confusion or disorientation has been noticed He had significant hypotension and was started on pressors ( tachycardia, fever, hypotensive)    Objective: Current vital signs: BP 119/91  Pulse 109  Temp 97.7 F (36.5 C) (Oral)  Resp 36  Ht 5' 1.81" (1.57 m)  Wt 62.3 kg (137 lb 5.6 oz)  BMI 25.27 kg/m2  SpO2 100% Vital signs in last 24 hours: Temp:  [97.4 F (36.3 C)-102.9 F (39.4 C)] 97.7 F (36.5 C) (08/19 0750) Pulse Rate:  [106-153] 109  (08/19 0700) Resp:  [18-42] 36  (08/19 0700) BP: (72-124)/(53-91) 119/91 mmHg (08/19 0700) SpO2:  [94 %-100 %] 100 % (08/19 0700) Weight:  [61.2 kg (134 lb 14.7 oz)-62.3 kg (137 lb 5.6 oz)] 62.3 kg (137 lb 5.6 oz) (08/19 0500)  Intake/Output from previous day: 08/18 0701 - 08/19 0700 In: 5162.7 [P.O.:60; I.V.:4542.7; IV Piggyback:560] Out: 550 [Urine:550] Intake/Output this shift:   Nutritional status:     Neurologic Exam: AAO follows commands No Cranial nerves deficits  Moving all the 4 extremities No weakness  Sensory intact   Lab Results: Results for orders placed during the hospital encounter of 02/09/12 (from the past 48 hour(s))  GLUCOSE, CAPILLARY     Status: Abnormal   Collection Time   02/09/12  5:56 AM      Component Value Range Comment   Glucose-Capillary 266 (*) 70 - 99 mg/dL   BASIC METABOLIC PANEL     Status: Abnormal   Collection Time   02/09/12  6:06 AM      Component Value Range Comment   Sodium 130 (*) 135 - 145 mEq/L    Potassium 4.2  3.5 - 5.1 mEq/L    Chloride 89 (*) 96 - 112 mEq/L    CO2 25  19 - 32 mEq/L    Glucose, Bld 270 (*) 70 - 99 mg/dL    BUN 17  6 - 23 mg/dL    Creatinine, Ser 1.61  0.50 - 1.35 mg/dL    Calcium 9.8  8.4 - 09.6 mg/dL    GFR calc non Af Amer 89 (*) >90 mL/min    GFR calc Af Amer >90  >90 mL/min   CBC WITH DIFFERENTIAL     Status: Abnormal   Collection Time   02/09/12  6:06 AM      Component Value Range Comment   WBC 14.9 (*) 4.0 - 10.5 K/uL    RBC 3.96 (*) 4.22 - 5.81 MIL/uL    Hemoglobin 13.0  13.0 - 17.0 g/dL    HCT 04.5 (*) 40.9 - 52.0 %    MCV 92.2  78.0 - 100.0 fL    MCH 32.8  26.0 - 34.0 pg    MCHC 35.6  30.0 - 36.0 g/dL    RDW 81.1 (*) 91.4 - 15.5 %    Platelets 148 (*) 150 - 400 K/uL    Neutrophils Relative 86 (*) 43 - 77 %    Neutro Abs 12.3 (*) 1.7 - 7.7 K/uL    Lymphocytes Relative 5 (*) 12 - 46 %  Lymphs Abs 0.7  0.7 - 4.0 K/uL    Monocytes Relative 10  3 - 12 %    Monocytes Absolute 1.4 (*) 0.1 - 1.0 K/uL    Eosinophils Relative 0  0 - 5 %    Eosinophils Absolute 0.0  0.0 - 0.7 K/uL    Basophils Relative 0  0 - 1 %    Basophils Absolute 0.0  0.0 - 0.1 K/uL   PHENYTOIN LEVEL, TOTAL     Status: Abnormal   Collection Time   02/09/12  6:06 AM      Component Value Range Comment   Phenytoin Lvl 6.9 (*) 10.0 - 20.0 ug/mL   URINALYSIS, ROUTINE W REFLEX MICROSCOPIC     Status: Abnormal   Collection Time   02/09/12  8:52 AM      Component Value Range Comment   Color, Urine YELLOW  YELLOW    APPearance TURBID (*) CLEAR    Specific Gravity, Urine 1.011  1.005 - 1.030    pH 7.0  5.0 - 8.0    Glucose, UA 100 (*) NEGATIVE mg/dL    Hgb urine dipstick LARGE (*) NEGATIVE    Bilirubin Urine NEGATIVE  NEGATIVE    Ketones, ur NEGATIVE  NEGATIVE mg/dL    Protein, ur 409 (*) NEGATIVE mg/dL    Urobilinogen, UA 0.2  0.0 - 1.0 mg/dL    Nitrite POSITIVE (*) NEGATIVE    Leukocytes, UA LARGE (*) NEGATIVE   URINE MICROSCOPIC-ADD ON     Status: Abnormal   Collection Time   02/09/12  8:52 AM      Component Value Range Comment   Squamous  Epithelial / LPF RARE  RARE    WBC, UA TOO NUMEROUS TO COUNT  <3 WBC/hpf    RBC / HPF 21-50  <3 RBC/hpf    Bacteria, UA MANY (*) RARE   LACTIC ACID, PLASMA     Status: Abnormal   Collection Time   02/09/12 10:31 AM      Component Value Range Comment   Lactic Acid, Venous 4.1 (*) 0.5 - 2.2 mmol/L   PROCALCITONIN     Status: Normal   Collection Time   02/09/12 10:38 AM      Component Value Range Comment   Procalcitonin 46.35     HEPATIC FUNCTION PANEL     Status: Abnormal   Collection Time   02/09/12 10:38 AM      Component Value Range Comment   Total Protein 6.0  6.0 - 8.3 g/dL    Albumin 2.6 (*) 3.5 - 5.2 g/dL    AST 49 (*) 0 - 37 U/L    ALT 34  0 - 53 U/L    Alkaline Phosphatase 409 (*) 39 - 117 U/L    Total Bilirubin 1.0  0.3 - 1.2 mg/dL    Bilirubin, Direct 0.6 (*) 0.0 - 0.3 mg/dL    Indirect Bilirubin 0.4  0.3 - 0.9 mg/dL   TROPONIN I     Status: Normal   Collection Time   02/09/12 10:53 AM      Component Value Range Comment   Troponin I <0.30  <0.30 ng/mL   CK TOTAL AND CKMB     Status: Abnormal   Collection Time   02/09/12 10:53 AM      Component Value Range Comment   Total CK 443 (*) 7 - 232 U/L    CK, MB 4.6 (*) 0.3 - 4.0 ng/mL    Relative Index 1.0  0.0 - 2.5   CARDIAC PANEL(CRET KIN+CKTOT+MB+TROPI)     Status: Abnormal   Collection Time   02/09/12 12:41 PM      Component Value Range Comment   Total CK 430 (*) 7 - 232 U/L    CK, MB 5.4 (*) 0.3 - 4.0 ng/mL    Troponin I <0.30  <0.30 ng/mL    Relative Index 1.3  0.0 - 2.5   HEMOGLOBIN A1C     Status: Abnormal   Collection Time   02/09/12 12:42 PM      Component Value Range Comment   Hemoglobin A1C 6.2 (*) <5.7 %    Mean Plasma Glucose 131 (*) <117 mg/dL   CBC     Status: Abnormal   Collection Time   02/09/12 12:42 PM      Component Value Range Comment   WBC 9.0  4.0 - 10.5 K/uL    RBC 3.52 (*) 4.22 - 5.81 MIL/uL    Hemoglobin 11.4 (*) 13.0 - 17.0 g/dL    HCT 16.1 (*) 09.6 - 52.0 %    MCV 93.8  78.0 - 100.0  fL    MCH 32.4  26.0 - 34.0 pg    MCHC 34.5  30.0 - 36.0 g/dL    RDW 04.5 (*) 40.9 - 15.5 %    Platelets 86 (*) 150 - 400 K/uL   CREATININE, SERUM     Status: Abnormal   Collection Time   02/09/12 12:42 PM      Component Value Range Comment   Creatinine, Ser 1.04  0.50 - 1.35 mg/dL    GFR calc non Af Amer 71 (*) >90 mL/min    GFR calc Af Amer 82 (*) >90 mL/min   GLUCOSE, CAPILLARY     Status: Abnormal   Collection Time   02/09/12 12:44 PM      Component Value Range Comment   Glucose-Capillary 205 (*) 70 - 99 mg/dL    Comment 1 Notify RN     MRSA PCR SCREENING     Status: Normal   Collection Time   02/09/12 12:49 PM      Component Value Range Comment   MRSA by PCR NEGATIVE  NEGATIVE   GLUCOSE, CAPILLARY     Status: Abnormal   Collection Time   02/09/12 12:51 PM      Component Value Range Comment   Glucose-Capillary 216 (*) 70 - 99 mg/dL    Comment 1 Notify RN      Comment 2 Documented in Chart     GLUCOSE, CAPILLARY     Status: Abnormal   Collection Time   02/09/12  2:01 PM      Component Value Range Comment   Glucose-Capillary 202 (*) 70 - 99 mg/dL   CARBOXYHEMOGLOBIN     Status: Abnormal   Collection Time   02/09/12  3:00 PM      Component Value Range Comment   Total hemoglobin 11.4 (*) 13.5 - 18.0 g/dL    O2 Saturation 81.1      Carboxyhemoglobin 1.2  0.5 - 1.5 %    Methemoglobin 1.0  0.0 - 1.5 %   CLOSTRIDIUM DIFFICILE BY PCR     Status: Abnormal   Collection Time   02/09/12  3:18 PM      Component Value Range Comment   C difficile by pcr POSITIVE (*) NEGATIVE   CORTISOL     Status: Normal   Collection Time   02/09/12  3:30 PM  Component Value Range Comment   Cortisol, Plasma 70.8     TYPE AND SCREEN     Status: Normal   Collection Time   02/09/12  3:30 PM      Component Value Range Comment   ABO/RH(D) O POS      Antibody Screen NEG      Sample Expiration 02/12/2012     TECHNOLOGIST SMEAR REVIEW     Status: Normal   Collection Time   02/09/12  3:30 PM       Component Value Range Comment   Tech Review INCREASED BANDS (>20% BANDS)     DIC (DISSEMINATED INTRAVASCULAR COAGULATION) PANEL     Status: Abnormal   Collection Time   02/09/12  3:30 PM      Component Value Range Comment   Prothrombin Time 16.8 (*) 11.6 - 15.2 seconds    INR 1.34  0.00 - 1.49    aPTT 37  24 - 37 seconds    Fibrinogen 758 (*) 204 - 475 mg/dL    D-Dimer, Quant 2.13 (*) 0.00 - 0.48 ug/mL-FEU    Platelets 78 (*) 150 - 400 K/uL    Smear Review NO SCHISTOCYTES SEEN     ABO/RH     Status: Normal   Collection Time   02/09/12  3:30 PM      Component Value Range Comment   ABO/RH(D) O POS     CARDIAC PANEL(CRET KIN+CKTOT+MB+TROPI)     Status: Abnormal   Collection Time   02/09/12  5:28 PM      Component Value Range Comment   Total CK 343 (*) 7 - 232 U/L    CK, MB 6.1 (*) 0.3 - 4.0 ng/mL    Troponin I <0.30  <0.30 ng/mL    Relative Index 1.8  0.0 - 2.5   CARBOXYHEMOGLOBIN     Status: Abnormal   Collection Time   02/09/12  5:30 PM      Component Value Range Comment   Total hemoglobin 11.3 (*) 13.5 - 18.0 g/dL    O2 Saturation 08.6      Carboxyhemoglobin 1.4  0.5 - 1.5 %    Methemoglobin 1.0  0.0 - 1.5 %   LACTIC ACID, PLASMA     Status: Abnormal   Collection Time   02/09/12  8:00 PM      Component Value Range Comment   Lactic Acid, Venous 4.9 (*) 0.5 - 2.2 mmol/L   CARBOXYHEMOGLOBIN     Status: Abnormal   Collection Time   02/09/12  8:00 PM      Component Value Range Comment   Total hemoglobin 10.9 (*) 13.5 - 18.0 g/dL    O2 Saturation 57.8      Carboxyhemoglobin 1.0  0.5 - 1.5 %    Methemoglobin 1.2  0.0 - 1.5 %   GLUCOSE, CAPILLARY     Status: Abnormal   Collection Time   02/09/12  8:00 PM      Component Value Range Comment   Glucose-Capillary 242 (*) 70 - 99 mg/dL   CARBOXYHEMOGLOBIN     Status: Abnormal   Collection Time   02/09/12  9:00 PM      Component Value Range Comment   Total hemoglobin 10.4 (*) 13.5 - 18.0 g/dL    O2 Saturation 46.9       Carboxyhemoglobin 1.2  0.5 - 1.5 %    Methemoglobin 1.0  0.0 - 1.5 %   CARBOXYHEMOGLOBIN     Status: Abnormal  Collection Time   02/09/12 10:15 PM      Component Value Range Comment   Total hemoglobin 11.3 (*) 13.5 - 18.0 g/dL    O2 Saturation 16.1      Carboxyhemoglobin 1.0  0.5 - 1.5 %    Methemoglobin 1.1  0.0 - 1.5 %   GLUCOSE, CAPILLARY     Status: Abnormal   Collection Time   02/09/12 11:32 PM      Component Value Range Comment   Glucose-Capillary 188 (*) 70 - 99 mg/dL   CARDIAC PANEL(CRET KIN+CKTOT+MB+TROPI)     Status: Abnormal   Collection Time   02/10/12  2:09 AM      Component Value Range Comment   Total CK 306 (*) 7 - 232 U/L    CK, MB 10.2 (*) 0.3 - 4.0 ng/mL CRITICAL VALUE NOTED.  VALUE IS CONSISTENT WITH PREVIOUSLY REPORTED AND CALLED VALUE.   Troponin I <0.30  <0.30 ng/mL    Relative Index 3.3 (*) 0.0 - 2.5   BASIC METABOLIC PANEL     Status: Abnormal   Collection Time   02/10/12  2:09 AM      Component Value Range Comment   Sodium 137  135 - 145 mEq/L    Potassium 3.1 (*) 3.5 - 5.1 mEq/L    Chloride 103  96 - 112 mEq/L    CO2 17 (*) 19 - 32 mEq/L    Glucose, Bld 169 (*) 70 - 99 mg/dL    BUN 21  6 - 23 mg/dL    Creatinine, Ser 0.96  0.50 - 1.35 mg/dL    Calcium 7.8 (*) 8.4 - 10.5 mg/dL    GFR calc non Af Amer 84 (*) >90 mL/min    GFR calc Af Amer >90  >90 mL/min   CBC     Status: Abnormal   Collection Time   02/10/12  2:09 AM      Component Value Range Comment   WBC 16.7 (*) 4.0 - 10.5 K/uL    RBC 3.40 (*) 4.22 - 5.81 MIL/uL    Hemoglobin 11.1 (*) 13.0 - 17.0 g/dL    HCT 04.5 (*) 40.9 - 52.0 %    MCV 93.8  78.0 - 100.0 fL    MCH 32.6  26.0 - 34.0 pg    MCHC 34.8  30.0 - 36.0 g/dL    RDW 81.1 (*) 91.4 - 15.5 %    Platelets 86 (*) 150 - 400 K/uL CONSISTENT WITH PREVIOUS RESULT  GLUCOSE, CAPILLARY     Status: Abnormal   Collection Time   02/10/12  4:10 AM      Component Value Range Comment   Glucose-Capillary 144 (*) 70 - 99 mg/dL     Recent Results  (from the past 240 hour(s))  MRSA PCR SCREENING     Status: Normal   Collection Time   02/09/12 12:49 PM      Component Value Range Status Comment   MRSA by PCR NEGATIVE  NEGATIVE Final   CLOSTRIDIUM DIFFICILE BY PCR     Status: Abnormal   Collection Time   02/09/12  3:18 PM      Component Value Range Status Comment   C difficile by pcr POSITIVE (*) NEGATIVE Final     Lipid Panel No results found for this basename: CHOL,TRIG,HDL,CHOLHDL,VLDL,LDLCALC in the last 72 hours  Studies/Results: Dg Chest 2 View  02/09/2012  *RADIOLOGY REPORT*  Clinical Data: Fever.  Cough.  Seizures.  CHEST - 2 VIEW  Comparison: 06/03/2011  Findings: The heart size and pulmonary vascularity are normal. The lungs appear clear and expanded without focal air space disease or consolidation. No blunting of the costophrenic angles.  No pneumothorax.  Mediastinal contours appear intact.  Calcification of the aorta.  Degenerative changes in the spine.  No significant change since previous study.  IMPRESSION: No evidence of active pulmonary disease.  Original Report Authenticated By: Marlon Pel, M.D.   Dg Chest Portable 1 View  02/09/2012  *RADIOLOGY REPORT*  Clinical Data: Sepsis.  Central line placement.  PORTABLE CHEST - 1 VIEW  Comparison: 6:47 hours  Findings: Left internal jugular vein center venous catheter placed. Tip is in the upper SVC.  No pneumothorax.  Normal heart size. Clear lungs.  IMPRESSION: Left internal jugular vein central venous catheter placement with its tip in the upper SVC and no pneumothorax.  Original Report Authenticated By: Donavan Burnet, M.D.   Dg Abd 2 Views  02/09/2012  *RADIOLOGY REPORT*  Clinical Data: Abdominal distention for 4 months.  Diarrhea for 2 months.  Fever for 1 week.  Cough and congestion.  ABDOMEN - 2 VIEW  Comparison: Chest film earlier today.  Abdominal pelvic CT of 01/05/2009  Findings: Right-sided decubitus view demonstrates no free intraperitoneal air or significant  air fluid levels.  Supine view demonstrates gas within normal caliber large and small bowel loops.  Distal gas.  No pneumatosis.  Phleboliths in the pelvis. No abnormal abdominal calcifications.   No appendicolith.  IMPRESSION: No acute findings.  Original Report Authenticated By: Consuello Bossier, M.D.    Medications:  Scheduled:   . antiseptic oral rinse  15 mL Mouth Rinse q12n4p  . aspirin  81 mg Oral Daily  . cefTRIAXone (ROCEPHIN)  IV  1 g Intravenous Once  . chlorhexidine  15 mL Mouth Rinse BID  . famotidine (PEPCID) IV  20 mg Intravenous Q24H  . hydrocortisone sodium succinate  50 mg Intravenous Q6H  . ibuprofen  600 mg Oral Once  . insulin aspart  0-4 Units Subcutaneous Q4H  . vancomycin  500 mg Oral Q6H   And  . metronidazole  500 mg Intravenous Q8H  . multivitamin with minerals  1 tablet Oral Daily  . phenytoin (DILANTIN) IV  500 mg Intravenous Once  . phenytoin  300 mg Oral QHS  . piperacillin-tazobactam (ZOSYN)  IV  3.375 g Intravenous Q8H  . potassium chloride  10 mEq Intravenous Q1 Hr x 4  . sodium chloride  1,000 mL Intravenous Once  . sodium chloride  1,000 mL Intravenous Once  . sodium chloride  1,000 mL Intravenous Once  . sodium chloride  25 mL/kg Intravenous Once  . sodium chloride  500 mL Intravenous Once  . vancomycin  1,250 mg Intravenous Q24H  . white petrolatum      . DISCONTD: cefTRIAXone (ROCEPHIN)  IV  1 g Intravenous Q24H  . DISCONTD: cefTRIAXone (ROCEPHIN)  IV  1 g Intravenous Q24H  . DISCONTD: cefTRIAXone (ROCEPHIN)  IV  2 g Intravenous Q24H  . DISCONTD: enoxaparin (LOVENOX) injection  40 mg Subcutaneous Q24H  . DISCONTD: ibuprofen  200 mg Oral Once  . DISCONTD: insulin aspart  0-5 Units Subcutaneous QHS  . DISCONTD: insulin aspart  0-9 Units Subcutaneous TID WC  . DISCONTD: metoprolol tartrate  25 mg Oral Daily  . DISCONTD: phenytoin (DILANTIN) IV  1,000 mg Intravenous Once  . DISCONTD: phenytoin  200 mg Oral QHS  . DISCONTD: phenytoin  300 mg Oral  QHS  . DISCONTD: phenytoin  50 mg Oral QHS  . DISCONTD: vancomycin  1,000 mg Intravenous Q24H     Patient with recurrent urosepsis. And history of seizure disorder (?)  Not sure if he was on dilantin for previous provoked seizure or not. Patient unable to provide details. He has an outpatient neurologist that is monitoring dilantin.   Recommendations: 1) 300 mg of Dilantin qhs 2) Check total dilantin level today. 3) Reinforced patient not to drive, operate heavy machinery and or baby sit, or swim unsupervised Joseph Copeland V-P Eilleen Kempf., MD., Ph.D.,MS 02/10/2012 8:24 AM

## 2012-02-10 NOTE — Evaluation (Signed)
Physical Therapy Evaluation Patient Details Name: Joseph Copeland MRN: [id number] DOB: April 26, 1941 Today's Date: 02/10/2012 Time: 4098-1191 PT Time Calculation (min): 22 min  PT Assessment / Plan / Recommendation Clinical Impression  Pt admitted with sz, UTI and sepsis. Pt progressing with mobility but very unsteady on his feet with shuffling gait and difficulty with problem solving. Pt will benefit from acute therapy to maximize mobility, gait, transfers and balance prior to discharge to decrease burden of care.     PT Assessment  Patient needs continued PT services    Follow Up Recommendations  Home health PT;Supervision/Assistance - 24 hour;Inpatient Rehab (pending rate of progression)    Barriers to Discharge None      Equipment Recommendations  Rolling walker with 5" wheels    Recommendations for Other Services OT consult   Frequency Min 3X/week    Precautions / Restrictions Precautions Precautions: Fall   Pertinent Vitals/Pain No pain, pt did report some dizziness with standing but unable to rate     Mobility  Bed Mobility Bed Mobility: Supine to Sit Supine to Sit: 4: Min assist;HOB elevated;With rails (HOB 30degrees) Details for Bed Mobility Assistance: cueing for sequence and assist with pad to fully rotate pelvis to EOB  Transfers Transfers: Sit to Stand;Stand to Sit Sit to Stand: 4: Min assist;From bed Stand to Sit: 4: Min assist;To chair/3-in-1;With armrests Details for Transfer Assistance: cueing for sequence, hand placement and safety as pt sitting prematurely at surface Ambulation/Gait Ambulation/Gait Assistance: 3: Mod assist (+1 for lines and safety) Ambulation Distance (Feet): 18 Feet Assistive device: Rolling walker Ambulation/Gait Assistance Details: Pt pushing RW too far anteiorly despite assist and max cueing to reposition RW, pt maintaining shuffled gait with posterior lean. Pt able to state that he was aware his gait was different but could not  correct Gait Pattern: Shuffle;Narrow base of support;Decreased trunk rotation Gait velocity: decreased Stairs: No    Exercises     PT Diagnosis: Difficulty walking;Altered mental status  PT Problem List: Decreased strength;Decreased cognition;Decreased knowledge of use of DME;Decreased activity tolerance;Decreased balance;Decreased mobility;Decreased coordination;Decreased safety awareness PT Treatment Interventions: Gait training;DME instruction;Stair training;Functional mobility training;Therapeutic activities;Therapeutic exercise;Balance training;Patient/family education;Cognitive remediation   PT Goals Acute Rehab PT Goals PT Goal Formulation: With patient Time For Goal Achievement: 02/24/12 Potential to Achieve Goals: Fair Pt will go Supine/Side to Sit: with modified independence;with HOB 0 degrees PT Goal: Supine/Side to Sit - Progress: Goal set today Pt will go Sit to Supine/Side: with modified independence;with HOB 0 degrees PT Goal: Sit to Supine/Side - Progress: Goal set today Pt will go Sit to Stand: with supervision PT Goal: Sit to Stand - Progress: Goal set today Pt will go Stand to Sit: with supervision PT Goal: Stand to Sit - Progress: Goal set today Pt will Ambulate: 51 - 150 feet;with supervision;with least restrictive assistive device PT Goal: Ambulate - Progress: Goal set today Pt will Go Up / Down Stairs: 1-2 stairs;with min assist;with least restrictive assistive device PT Goal: Up/Down Stairs - Progress: Goal set today  Visit Information  Last PT Received On: 02/10/12 Assistance Needed: +1    Subjective Data  Subjective: I still feel dizzy when I walk Patient Stated Goal: return to work at [employer]   Prior Functioning  Home Living Lives With: Spouse Available Help at Discharge: Family Type of Home: House Home Access: Stairs to enter Secretary/administrator of Steps: 2 Home Layout: One level Bathroom Shower/Tub: Teacher, music: Standard Home Adaptive Equipment: Straight cane  Prior Function Level of Independence: Needs assistance Needs Assistance: Light Housekeeping;Meal Prep Meal Prep: Maximal Light Housekeeping: Maximal Able to Take Stairs?: Yes Driving: Yes Vocation: Part time employment Communication Communication: No difficulties    Cognition  Overall Cognitive Status: Impaired Area of Impairment: Problem solving;Memory Arousal/Alertness: Awake/alert Orientation Level: Appears intact for tasks assessed Behavior During Session: [facility] for tasks performed Memory Deficits: pt with decreased ability to state home setup Cognition - Other Comments: pt with disjointed throughts at times during session    Extremity/Trunk Assessment Right Lower Extremity Assessment RLE ROM/Strength/Tone: WFL for tasks assessed RLE Coordination: Deficits RLE Coordination Deficits: pt with difficulty coordinating movement with transferring to EOB and gait Left Lower Extremity Assessment LLE ROM/Strength/Tone: WFL for tasks assessed LLE Coordination: Deficits LLE Coordination Deficits: pt with difficulty coordinating movement with transferring to EOB and gait Trunk Assessment Trunk Assessment: Normal   Balance Static Sitting Balance Static Sitting - Balance Support: Bilateral upper extremity supported;Feet supported Static Sitting - Level of Assistance: 5: Stand by assistance Static Sitting - Comment/# of Minutes: 3 Static Standing Balance Static Standing - Balance Support: Bilateral upper extremity supported Static Standing - Level of Assistance: 4: Min assist Static Standing - Comment/# of Minutes: 2  End of Session PT - End of Session Equipment Utilized During Treatment: Gait belt Activity Tolerance: Patient tolerated treatment well Patient left: in chair;with call bell/phone within reach;Other (comment) (sitter present) Nurse Communication: Mobility status  GP     Toney Sang Beth 02/10/2012,  4:54 PM  Delaney Meigs, PT [phone]

## 2012-02-10 NOTE — Progress Notes (Signed)
Name: Kayvon Mo MRN: [id number] DOB: [birth year]    LOS: 1  Referring Provider:  Isidoro Donning ([facility])  Reason for Referral:  sepsis  PULMONARY / CRITICAL CARE MEDICINE  HPI:  71yo male with hx HTN, seizure disorder, DM presented 8/18 with fevers (102.9), chills and ??seizure,  Per pts wife he has "not been himself" for 1-2 weeks with loss of appetite, diarrhea, chills and generalized weakness.  In ER, u/a was grossly positive for UTI, CXR clear and dilantin level low.  He was given dilantin, abx for UTI and admitted by [facility] to SDU.  However on admission to SDU he was noted to be significantly hypotensive with SBP 70's despite 3L fluids and PCCM consulted for septic shock and ICU tx.    Review Of Systems:  C/o malaise, diarrhea, loss of appetite, chills.  Denies chest pain, SOB, hemoptysis, abd pain. ALl other systems reviewed and were neg.   Vital Signs: Temp:  [97.4 F (36.3 C)-99.8 F (37.7 C)] 97.7 F (36.5 C) (08/19 0750) Pulse Rate:  [106-138] 111  (08/19 0800) Resp:  [18-42] 28  (08/19 0800) BP: (72-133)/(53-96) 133/96 mmHg (08/19 0800) SpO2:  [94 %-100 %] 100 % (08/19 0800) Weight:  [61.2 kg (134 lb 14.7 oz)-62.3 kg (137 lb 5.6 oz)] 62.3 kg (137 lb 5.6 oz) (08/19 0500)  Physical Examination: General:  Acutely ill appearing male, mild distress Neuro:  Awake, alert, appropriate, MAE HEENT:  Mm dry, no JVD Cardiovascular:  s1s2 tachy Lungs:  resps even, slightly tachypneic, non labored, cta Abdomen:  Distended, reasonably soft, hypoactive bs, mildly tender diffusely Ext: cool, clammy, diaphoretic, no edema   Principal Problem:  *SIRS (systemic inflammatory response syndrome) Active Problems:  Seizures, generalized convulsive  HTN (hypertension)  DM (diabetes mellitus), type 2  UTI (lower urinary tract infection)  Abdominal distension  Septic shock   ASSESSMENT AND PLAN  PULMONARY  Lab 02/09/12 2215 02/09/12 2100 02/09/12 2000 02/09/12 1730 02/09/12 1500  PHART -- -- --  -- --  PCO2ART -- -- -- -- --  PO2ART -- -- -- -- --  HCO3 -- -- -- -- --  O2SAT 59.7 58.6 60.8 61.3 51.5   Ventilator Settings:   CXR:  8/18>>>clear ETT:  N/A  A:  Tachypnea - r/t Sepsis.   P:   Pulm status currently stable  IS per RT protocol OOB to chair and PT/OT.  CARDIOVASCULAR  Lab 02/10/12 0209 02/09/12 2000 02/09/12 1728 02/09/12 1241 02/09/12 1053 02/09/12 1031  TROPONINI <0.30 -- <0.30 <0.30 <0.30 --  LATICACIDVEN -- 4.9* -- -- -- 4.1*  PROBNP -- -- -- -- -- --   ECG:  ST Lines: L IJ TLC 8/18>>>  A: Septic shock  Hypotension   P:  Sepsis protocol  D/C CVPs. D/C Levophed  Cortisol level 70.8 D/C stress steroids  RENAL  Lab 02/10/12 0209 02/09/12 1242 02/09/12 0606  NA 137 -- 130*  K 3.1* -- 4.2  CL 103 -- 89*  CO2 17* -- 25  BUN 21 -- 17  CREATININE 0.91 1.04 0.78  CALCIUM 7.8* -- 9.8  MG -- -- --  PHOS -- -- --   Intake/Output      08/18 0701 - 08/19 0700 08/19 0701 - 08/20 0700   P.O. 60    I.V. (mL/kg) 4552.7 (73.1) 15 (0.2)   IV Piggyback 610    Total Intake(mL/kg) 5222.7 (83.8) 15 (0.2)   Urine (mL/kg/hr) 550 (0.4) 75   Total Output 550 75   Net +4672.7 -60  Stool Occurrence 3 x     Foley:  8/18>>  A:  No acute issue P:   BMET in AM F/u chem   GASTROINTESTINAL  Lab 02/09/12 1038  AST 49*  ALT 34  ALKPHOS 409*  BILITOT 1.0  PROT 6.0  ALBUMIN 2.6*    A:  Abd distension Diarrhea  Elevated Alk Phos? P:   CDiff PCR positive, on PO vanc and IV flagyl.  Abd ultrasound pending  HEMATOLOGIC  Lab 02/10/12 0209 02/09/12 1530 02/09/12 1242 02/09/12 0606  HGB 11.1* -- 11.4* 13.0  HCT 31.9* -- 33.0* 36.5*  PLT 86* 78* 86* 148*  INR -- 1.34 -- --  APTT -- 37 -- --   A:   Thrombocytopenia; due to sepsis ? DIC? HUS? P:  F/u cbc  D/c lovenox  SCD's for now Peripheral smear   INFECTIOUS  Lab 02/10/12 0209 02/09/12 1242 02/09/12 1038 02/09/12 0606  WBC 16.7* 9.0 -- 14.9*  PROCALCITON -- -- 46.35 --    Cultures: BCx2 8/18>>> Urine 8/18>>>  Antibiotics: Rocephin 8/18>>>8/18 Vanc PO 8/18>>> 8/18 Zosyn>> Vanc IV 8/18>>> Flagyl 8/18>>>  A:  UTI Diarrhea  Elevated alk phos and mild abdominal pain worrisome, worrisome for cholecystitis but exam benign P:   Abx as above  Sepsis protocol completed. F/U Ab [place] pending results.   ENDOCRINE  Lab 02/10/12 0748 02/10/12 0410 02/09/12 2332 02/09/12 2000 02/09/12 1401  GLUCAP 111* 144* 188* 242* 202*   A:  DM   P:   ICU hyperglycemia protocol  May need lantus with addition steroids   NEUROLOGIC  A:  No acute issue - remains awake/ alert  P:   D/c sedating meds   Transfer to SDU and TRH to pick up in AM, PCCM signing off, please call back if needed.  Alyson Reedy, M.D. [facility] Pulmonary/Critical Care Medicine. Pager: [phone]. After hours pager: [phone].

## 2012-02-10 NOTE — Progress Notes (Signed)
eLink Physician-Brief Progress Note Patient Name: Joseph Copeland DOB: 1940-12-07 MRN: [id number]  Date of Service  02/10/2012   HPI/Events of Note   cvp 23, int edema concern from RNm, gross pos balance  eICU Interventions  kvo      Nelda Bucks. 02/10/2012, 4:32 AM

## 2012-02-10 NOTE — Progress Notes (Signed)
PHARMACY - CRITICAL CARE PROGRESS NOTE  Pharmacy Consult for Phenytoin Indication: Breakthrough Seizures   No Known Allergies  Patient Measurements: Height: 5' 1.81" (157 cm) Weight: 137 lb 5.6 oz (62.3 kg) IBW/kg (Calculated) : 54.17   Vital Signs: Temp: 97.7 F (36.5 C) (08/19 0750) Temp src: Oral (08/19 0750) BP: 127/84 mmHg (08/19 1000) Pulse Rate: 110  (08/19 1000) Intake/Output from previous day: 08/18 0701 - 08/19 0700 In: 5222.7 [P.O.:60; I.V.:4552.7; IV Piggyback:610] Out: 550 [Urine:550] Intake/Output from this shift: Total I/O In: 65 [I.V.:15; IV Piggyback:50] Out: 75 [Urine:75] Vent settings for last 24 hours:    Labs:  [facility] 02/10/12 0209 02/09/12 1530 02/09/12 1242 02/09/12 1038 02/09/12 0606  WBC 16.7* -- 9.0 -- 14.9*  HGB 11.1* -- 11.4* -- 13.0  HCT 31.9* -- 33.0* -- 36.5*  PLT 86* 78* 86* -- --  APTT -- 37 -- -- --  INR -- 1.34 -- -- --  CREATININE 0.91 -- 1.04 -- 0.78  LABCREA -- -- -- -- --  CREATININE 0.91 -- 1.04 -- 0.78  LABCREA -- -- -- -- --  CREAT24HRUR -- -- -- -- --  MG -- -- -- -- --  PHOS -- -- -- -- --  ALBUMIN -- -- -- 2.6* --  PROT -- -- -- 6.0 --  AST -- -- -- 49* --  ALT -- -- -- 34 --  ALKPHOS -- -- -- 409* --  BILITOT -- -- -- 1.0 --  BILIDIR -- -- -- 0.6* --  IBILI -- -- -- 0.4 --   Estimated Creatinine Clearance: 57.9 ml/min (by C-G formula based on Cr of 0.91).   Basename 02/10/12 0748 02/10/12 0410 02/09/12 2332  GLUCAP 111* 144* 188*    Microbiology: Recent Results (from the past 720 hour(s))  URINE CULTURE     Status: Normal (Preliminary result)   Collection Time   02/09/12  8:52 AM      Component Value Range Status Comment   Specimen Description URINE, CLEAN CATCH   Final    Special Requests ADDED ON 02/09/12 AT 1050   Final    Culture  Setup Time 02/09/2012 16:54   Final    Colony Count >=100,000 COLONIES/ML   Final    Culture ESCHERICHIA COLI   Final    Report Status PENDING   Incomplete   CULTURE,  BLOOD (ROUTINE X 2)     Status: Normal (Preliminary result)   Collection Time   02/09/12 10:41 AM      Component Value Range Status Comment   Specimen Description BLOOD LEFT ARM   Final    Special Requests BOTTLES DRAWN AEROBIC AND ANAEROBIC 10CC   Final    Culture  Setup Time 02/09/2012 16:55   Final    Culture     Final    Value:        BLOOD CULTURE RECEIVED NO GROWTH TO DATE CULTURE WILL BE HELD FOR 5 DAYS BEFORE ISSUING A FINAL NEGATIVE REPORT   Report Status PENDING   Incomplete   CULTURE, BLOOD (ROUTINE X 2)     Status: Normal (Preliminary result)   Collection Time   02/09/12 10:46 AM      Component Value Range Status Comment   Specimen Description BLOOD LEFT HAND   Final    Special Requests BOTTLES DRAWN AEROBIC AND ANAEROBIC 10CC   Final    Culture  Setup Time 02/09/2012 16:55   Final    Culture     Final  Value:        BLOOD CULTURE RECEIVED NO GROWTH TO DATE CULTURE WILL BE HELD FOR 5 DAYS BEFORE ISSUING A FINAL NEGATIVE REPORT   Report Status PENDING   Incomplete   MRSA PCR SCREENING     Status: Normal   Collection Time   02/09/12 12:49 PM      Component Value Range Status Comment   MRSA by PCR NEGATIVE  NEGATIVE Final   CLOSTRIDIUM DIFFICILE BY PCR     Status: Abnormal   Collection Time   02/09/12  3:18 PM      Component Value Range Status Comment   C difficile by pcr POSITIVE (*) NEGATIVE Final     Medications:  Scheduled:    . antiseptic oral rinse  15 mL Mouth Rinse q12n4p  . aspirin  81 mg Oral Daily  . chlorhexidine  15 mL Mouth Rinse BID  . famotidine  20 mg Oral QAC supper  . insulin aspart  0-4 Units Subcutaneous Q4H  . vancomycin  500 mg Oral Q6H   And  . metronidazole  500 mg Intravenous Q8H  . multivitamin with minerals  1 tablet Oral Daily  . phenytoin (DILANTIN) IV  500 mg Intravenous Once  . phenytoin  300 mg Oral QHS  . piperacillin-tazobactam (ZOSYN)  IV  3.375 g Intravenous Q8H  . potassium chloride  10 mEq Intravenous Q1 Hr x 4  . sodium  chloride  1,000 mL Intravenous Once  . sodium chloride  25 mL/kg Intravenous Once  . vancomycin  1,250 mg Intravenous Q24H  . white petrolatum      . DISCONTD: cefTRIAXone (ROCEPHIN)  IV  1 g Intravenous Q24H  . DISCONTD: cefTRIAXone (ROCEPHIN)  IV  1 g Intravenous Q24H  . DISCONTD: cefTRIAXone (ROCEPHIN)  IV  2 g Intravenous Q24H  . DISCONTD: enoxaparin (LOVENOX) injection  40 mg Subcutaneous Q24H  . DISCONTD: famotidine (PEPCID) IV  20 mg Intravenous Q24H  . DISCONTD: hydrocortisone sodium succinate  50 mg Intravenous Q6H  . DISCONTD: insulin aspart  0-5 Units Subcutaneous QHS  . DISCONTD: insulin aspart  0-9 Units Subcutaneous TID WC  . DISCONTD: metoprolol tartrate  25 mg Oral Daily  . DISCONTD: phenytoin  200 mg Oral QHS  . DISCONTD: phenytoin  300 mg Oral QHS  . DISCONTD: phenytoin  50 mg Oral QHS  . DISCONTD: sodium chloride  500 mL Intravenous Once  . DISCONTD: vancomycin  1,000 mg Intravenous Q24H    Assessment: Pt is a 71 YOM admitted with breakthrough seizure, fever, and chills. On Dilantin PTA. In May 2013, pt was reduced from dilantin 300mg  at hs to 200mg  at hs by primary MD. On 02/06/12 office visit, pt was increased to dilantin 250mg  at hs. Corrected dilantin level on admission was 11 (uncorrected 6.9): level not reflective of recent increase to dilantin 250mg . Per neurology recommendation: IV bolus of 500mg  of dilantin and start dilantin 300mg  at hs. Corrected phenytoin level of 23 today is reflective of the IV load and po dose that the pt received on 8/18. Will continue with current dose recommended by neurology and check a phenytoin level. Scr 0.91, CrCl 55-90mL/min  Goal of Therapy:  Phenytoin level 10-20 Prevention of seizures  Plan: 1. Continue dilantin 300mg  at bedtime per neurology recommendation 2. Dilantin level in 4-5 days 3. Monitor for signs/symptoms of phenytoin toxicity 4. Monitor renal function  Abran Duke, PharmD Clinical Pharmacist Phone:  [phone] Pager: [phone] 02/10/2012 11:56 AM

## 2012-02-11 ENCOUNTER — Inpatient Hospital Stay (HOSPITAL_COMMUNITY): Payer: Medicare Other

## 2012-02-11 ENCOUNTER — Other Ambulatory Visit: Payer: Self-pay

## 2012-02-11 DIAGNOSIS — G931 Anoxic brain damage, not elsewhere classified: Secondary | ICD-10-CM

## 2012-02-11 DIAGNOSIS — F05 Delirium due to known physiological condition: Secondary | ICD-10-CM | POA: Diagnosis not present

## 2012-02-11 DIAGNOSIS — I469 Cardiac arrest, cause unspecified: Secondary | ICD-10-CM | POA: Diagnosis not present

## 2012-02-11 DIAGNOSIS — R0902 Hypoxemia: Secondary | ICD-10-CM

## 2012-02-11 DIAGNOSIS — J80 Acute respiratory distress syndrome: Secondary | ICD-10-CM

## 2012-02-11 DIAGNOSIS — J9600 Acute respiratory failure, unspecified whether with hypoxia or hypercapnia: Secondary | ICD-10-CM

## 2012-02-11 DIAGNOSIS — J690 Pneumonitis due to inhalation of food and vomit: Secondary | ICD-10-CM

## 2012-02-11 DIAGNOSIS — J9589 Other postprocedural complications and disorders of respiratory system, not elsewhere classified: Secondary | ICD-10-CM

## 2012-02-11 LAB — CBC
HCT: 31.8 % — ABNORMAL LOW (ref 39.0–52.0)
Hemoglobin: 11.1 g/dL — ABNORMAL LOW (ref 13.0–17.0)
Hemoglobin: 10.7 g/dL — ABNORMAL LOW (ref 13.0–17.0)
MCHC: 35.1 g/dL (ref 30.0–36.0)
MCHC: 33.6 g/dL (ref 30.0–36.0)
Platelets: 90 10*3/uL — ABNORMAL LOW (ref 150–400)
RDW: 18.6 % — ABNORMAL HIGH (ref 11.5–15.5)
WBC: 31.6 10*3/uL — ABNORMAL HIGH (ref 4.0–10.5)

## 2012-02-11 LAB — POCT I-STAT 3, ART BLOOD GAS (G3+)
Acid-base deficit: 16 mmol/L — ABNORMAL HIGH (ref 0.0–2.0)
Bicarbonate: 10.5 mEq/L — ABNORMAL LOW (ref 20.0–24.0)
O2 Saturation: 100 %
TCO2: 11 mmol/L (ref 0–100)
pH, Arterial: 7.2 — ABNORMAL LOW (ref 7.350–7.450)

## 2012-02-11 LAB — GLUCOSE, CAPILLARY
Glucose-Capillary: 137 mg/dL — ABNORMAL HIGH (ref 70–99)
Glucose-Capillary: 118 mg/dL — ABNORMAL HIGH (ref 70–99)
Glucose-Capillary: 176 mg/dL — ABNORMAL HIGH (ref 70–99)
Glucose-Capillary: 77 mg/dL (ref 70–99)

## 2012-02-11 LAB — BASIC METABOLIC PANEL
BUN: 28 mg/dL — ABNORMAL HIGH (ref 6–23)
Calcium: 8.3 mg/dL — ABNORMAL LOW (ref 8.4–10.5)
GFR calc Af Amer: >90 mL/min (ref >90)
GFR calc non Af Amer: 86 mL/min — ABNORMAL LOW (ref >90)
Potassium: 3.3 mEq/L — ABNORMAL LOW (ref 3.5–5.1)
Sodium: 137 mEq/L (ref 135–145)

## 2012-02-11 LAB — COMPREHENSIVE METABOLIC PANEL
ALT: 49 U/L (ref 0–53)
Albumin: 2.4 g/dL — ABNORMAL LOW (ref 3.5–5.2)
Alkaline Phosphatase: 116 U/L (ref 39–117)
BUN: 26 mg/dL — ABNORMAL HIGH (ref 6–23)
Chloride: 104 mEq/L (ref 96–112)
Potassium: 3.4 mEq/L — ABNORMAL LOW (ref 3.5–5.1)
Sodium: 141 mEq/L (ref 135–145)
Total Bilirubin: 0.5 mg/dL (ref 0.3–1.2)
Total Protein: 6.2 g/dL (ref 6.0–8.3)

## 2012-02-11 LAB — URINE CULTURE: Colony Count: >=100000

## 2012-02-11 LAB — MAGNESIUM: Magnesium: 1.7 mg/dL (ref 1.5–2.5)

## 2012-02-11 LAB — PHOSPHORUS: Phosphorus: 2.1 mg/dL — ABNORMAL LOW (ref 2.3–4.6)

## 2012-02-11 LAB — ALBUMIN: Albumin: 2.2 g/dL — ABNORMAL LOW (ref 3.5–5.2)

## 2012-02-11 MED ORDER — PHENYTOIN SODIUM 50 MG/ML IJ SOLN
100.0000 mg | Freq: Three times a day (TID) | INTRAMUSCULAR | Status: DC
  Administered 2012-02-11 – 2012-02-12 (×2): 100 mg via INTRAVENOUS
  Filled 2012-02-11 (×5): qty 2

## 2012-02-11 MED ORDER — RANITIDINE HCL 150 MG/10ML PO SYRP
150.0000 mg | ORAL_SOLUTION | Freq: Every day | ORAL | Status: DC
  Administered 2012-02-11 – 2012-02-14 (×4): 150 mg
  Filled 2012-02-11 (×4): qty 10

## 2012-02-11 MED ORDER — PRO-STAT SUGAR FREE PO LIQD
30.0000 mL | Freq: Two times a day (BID) | ORAL | Status: DC
  Administered 2012-02-11 – 2012-02-12 (×3): 30 mL
  Filled 2012-02-11 (×4): qty 30

## 2012-02-11 MED ORDER — SODIUM CHLORIDE 0.45 % IV SOLN
INTRAVENOUS | Status: DC
  Administered 2012-02-11: 13:00:00 via INTRAVENOUS

## 2012-02-11 MED ORDER — MIDAZOLAM HCL 2 MG/2ML IJ SOLN
1.0000 mg | INTRAMUSCULAR | Status: DC | PRN
  Administered 2012-02-11: 2 mg via INTRAVENOUS
  Administered 2012-02-11 (×2): 1 mg via INTRAVENOUS
  Filled 2012-02-11 (×4): qty 2

## 2012-02-11 MED ORDER — DOPAMINE-DEXTROSE 3.2-5 MG/ML-% IV SOLN
INTRAVENOUS | Status: AC
  Filled 2012-02-11: qty 250

## 2012-02-11 MED ORDER — HALOPERIDOL LACTATE 5 MG/ML IJ SOLN
2.0000 mg | Freq: Four times a day (QID) | INTRAMUSCULAR | Status: DC | PRN
Start: 2012-02-11 — End: 2012-02-14
  Filled 2012-02-11: qty 0.4

## 2012-02-11 MED ORDER — OXEPA PO LIQD
1000.0000 mL | ORAL | Status: DC
  Administered 2012-02-11: 1000 mL
  Filled 2012-02-11 (×3): qty 1000

## 2012-02-11 MED ORDER — POTASSIUM CHLORIDE IN NACL 20-0.9 MEQ/L-% IV SOLN
INTRAVENOUS | Status: DC
  Administered 2012-02-11: 75 mL/h via INTRAVENOUS
  Filled 2012-02-11 (×2): qty 1000

## 2012-02-11 MED ORDER — LORAZEPAM 2 MG/ML IJ SOLN
1.0000 mg | INTRAMUSCULAR | Status: DC | PRN
  Administered 2012-02-11: 1 mg via INTRAVENOUS
  Filled 2012-02-11: qty 1

## 2012-02-11 MED ORDER — SODIUM BICARBONATE 8.4 % IV SOLN
INTRAVENOUS | Status: DC
  Administered 2012-02-11 – 2012-02-12 (×2): via INTRAVENOUS
  Filled 2012-02-11 (×3): qty 150

## 2012-02-11 MED ORDER — FENTANYL CITRATE 0.05 MG/ML IJ SOLN
25.0000 ug | INTRAMUSCULAR | Status: DC | PRN
  Administered 2012-02-11: 25 ug via INTRAVENOUS
  Administered 2012-02-11: 50 ug via INTRAVENOUS
  Administered 2012-02-11: 25 ug via INTRAVENOUS
  Filled 2012-02-11 (×5): qty 2

## 2012-02-11 MED ORDER — PHENYLEPHRINE HCL 10 MG/ML IJ SOLN
30.0000 ug/min | INTRAVENOUS | Status: DC
  Filled 2012-02-11 (×2): qty 1

## 2012-02-11 MED ORDER — LORAZEPAM 2 MG/ML IJ SOLN
INTRAMUSCULAR | Status: AC
  Filled 2012-02-11: qty 1

## 2012-02-11 MED FILL — Medication: Qty: 1 | Status: AC

## 2012-02-11 NOTE — Procedures (Signed)
Cardiopulmonary Resuscitation  Aspiration then seizure resulting in cardiac arrest.  CPR x8 hours with epi, atropine and amiodarone.  Successful resuscitation, please see code note.  Alyson Reedy, M.D. [facility] Pulmonary/Critical Care Medicine. Pager: [phone]. After hours pager: [phone].

## 2012-02-11 NOTE — Progress Notes (Signed)
Utilization Review Completed.Joseph Copeland T8/20/2013

## 2012-02-11 NOTE — Progress Notes (Signed)
INITIAL ADULT NUTRITION ASSESSMENT Date: 02/11/2012   Time: 1:57 PM  Reason for Assessment: MD Consult for TF initiation and managment  INTERVENTION:  Start TF once enteral feeding tube placed and placement verified:  Oxepa at 20 ml/h, increase by 10 ml every 4 hours to goal rate of 40 ml/h with Prostat 30 ml BID to provide 1640 kcals, 90 grams protein, 754 ml free water daily.   DOCUMENTATION CODES Per approved criteria  -Not Applicable    ASSESSMENT: Male 71 y.o.  Dx: SIRS (systemic inflammatory response syndrome)  Hx:  Past Medical History  Diagnosis Date  . Hypertension   . Arthritis   . Seizures     first12/12-none since  . Diabetes mellitus     type 2-no meds  . Sleep apnea     nasal CPAP at [facility]   Past Surgical History  Procedure Date  . Hemrhoidectomy   . Colonoscopy   . Mass excision 10/07/2011    Procedure: EXCISION MASS;  Surgeon: Louisa Second, MD;  Location: [facility];  Service: Plastics;  Laterality: Left;  excision of large mass on left back with possible lipo assistence  . Liposuction 10/07/2011    Procedure: LIPOSUCTION;  Surgeon: Louisa Second, MD;  Location: [facility];  Service: Plastics;  Laterality: Left;    Related Meds:  Scheduled Meds:   . antiseptic oral rinse  15 mL Mouth Rinse q12n4p  . aspirin  81 mg Oral Daily  . chlorhexidine  15 mL Mouth Rinse BID  . DOPamine      . famotidine  20 mg Oral QAC supper  . insulin aspart  0-4 Units Subcutaneous Q4H  . LORazepam      . vancomycin  500 mg Oral Q6H   And  . metronidazole  500 mg Intravenous Q8H  . multivitamin with minerals  1 tablet Oral Daily  . phenytoin  300 mg Oral QHS  . piperacillin-tazobactam (ZOSYN)  IV  3.375 g Intravenous Q8H  . vancomycin  1,250 mg Intravenous Q24H   Continuous Infusions:   . sodium chloride 75 mL/hr at 02/11/12 1310  . phenylephrine (NEO-SYNEPHRINE) Adult infusion    .  sodium bicarbonate infusion 1000 mL 50 mL/hr at  02/11/12 1310  . DISCONTD: sodium chloride 20 mL/hr at 02/10/12 0500  . DISCONTD: 0.9 % NaCl with KCl 20 mEq / L 75 mL/hr (02/11/12 0906)  . DISCONTD: dextrose     PRN Meds:.acetaminophen, acetaminophen, alum & mag hydroxide-simeth, fentaNYL, haloperidol lactate, LORazepam, midazolam, ondansetron (ZOFRAN) IV, ondansetron, DISCONTD: dextrose   Ht: 5\' 6"  (167.6 cm)  Wt: 142 lb 10.2 oz (64.7 kg)  Ideal Wt: 64.5 kg % Ideal Wt: 100%  Wt Readings from Last 10 Encounters:  02/11/12 142 lb 10.2 oz (64.7 kg)  10/03/11 135 lb (61.236 kg)  10/03/11 135 lb (61.236 kg)  06/04/11 124 lb (56.246 kg)    Usual Wt: 135 lb (4 months ago) % Usual Wt: 105%  Body mass index is 23.02 kg/(m^2).  Food/Nutrition Related Hx: No nutrition problems identified on admission nutrition screen.   Labs:  CMP     Component Value Date/Time   NA 141 02/11/2012 1140   K 3.4* 02/11/2012 1140   CL 104 02/11/2012 1140   CO2 13* 02/11/2012 1140   GLUCOSE 195* 02/11/2012 1140   BUN 26* 02/11/2012 1140   CREATININE 0.92 02/11/2012 1140   CALCIUM 8.3* 02/11/2012 1140   PROT 6.2 02/11/2012 1140   ALBUMIN 2.4* 02/11/2012 1140  AST 57* 02/11/2012 1140   ALT 49 02/11/2012 1140   ALKPHOS 116 02/11/2012 1140   BILITOT 0.5 02/11/2012 1140   GFRNONAA 83* 02/11/2012 1140   GFRAA >90 02/11/2012 1140    CBG (last 3)   Basename 02/11/12 1217 02/11/12 0801 02/11/12 0427  GLUCAP 170* 118* 137*    Sodium  Date/Time Value Range Status  02/11/2012 11:40 AM 141  135 - 145 mEq/L Final  02/11/2012  5:12 AM 137  135 - 145 mEq/L Final  02/10/2012  2:09 AM 137  135 - 145 mEq/L Final    Potassium  Date/Time Value Range Status  02/11/2012 11:40 AM 3.4* 3.5 - 5.1 mEq/L Final  02/11/2012  5:12 AM 3.3* 3.5 - 5.1 mEq/L Final  02/10/2012  2:09 AM 3.1* 3.5 - 5.1 mEq/L Final    Phosphorus  Date/Time Value Range Status  02/11/2012  5:12 AM 2.1* 2.3 - 4.6 mg/dL Final  16/03/9603  5:40 AM 2.6  2.3 - 4.6 mg/dL Final  98/04/9146  8:29 PM 1.1*  2.3 - 4.6 mg/dL Final    Magnesium  Date/Time Value Range Status  02/11/2012  5:12 AM 1.7  1.5 - 2.5 mg/dL Final  56/21/3086  5:78 AM 2.0  1.5 - 2.5 mg/dL Final  46/96/2952  8:41 PM 1.4* 1.5 - 2.5 mg/dL Final     Intake/Output Summary (Last 24 hours) at 02/11/12 1359 Last data filed at 02/11/12 1310  Gross per 24 hour  Intake   1725 ml  Output    930 ml  Net    795 ml    Diet Order: NPO   IVF:    sodium chloride Last Rate: 75 mL/hr at 02/11/12 1310  phenylephrine (NEO-SYNEPHRINE) Adult infusion   sodium bicarbonate infusion 1000 mL Last Rate: 50 mL/hr at 02/11/12 1310  DISCONTD: sodium chloride Last Rate: 20 mL/hr at 02/10/12 0500  DISCONTD: 0.9 % NaCl with KCl 20 mEq / L Last Rate: 75 mL/hr (02/11/12 0906)  DISCONTD: dextrose     Temp (24hrs), Avg:98.2 F (36.8 C), Min:97.3 F (36.3 C), Max:98.5 F (36.9 C)   Estimated Nutritional Needs:   Kcal: 1750 Protein: 85-100 grams Fluid: 1.7-1.9 liters  Patient with decreased appetite and diarrhea for 1-2 weeks PTA.  Weight seems to be trending up since April.  Noted skin breakdown along thoracic spine.  Received consult for TF initiation and management.  Enteral feeding tube has not been placed yet.  NUTRITION DIAGNOSIS: -Inadequate oral intake (NI-2.1).  Status: Ongoing  RELATED TO: inability to eat   AS EVIDENCED BY: NPO status  MONITORING/EVALUATION(Goals): Goal:  Intake to meet >90% of estimated nutrition needs. Monitor:  TF tolerance, labs, weight trend.  EDUCATION NEEDS: -Education not appropriate at this time   Joaquin Courts, RD, CNSC, [place] Pager# [phone] After Hours Pager# [phone]  02/11/2012, 1:57 PM

## 2012-02-11 NOTE — Progress Notes (Signed)
The sitter called for assistance for pt asking to sit on the chair, pt was sitting on the side of the bed and he became pale, started vomiting and became unresponsive no pulse no breathing code blue was called. Pt was transferred to  2100 report given to the charge nurse.

## 2012-02-11 NOTE — Procedures (Signed)
Intubation Procedure Note Joseph Copeland [id number] 12-May-1941  Procedure: Intubation Indications: Airway protection and maintenance  Procedure Details Consent: Unable to obtain consent because of emergent medical necessity. Time Out: Verified patient identification, verified procedure, site/side was marked, verified correct patient position, special equipment/implants available, medications/allergies/relevent history reviewed, required imaging and test results available.  Performed  Maximum sterile technique was used including antiseptics, gloves and hand hygiene.  MAC    Evaluation Hemodynamic Status: BP stable throughout; O2 sats: stable throughout Patient's Current Condition: stable Complications: No apparent complications Patient did tolerate procedure well. Chest X-ray ordered to verify placement.  CXR: pending.   Joseph Copeland 02/11/2012

## 2012-02-11 NOTE — Progress Notes (Signed)
PHARMACY - CRITICAL CARE PROGRESS NOTE  Pharmacy Consult for Phenytoin Indication: Breakthrough Seizures   No Known Allergies  Patient Measurements: Height: 5\' 6"  (167.6 cm) Weight: 142 lb 10.2 oz (64.7 kg) IBW/kg (Calculated) : 63.8   Vital Signs: Temp: 98.5 F (36.9 C) (08/20 0425) Temp src: Oral (08/20 0425) BP: 98/75 mmHg (08/20 1145) Pulse Rate: 111  (08/20 1130) Intake/Output from previous day: 08/19 0701 - 08/20 0700 In: 1670 [P.O.:600; I.V.:345; IV Piggyback:725] Out: 835 [Urine:835] Intake/Output from this shift: Total I/O In: 550 [I.V.:275; IV Piggyback:275] Out: 280 [Urine:280] Vent settings for last 24 hours: Vent Mode:  [-] PRVC FiO2 (%):  [50 %-100 %] 50 % Set Rate:  [20 bmp] 20 bmp Vt Set:  [500 mL] 500 mL PEEP:  [5 cmH20-12 cmH20] 8 cmH20  Labs:  Basename 02/11/12 1140 02/11/12 0512 02/10/12 0209 02/09/12 1530 02/09/12 1038  WBC 31.6* 21.4* 16.7* -- --  HGB 10.7* 11.1* 11.1* -- --  HCT 31.8* 31.6* 31.9* -- --  PLT 100* 90* 86* -- --  APTT -- -- -- 37 --  INR -- -- -- 1.34 --  CREATININE 0.92 0.86 0.91 -- --  LABCREA -- -- -- -- --  CREATININE 0.92 0.86 0.91 -- --  LABCREA -- -- -- -- --  CREAT24HRUR -- -- -- -- --  MG -- 1.7 -- -- --  PHOS -- 2.1* -- -- --  ALBUMIN 2.4* -- -- -- 2.6*  PROT 6.2 -- -- -- 6.0  AST 57* -- -- -- 49*  ALT 49 -- -- -- 34  ALKPHOS 116 -- -- -- 409*  BILITOT 0.5 -- -- -- 1.0  BILIDIR -- -- -- -- 0.6*  IBILI -- -- -- -- 0.4   Estimated Creatinine Clearance: 67.4 ml/min (by C-G formula based on Cr of 0.92).   Basename 02/11/12 1217 02/11/12 0801 02/11/12 0427  GLUCAP 170* 118* 137*    Microbiology: Recent Results (from the past 720 hour(s))  URINE CULTURE     Status: Normal   Collection Time   02/09/12  8:52 AM      Component Value Range Status Comment   Specimen Description URINE, CLEAN CATCH   Final    Special Requests ADDED ON 02/09/12 AT 1050   Final    Culture  Setup Time 02/09/2012 16:54   Final    Colony Count >=100,000 COLONIES/ML   Final    Culture ESCHERICHIA COLI   Final    Report Status 02/11/2012 FINAL   Final    Organism ID, Bacteria ESCHERICHIA COLI   Final   CULTURE, BLOOD (ROUTINE X 2)     Status: Normal (Preliminary result)   Collection Time   02/09/12 10:41 AM      Component Value Range Status Comment   Specimen Description BLOOD LEFT ARM   Final    Special Requests BOTTLES DRAWN AEROBIC AND ANAEROBIC 10CC   Final    Culture  Setup Time 02/09/2012 16:55   Final    Culture     Final    Value:        BLOOD CULTURE RECEIVED NO GROWTH TO DATE CULTURE WILL BE HELD FOR 5 DAYS BEFORE ISSUING A FINAL NEGATIVE REPORT   Report Status PENDING   Incomplete   CULTURE, BLOOD (ROUTINE X 2)     Status: Normal (Preliminary result)   Collection Time   02/09/12 10:46 AM      Component Value Range Status Comment   Specimen Description BLOOD LEFT  HAND   Final    Special Requests BOTTLES DRAWN AEROBIC AND ANAEROBIC 10CC   Final    Culture  Setup Time 02/09/2012 16:55   Final    Culture     Final    Value:        BLOOD CULTURE RECEIVED NO GROWTH TO DATE CULTURE WILL BE HELD FOR 5 DAYS BEFORE ISSUING A FINAL NEGATIVE REPORT   Report Status PENDING   Incomplete   MRSA PCR SCREENING     Status: Normal   Collection Time   02/09/12 12:49 PM      Component Value Range Status Comment   MRSA by PCR NEGATIVE  NEGATIVE Final   CLOSTRIDIUM DIFFICILE BY PCR     Status: Abnormal   Collection Time   02/09/12  3:18 PM      Component Value Range Status Comment   C difficile by pcr POSITIVE (*) NEGATIVE Final     Medications:  Scheduled:     . antiseptic oral rinse  15 mL Mouth Rinse q12n4p  . aspirin  81 mg Oral Daily  . chlorhexidine  15 mL Mouth Rinse BID  . DOPamine      . famotidine  20 mg Oral QAC supper  . insulin aspart  0-4 Units Subcutaneous Q4H  . LORazepam      . vancomycin  500 mg Oral Q6H   And  . metronidazole  500 mg Intravenous Q8H  . multivitamin with minerals  1 tablet  Oral Daily  . phenytoin (DILANTIN) IV  100 mg Intravenous Q8H  . piperacillin-tazobactam (ZOSYN)  IV  3.375 g Intravenous Q8H  . vancomycin  1,250 mg Intravenous Q24H  . DISCONTD: phenytoin  300 mg Oral QHS    Assessment: Pt is a 71 YOM admitted with breakthrough seizure, fever, and chills. On Dilantin PTA. In May 2013, pt was reduced from dilantin 300mg  at hs to 200mg  at hs by primary MD. On 02/06/12 office visit, pt was increased to dilantin 250mg  at hs. Corrected dilantin level on admission was 11 (uncorrected 6.9): level not reflective of recent increase to dilantin 250mg . Per neurology recommendation: IV bolus of 500mg  of dilantin and start dilantin 300mg  at hs. Corrected phenytoin level of 23 on 8/19 is reflective of the IV load and po dose that the pt received on 8/18. Will continue with current dose recommended by neurology. Will change from PO to IV today as patient is post-arrest and re-intubated. Scr 0.92, CrCl ~ 65-12ml/min.  Goal of Therapy:  Phenytoin level 10-20 Prevention of seizures  Plan: 1. Change dilantin 300mg  po at HS to dilantin IV 100mg  three times daily at dose recommended by neurology 2. Dilantin level on 8/23 or earlier if needed 3. Monitor for signs/symptoms of phenytoin toxicity 4. Monitor renal function  Abran Duke, PharmD Clinical Pharmacist Phone: [phone] Pager: [phone] 02/11/2012 2:19 PM

## 2012-02-11 NOTE — Procedures (Signed)
Arterial Catheter Insertion Procedure Note Carolyn Maniscalco [id number] [birth year]  Procedure: Insertion of Arterial Catheter  Indications: Blood pressure monitoring  Procedure Details Consent: Unable to obtain consent because of emergent medical necessity. Time Out: Verified patient identification, verified procedure, site/side was marked, verified correct patient position, special equipment/implants available, medications/allergies/relevent history reviewed, required imaging and test results available.  Performed  Maximum sterile technique was used including antiseptics, cap, gloves, gown, hand hygiene, mask and sheet. Skin prep: Chlorhexidine; local anesthetic administered 20 gauge catheter was inserted into left radial artery using the Seldinger technique.  Evaluation Blood flow good; BP tracing good. Complications: No apparent complications.   Koren Bound 02/11/2012

## 2012-02-11 NOTE — Progress Notes (Signed)
CODE BLUE NOTE  Patient Name: Joseph Copeland   MRN: [id number]   Date of Birth/ Sex: 07/17/1940 , male      Admission Date: 02/09/2012  Attending Provider: Pearla Dubonnet, MD  Primary Diagnosis: SIRS (systemic inflammatory response syndrome)    Indication: Pt was in his usual state of health until approximately before 11 AM, when he was noted to be eyes fixed, pale. Code blue was subsequently called. At the time of arrival on scene, ACLS protocol was underway with intubation, pt already had access, chest compressions.    1050 PEA CPR 1054 asystole CPR 1056 intubated  1057 epi, atropine 1059 bp 182/129 ST 170s  1100 Amio 150 mg bp 162/112 ST 158 1103 ST 144 BP 121/75  Labs sent with rainbow  Technical Description:  - CPR performance duration:  Approximately 20-30 minutes   - Was defibrillation or cardioversion used?  no   - Was external pacer placed? no   - Was patient intubated pre/post CPR? yes     Medications Administered: Y = Yes; Blank = No Amiodarone  Y  Atropine  Y  Calcium    Epinephrine  Y  Lidocaine    Magnesium    Norepinephrine    Phenylephrine    Sodium bicarbonate    Vasopressin      Post CPR evaluation:  - Final Status - Was patient successfully resuscitated ? Yes - What is current rhythm? Sinus tachycardia  - What is current hemodynamic status? 11:03 ST 144 BP 121/75   Miscellaneous Information:  - Labs sent, including: Rainbow-see ordered labs  - Primary team notified?  Will notify   - Family Notified? Yes wife outside of room  - Additional notes/ transfer status: Transferred to 2100    Annett Gula, MD  [phone] 02/11/2012, 11:08 AM

## 2012-02-11 NOTE — Progress Notes (Signed)
Responded to Code Blue:  Introduced myself to pt wife in the hallway and offered pastoral presence and support during pt intervention.  Wife remaining strong and calling children.  Assisted pt wife in walking to 2100 when pt was moved.  Introduced myself to pt dtr's as they arrived and offered support.  Offered refreshments, which dtr accepted.  Pt wife requested we call priest at their [place], [facility] [place] [employer] was also with me and called the church for them.  Assisted family in locating to consult room 2, as there were too many people in the 2100 hall area.  Pt family expressed their thanks for support and care.  Follow-up as needed or requested.

## 2012-02-11 NOTE — Progress Notes (Signed)
Name: Joseph Copeland MRN: [id number] DOB: 1940/11/07    LOS: 2  Referring Provider:  Isidoro Donning ([facility])  Reason for Referral:  Sepsis/Post resp arrest 8/20   PULMONARY / CRITICAL CARE MEDICINE  HPI:  71yo male with hx HTN, seizure disorder, DM presented 8/18 with fevers (102.9), chills and ??seizure,  Per pts wife he has "not been himself" for 1-2 weeks with loss of appetite, diarrhea, chills and generalized weakness.  In ER, u/a was grossly positive for UTI, CXR clear and dilantin level low.  He was given dilantin, abx for UTI and admitted by [facility] to SDU.  However on admission to SDU he was noted to be significantly hypotensive with SBP 70's despite 3L fluids and PCCM consulted for septic shock and ICU tx.   8/20 returned to ICU post aspiration and code .  Vital Signs: Temp:  [97.2 F (36.2 C)-98.5 F (36.9 C)] 98.5 F (36.9 C) (08/20 0425) Pulse Rate:  [96-110] 96  (08/20 0425) Resp:  [22-35] 34  (08/20 1100) BP: (104-162)/(68-113) 162/113 mmHg (08/20 1100) SpO2:  [94 %-100 %] 98 % (08/20 0425) Weight:  [135 lb 9.3 oz (61.5 kg)-142 lb 10.2 oz (64.7 kg)] 142 lb 10.2 oz (64.7 kg) (08/20 0425)  Physical Examination: General:  Acutely ill appearing male,on full vent support Neuro:  Sedated on vent HEENT: ott-> vent Cardiovascular:  s1s2 tachy Lungs:  Coarse rhonchi bilat Abdomen:  Distended, reasonably soft, hypoactive bs, mildly tender diffusely Foley with phimosis Ext: cool, clammy, diaphoretic, no edema , skin breakdown along thoracic spine   Principal Problem:  *SIRS (systemic inflammatory response syndrome) Active Problems:  Seizures, generalized convulsive  HTN (hypertension)  DM (diabetes mellitus), type 2  UTI (lower urinary tract infection)  Abdominal distension  Septic shock  Delirium due to general medical condition   ASSESSMENT AND PLAN  PULMONARY  Lab 02/09/12 2215 02/09/12 2100 02/09/12 2000 02/09/12 1730 02/09/12 1500  PHART -- -- -- -- --  PCO2ART -- -- -- --  --  PO2ART -- -- -- -- --  HCO3 -- -- -- -- --  O2SAT 59.7 58.6 60.8 61.3 51.5   Ventilator Settings:   CXR:   US Abdomen Complete  02/10/2012  *RADIOLOGY REPORT*  Clinical Data:  Abdominal distention, hypertension, diabetes, question cirrhosis, ascites  ULTRASOUND ABDOMEN:  Technique:  Sonography of upper abdominal structures was performed. Imaging degraded by labored breathing.  Comparison:  06/04/2011  Gallbladder:  Gallbladder wall thickening up to 10 mm thick with hypoechoic striations consistent with edema.  No definite calculi or sonographic Cuda's sign.  Common bile duct:  Upper normal caliber 6 mm diameter.  Liver:  Echogenic, question fatty infiltration though this can be seen with cirrhosis and certain infiltrative disorders.  No definite hepatic nodularity for irregular contours identified. Several small hypoechoic foci are seen question small cysts, largest 18 mm diameter. Additional of a 16 mm diameter hypoechoic focus centrally within right lobe, cannot exclude small solid nodule.  No intrahepatic biliary dilatation.  IVC:  Normal appearance  Pancreas:  Body and proximal tail normal appearance, remainder obscured by bowel gas.  Spleen:  Normal appearance, 6.3 cm length.  Right kidney:  10.5 cm length. Normal morphology without mass or hydronephrosis.  Left kidney:  11.0 cm length.  Normal cortical thickness without definite solid mass or hydronephrosis.  Hypoechoic nodule centrally left kidney, 1.2 x 1.7 x 1.4 cm, corresponding to a cyst seen on the previous exam, grossly stable in size but less well visualized on current  study.  Aorta:  Limited visualization due to bowel gas.  Other:  No ascites.  IMPRESSION: Echogenic liver which could represent fatty infiltration or cirrhosis. Small hepatic cysts with an additional 1.6 cm diameter nonspecific hypoechoic focus centrally within the liver. A solid mass is not excluded; follow-up MR imaging with without contrast recommended to evaluate.  Probable small left renal cyst 1.7 cm diameter. Incomplete visualization of pancreas and aorta. Nonspecific gallbladder wall thickening, though no definite calculi are visualized; acalculous cholecystitis not excluded.   Original Report Authenticated By: Lollie Marrow, M.D. ( 02/10/2012 09:57:15 )    Dg Chest Portable 1 View  02/09/2012  *RADIOLOGY REPORT*  Clinical Data: Sepsis.  Central line placement.  PORTABLE CHEST - 1 VIEW  Comparison: 6:47 hours  Findings: Left internal jugular vein center venous catheter placed. Tip is in the upper SVC.  No pneumothorax.  Normal heart size. Clear lungs.  IMPRESSION: Left internal jugular vein central venous catheter placement with its tip in the upper SVC and no pneumothorax.  Original Report Authenticated By: Donavan Burnet, M.D.   Dg Abd 2 Views  02/09/2012  *RADIOLOGY REPORT*  Clinical Data: Abdominal distention for 4 months.  Diarrhea for 2 months.  Fever for 1 week.  Cough and congestion.  ABDOMEN - 2 VIEW  Comparison: Chest film earlier today.  Abdominal pelvic CT of 01/05/2009  Findings: Right-sided decubitus view demonstrates no free intraperitoneal air or significant air fluid levels.  Supine view demonstrates gas within normal caliber large and small bowel loops.  Distal gas.  No pneumatosis.  Phleboliths in the pelvis. No abnormal abdominal calcifications.   No appendicolith.  IMPRESSION: No acute findings.  Original Report Authenticated By: Consuello Bossier, M.D.    ETT2024/09/07  A:  VDRF post vomitus and aspiration leading to respiratory arrest then cardiac arrest with PEA. Coded 03/01/2023 with 6 minutes of cpr.  P:   See orders  CARDIOVASCULAR  Lab 02/10/12 0209 02/09/12 2000 02/09/12 1728 02/09/12 1241 02/09/12 1053 02/09/12 1031  TROPONINI <0.30 -- <0.30 <0.30 <0.30 --  LATICACIDVEN -- 4.9* -- -- -- 4.1*  PROBNP -- -- -- -- -- --   ECG:   Lines: L IJ TLC 8/18>>>              March 01, 2023 rt rad aline>>  A: Septic shock  Hypotension  March 01, 2023 post pea  arrest from respiratory cause  P:  Sepsis protocol  D/C CVPs. D/C Levophed  Cortisol level 70.8 D/C stress steroids Pressors as needed  RENAL  Lab 02/29/12 0512 02/10/12 0209 02/09/12 1242 02/09/12 0606  NA 137 137 -- 130*  K 3.3* 3.1* -- --  CL 103 103 -- 89*  CO2 17* 17* -- 25  BUN 28* 21 -- 17  CREATININE 0.86 0.91 1.04 0.78  CALCIUM 8.3* 7.8* -- 9.8  MG 1.7 -- -- --  PHOS 2.1* -- -- --   Intake/Output      08/19 0701 - 2023/03/01 0700 03-01-2023 0701 - 08/21 0700   P.O. 600    I.V. (mL/kg) 345 (5.3) 150 (2.3)   IV Piggyback 725 300   Total Intake(mL/kg) 1670 (25.8) 450 (7)   Urine (mL/kg/hr) 835 (0.5)    Total Output 835    Net +835 +450         Foley:  8/18>>  A:  Metabolic acidosis P:   Bicarb drip Follow abg/cmet  GASTROINTESTINAL  Lab 02/09/12 1038  AST 49*  ALT 34  ALKPHOS  409*  BILITOT 1.0  PROT 6.0  ALBUMIN 2.6*    A:  Abd distension Diarrhea  Elevated Alk Phos? P:   CDiff PCR positive, on PO vanc and IV flagyl.  Abd ultrasound pending  HEMATOLOGIC  Lab 02/11/12 0512 02/10/12 0209 02/09/12 1530 02/09/12 1242 02/09/12 0606  HGB 11.1* 11.1* -- 11.4* 13.0  HCT 31.6* 31.9* -- 33.0* 36.5*  PLT 90* 86* 78* 86* 148*  INR -- -- 1.34 -- --  APTT -- -- 37 -- --   A:   Thrombocytopenia; due to sepsis ? DIC? HUS? P:  F/u cbc  D/c lovenox  SCD's for now Peripheral smear   INFECTIOUS  Lab 02/11/12 0512 02/10/12 0209 02/09/12 1242 02/09/12 1038 02/09/12 0606  WBC 21.4* 16.7* 9.0 -- 14.9*  PROCALCITON -- -- -- 46.35 --   Cultures: BCx2 8/18>>> Urine 8/18>>>e coli>>ss pip/tazo Cdiff 8/18 >>++ Antibiotics: Rocephin 8/18>>>8/18 Vanc PO 8/18>>> 8/18 Zosyn>> Vanc IV 8/18>>> Flagyl 8/18>>>  A:  UTI Diarrhea  Elevated alk phos and mild abdominal pain worrisome, worrisome for cholecystitis but exam benign P:   Abx as above  Sepsis protocol completed. F/U Ab [place] pending results.   ENDOCRINE  Lab 02/11/12 0801 02/11/12 0427 02/11/12 0032  02/10/12 2008 02/10/12 1603  GLUCAP 118* 137* 102* 165* 135*   A:  DM   P:   ICU hyperglycemia protocol  May need lantus with addition steroids   NEUROLOGIC  A: Post arrest with sz activity P:   Cont anticonvulsants May need ct head in near future  Blanchard Minor ACNP [facility] PCCM Pager [phone] till 3 pm If no answer page [phone] 02/11/2012, 11:39 AM  Family updated extensively bedside.  CC time 90 min.  Patient seen and examined, agree with above note.  I dictated the care and orders written for this patient under my direction.  Koren Bound, M.D. [phone]

## 2012-02-11 NOTE — Progress Notes (Signed)
Location: ICU Patient is intubated and not on any sedation.  Apparently patient had cardiopulmonary arrest. 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 Additionally he requested the sitter to help him sit in bed and get his bath, and the sitter started providing the care and in few minutes patient was pale and had difficulty with breathing, and he started coughing.  He mentioned that there was no seizure like activity recorded or observed.   Patient seen at the bedside,  He is awake, and following commands, RN and his daughters are present in the room.    Patient has been admitted for urosepsis.  Objective: Current vital signs: BP 98/75  Pulse 111  Temp 98.5 F (36.9 C) (Oral)  Resp 28  Ht 5\' 6"  (1.676 m)  Wt 64.7 kg (142 lb 10.2 oz)  BMI 23.02 kg/m2  SpO2 89% Vital signs in last 24 hours: Temp:  [97.2 F (36.2 C)-98.5 F (36.9 C)] 98.5 F (36.9 C) (08/20 0425) Pulse Rate:  [96-111] 111  (08/20 1130) Resp:  [22-35] 28  (08/20 1145) BP: (98-162)/(68-113) 98/75 mmHg (08/20 1145) SpO2:  [89 %-100 %] 89 % (08/20 1130) FiO2 (%):  [50 %-100 %] 50 % (08/20 1200) Weight:  [61.5 kg (135 lb 9.3 oz)-64.7 kg (142 lb 10.2 oz)] 64.7 kg (142 lb 10.2 oz) (08/20 0425)  Intake/Output from previous day: 08/19 0701 - 08/20 0700 In: 1670 [P.O.:600; I.V.:345; IV Piggyback:725] Out: 835 [Urine:835] Intake/Output this shift: Total I/O In: 550 [I.V.:275; IV Piggyback:275] Out: 280 [Urine:280] Nutritional status: NPO   Lungs: CTA Heart; Tachycardic Abdomen: Bowel sounds present  Neurologic Exam:  intubated, follows commands,  Moving all the 4 extremities but the left UE seems slightly weak Sensory intact No rigidity, no clonus No lip  smacking or eye flutter noticed  Lab Results: Results for orders placed during the hospital encounter of 02/09/12 (from the past 48 hour(s))  GLUCOSE, CAPILLARY     Status: Abnormal   Collection Time   02/09/12  2:01 PM      Component Value Range Comment   Glucose-Capillary 202 (*) 70 - 99 mg/dL   CARBOXYHEMOGLOBIN     Status: Abnormal   Collection Time   02/09/12  3:00 PM      Component Value Range Comment   Total hemoglobin 11.4 (*) 13.5 - 18.0 g/dL    O2 Saturation 98.1      Carboxyhemoglobin 1.2  0.5 - 1.5 %    Methemoglobin 1.0  0.0 - 1.5 %   CLOSTRIDIUM DIFFICILE BY PCR     Status: Abnormal   Collection Time   02/09/12  3:18 PM      Component Value Range Comment   C difficile by pcr POSITIVE (*) NEGATIVE   CORTISOL     Status: Normal   Collection Time   02/09/12  3:30 PM      Component Value Range Comment   Cortisol, Plasma 70.8     TYPE AND SCREEN     Status: Normal   Collection Time   02/09/12  3:30 PM      Component Value Range Comment   ABO/RH(D) O POS      Antibody Screen NEG  Sample Expiration 02/12/2012     TECHNOLOGIST SMEAR REVIEW     Status: Normal   Collection Time   02/09/12  3:30 PM      Component Value Range Comment   Tech Review INCREASED BANDS (>20% BANDS)     DIC (DISSEMINATED INTRAVASCULAR COAGULATION) PANEL     Status: Abnormal   Collection Time   02/09/12  3:30 PM      Component Value Range Comment   Prothrombin Time 16.8 (*) 11.6 - 15.2 seconds    INR 1.34  0.00 - 1.49    aPTT 37  24 - 37 seconds    Fibrinogen 758 (*) 204 - 475 mg/dL    D-Dimer, Quant 1.61 (*) 0.00 - 0.48 ug/mL-FEU    Platelets 78 (*) 150 - 400 K/uL    Smear Review NO SCHISTOCYTES SEEN     ABO/RH     Status: Normal   Collection Time   02/09/12  3:30 PM      Component Value Range Comment   ABO/RH(D) O POS     CARDIAC PANEL(CRET KIN+CKTOT+MB+TROPI)     Status: Abnormal   Collection Time   02/09/12  5:28 PM      Component Value Range Comment   Total CK 343 (*) 7 - 232 U/L     CK, MB 6.1 (*) 0.3 - 4.0 ng/mL    Troponin I <0.30  <0.30 ng/mL    Relative Index 1.8  0.0 - 2.5   CARBOXYHEMOGLOBIN     Status: Abnormal   Collection Time   02/09/12  5:30 PM      Component Value Range Comment   Total hemoglobin 11.3 (*) 13.5 - 18.0 g/dL    O2 Saturation 09.6      Carboxyhemoglobin 1.4  0.5 - 1.5 %    Methemoglobin 1.0  0.0 - 1.5 %   LACTIC ACID, PLASMA     Status: Abnormal   Collection Time   02/09/12  8:00 PM      Component Value Range Comment   Lactic Acid, Venous 4.9 (*) 0.5 - 2.2 mmol/L   CARBOXYHEMOGLOBIN     Status: Abnormal   Collection Time   02/09/12  8:00 PM      Component Value Range Comment   Total hemoglobin 10.9 (*) 13.5 - 18.0 g/dL    O2 Saturation 04.5      Carboxyhemoglobin 1.0  0.5 - 1.5 %    Methemoglobin 1.2  0.0 - 1.5 %   GLUCOSE, CAPILLARY     Status: Abnormal   Collection Time   02/09/12  8:00 PM      Component Value Range Comment   Glucose-Capillary 242 (*) 70 - 99 mg/dL   CARBOXYHEMOGLOBIN     Status: Abnormal   Collection Time   02/09/12  9:00 PM      Component Value Range Comment   Total hemoglobin 10.4 (*) 13.5 - 18.0 g/dL    O2 Saturation 40.9      Carboxyhemoglobin 1.2  0.5 - 1.5 %    Methemoglobin 1.0  0.0 - 1.5 %   CARBOXYHEMOGLOBIN     Status: Abnormal   Collection Time   02/09/12 10:15 PM      Component Value Range Comment   Total hemoglobin 11.3 (*) 13.5 - 18.0 g/dL    O2 Saturation 81.1      Carboxyhemoglobin 1.0  0.5 - 1.5 %    Methemoglobin 1.1  0.0 - 1.5 %   GLUCOSE, CAPILLARY  Status: Abnormal   Collection Time   02/09/12 11:32 PM      Component Value Range Comment   Glucose-Capillary 188 (*) 70 - 99 mg/dL   CARDIAC PANEL(CRET KIN+CKTOT+MB+TROPI)     Status: Abnormal   Collection Time   02/10/12  2:09 AM      Component Value Range Comment   Total CK 306 (*) 7 - 232 U/L    CK, MB 10.2 (*) 0.3 - 4.0 ng/mL CRITICAL VALUE NOTED.  VALUE IS CONSISTENT WITH PREVIOUSLY REPORTED AND CALLED VALUE.   Troponin I  <0.30  <0.30 ng/mL    Relative Index 3.3 (*) 0.0 - 2.5   BASIC METABOLIC PANEL     Status: Abnormal   Collection Time   02/10/12  2:09 AM      Component Value Range Comment   Sodium 137  135 - 145 mEq/L    Potassium 3.1 (*) 3.5 - 5.1 mEq/L    Chloride 103  96 - 112 mEq/L    CO2 17 (*) 19 - 32 mEq/L    Glucose, Bld 169 (*) 70 - 99 mg/dL    BUN 21  6 - 23 mg/dL    Creatinine, Ser 1.61  0.50 - 1.35 mg/dL    Calcium 7.8 (*) 8.4 - 10.5 mg/dL    GFR calc non Af Amer 84 (*) >90 mL/min    GFR calc Af Amer >90  >90 mL/min   CBC     Status: Abnormal   Collection Time   02/10/12  2:09 AM      Component Value Range Comment   WBC 16.7 (*) 4.0 - 10.5 K/uL    RBC 3.40 (*) 4.22 - 5.81 MIL/uL    Hemoglobin 11.1 (*) 13.0 - 17.0 g/dL    HCT 09.6 (*) 04.5 - 52.0 %    MCV 93.8  78.0 - 100.0 fL    MCH 32.6  26.0 - 34.0 pg    MCHC 34.8  30.0 - 36.0 g/dL    RDW 40.9 (*) 81.1 - 15.5 %    Platelets 86 (*) 150 - 400 K/uL CONSISTENT WITH PREVIOUS RESULT  TSH     Status: Normal   Collection Time   02/10/12  2:09 AM      Component Value Range Comment   TSH 1.052  0.350 - 4.500 uIU/mL   GLUCOSE, CAPILLARY     Status: Abnormal   Collection Time   02/10/12  4:10 AM      Component Value Range Comment   Glucose-Capillary 144 (*) 70 - 99 mg/dL   GLUCOSE, CAPILLARY     Status: Abnormal   Collection Time   02/10/12  7:48 AM      Component Value Range Comment   Glucose-Capillary 111 (*) 70 - 99 mg/dL   PHENYTOIN LEVEL, TOTAL     Status: Normal   Collection Time   02/10/12  8:30 AM      Component Value Range Comment   Phenytoin Lvl 14.3  10.0 - 20.0 ug/mL   GLUCOSE, CAPILLARY     Status: Normal   Collection Time   02/10/12 11:48 AM      Component Value Range Comment   Glucose-Capillary 96  70 - 99 mg/dL   GLUCOSE, CAPILLARY     Status: Abnormal   Collection Time   02/10/12  4:03 PM      Component Value Range Comment   Glucose-Capillary 135 (*) 70 - 99 mg/dL    Comment 1 Notify  RN     GLUCOSE, CAPILLARY      Status: Abnormal   Collection Time   02/10/12  8:08 PM      Component Value Range Comment   Glucose-Capillary 165 (*) 70 - 99 mg/dL   GLUCOSE, CAPILLARY     Status: Abnormal   Collection Time   02/11/12 12:32 AM      Component Value Range Comment   Glucose-Capillary 102 (*) 70 - 99 mg/dL   GLUCOSE, CAPILLARY     Status: Abnormal   Collection Time   02/11/12  4:27 AM      Component Value Range Comment   Glucose-Capillary 137 (*) 70 - 99 mg/dL   BASIC METABOLIC PANEL     Status: Abnormal   Collection Time   02/11/12  5:12 AM      Component Value Range Comment   Sodium 137  135 - 145 mEq/L    Potassium 3.3 (*) 3.5 - 5.1 mEq/L    Chloride 103  96 - 112 mEq/L    CO2 17 (*) 19 - 32 mEq/L    Glucose, Bld 153 (*) 70 - 99 mg/dL    BUN 28 (*) 6 - 23 mg/dL    Creatinine, Ser 4.54  0.50 - 1.35 mg/dL    Calcium 8.3 (*) 8.4 - 10.5 mg/dL    GFR calc non Af Amer 86 (*) >90 mL/min    GFR calc Af Amer >90  >90 mL/min   CBC     Status: Abnormal   Collection Time   02/11/12  5:12 AM      Component Value Range Comment   WBC 21.4 (*) 4.0 - 10.5 K/uL    RBC 3.39 (*) 4.22 - 5.81 MIL/uL    Hemoglobin 11.1 (*) 13.0 - 17.0 g/dL    HCT 09.8 (*) 11.9 - 52.0 %    MCV 93.2  78.0 - 100.0 fL    MCH 32.7  26.0 - 34.0 pg    MCHC 35.1  30.0 - 36.0 g/dL    RDW 14.7 (*) 82.9 - 15.5 %    Platelets 90 (*) 150 - 400 K/uL CONSISTENT WITH PREVIOUS RESULT  MAGNESIUM     Status: Normal   Collection Time   02/11/12  5:12 AM      Component Value Range Comment   Magnesium 1.7  1.5 - 2.5 mg/dL   PHOSPHORUS     Status: Abnormal   Collection Time   02/11/12  5:12 AM      Component Value Range Comment   Phosphorus 2.1 (*) 2.3 - 4.6 mg/dL   GLUCOSE, CAPILLARY     Status: Abnormal   Collection Time   02/11/12  8:01 AM      Component Value Range Comment   Glucose-Capillary 118 (*) 70 - 99 mg/dL   CBC     Status: Abnormal   Collection Time   02/11/12 11:40 AM      Component Value Range Comment   WBC 31.6 (*) 4.0 -  10.5 K/uL    RBC 3.30 (*) 4.22 - 5.81 MIL/uL    Hemoglobin 10.7 (*) 13.0 - 17.0 g/dL    HCT 56.2 (*) 13.0 - 52.0 %    MCV 96.4  78.0 - 100.0 fL    MCH 32.4  26.0 - 34.0 pg    MCHC 33.6  30.0 - 36.0 g/dL    RDW 86.5 (*) 78.4 - 15.5 %    Platelets 100 (*) 150 - 400 K/uL  CONSISTENT WITH PREVIOUS RESULT  COMPREHENSIVE METABOLIC PANEL     Status: Abnormal   Collection Time   02/11/12 11:40 AM      Component Value Range Comment   Sodium 141  135 - 145 mEq/L    Potassium 3.4 (*) 3.5 - 5.1 mEq/L    Chloride 104  96 - 112 mEq/L    CO2 13 (*) 19 - 32 mEq/L    Glucose, Bld 195 (*) 70 - 99 mg/dL    BUN 26 (*) 6 - 23 mg/dL    Creatinine, Ser 4.54  0.50 - 1.35 mg/dL    Calcium 8.3 (*) 8.4 - 10.5 mg/dL    Total Protein 6.2  6.0 - 8.3 g/dL    Albumin 2.4 (*) 3.5 - 5.2 g/dL    AST 57 (*) 0 - 37 U/L    ALT 49  0 - 53 U/L    Alkaline Phosphatase 116  39 - 117 U/L    Total Bilirubin 0.5  0.3 - 1.2 mg/dL    GFR calc non Af Amer 83 (*) >90 mL/min    GFR calc Af Amer >90  >90 mL/min   POCT I-STAT 3, BLOOD GAS (G3+)     Status: Abnormal   Collection Time   02/11/12 11:50 AM      Component Value Range Comment   pH, Arterial 7.200 (*) 7.350 - 7.450    pCO2 arterial 27.0 (*) 35.0 - 45.0 mmHg    pO2, Arterial 228.0 (*) 80.0 - 100.0 mmHg    Bicarbonate 10.5 (*) 20.0 - 24.0 mEq/L    TCO2 11  0 - 100 mmol/L    O2 Saturation 100.0      Acid-base deficit 16.0 (*) 0.0 - 2.0 mmol/L    Collection site ARTERIAL LINE      Drawn by Operator      Sample type ARTERIAL     GLUCOSE, CAPILLARY     Status: Abnormal   Collection Time   02/11/12 12:17 PM      Component Value Range Comment   Glucose-Capillary 170 (*) 70 - 99 mg/dL     Recent Results (from the past 240 hour(s))  URINE CULTURE     Status: Normal   Collection Time   02/09/12  8:52 AM      Component Value Range Status Comment   Specimen Description URINE, CLEAN CATCH   Final    Special Requests ADDED ON 02/09/12 AT 1050   Final    Culture  Setup  Time 02/09/2012 16:54   Final    Colony Count >=100,000 COLONIES/ML   Final    Culture ESCHERICHIA COLI   Final    Report Status 02/11/2012 FINAL   Final    Organism ID, Bacteria ESCHERICHIA COLI   Final   CULTURE, BLOOD (ROUTINE X 2)     Status: Normal (Preliminary result)   Collection Time   02/09/12 10:41 AM      Component Value Range Status Comment   Specimen Description BLOOD LEFT ARM   Final    Special Requests BOTTLES DRAWN AEROBIC AND ANAEROBIC 10CC   Final    Culture  Setup Time 02/09/2012 16:55   Final    Culture     Final    Value:        BLOOD CULTURE RECEIVED NO GROWTH TO DATE CULTURE WILL BE HELD FOR 5 DAYS BEFORE ISSUING A FINAL NEGATIVE REPORT   Report Status PENDING   Incomplete   CULTURE,  BLOOD (ROUTINE X 2)     Status: Normal (Preliminary result)   Collection Time   02/09/12 10:46 AM      Component Value Range Status Comment   Specimen Description BLOOD LEFT HAND   Final    Special Requests BOTTLES DRAWN AEROBIC AND ANAEROBIC 10CC   Final    Culture  Setup Time 02/09/2012 16:55   Final    Culture     Final    Value:        BLOOD CULTURE RECEIVED NO GROWTH TO DATE CULTURE WILL BE HELD FOR 5 DAYS BEFORE ISSUING A FINAL NEGATIVE REPORT   Report Status PENDING   Incomplete   MRSA PCR SCREENING     Status: Normal   Collection Time   02/09/12 12:49 PM      Component Value Range Status Comment   MRSA by PCR NEGATIVE  NEGATIVE Final   CLOSTRIDIUM DIFFICILE BY PCR     Status: Abnormal   Collection Time   02/09/12  3:18 PM      Component Value Range Status Comment   C difficile by pcr POSITIVE (*) NEGATIVE Final     Lipid Panel No results found for this basename: CHOL,TRIG,HDL,CHOLHDL,VLDL,LDLCALC in the last 72 hours  Studies/Results: US Abdomen Complete  02/10/2012  *RADIOLOGY REPORT*  Clinical Data:  Abdominal distention, hypertension, diabetes, question cirrhosis, ascites  ULTRASOUND ABDOMEN:  Technique:  Sonography of upper abdominal structures was performed.  Imaging degraded by labored breathing.  Comparison:  06/04/2011  Gallbladder:  Gallbladder wall thickening up to 10 mm thick with hypoechoic striations consistent with edema.  No definite calculi or sonographic Dattilio's sign.  Common bile duct:  Upper normal caliber 6 mm diameter.  Liver:  Echogenic, question fatty infiltration though this can be seen with cirrhosis and certain infiltrative disorders.  No definite hepatic nodularity for irregular contours identified. Several small hypoechoic foci are seen question small cysts, largest 18 mm diameter. Additional of a 16 mm diameter hypoechoic focus centrally within right lobe, cannot exclude small solid nodule.  No intrahepatic biliary dilatation.  IVC:  Normal appearance  Pancreas:  Body and proximal tail normal appearance, remainder obscured by bowel gas.  Spleen:  Normal appearance, 6.3 cm length.  Right kidney:  10.5 cm length. Normal morphology without mass or hydronephrosis.  Left kidney:  11.0 cm length.  Normal cortical thickness without definite solid mass or hydronephrosis.  Hypoechoic nodule centrally left kidney, 1.2 x 1.7 x 1.4 cm, corresponding to a cyst seen on the previous exam, grossly stable in size but less well visualized on current study.  Aorta:  Limited visualization due to bowel gas.  Other:  No ascites.  IMPRESSION: Echogenic liver which could represent fatty infiltration or cirrhosis. Small hepatic cysts with an additional 1.6 cm diameter nonspecific hypoechoic focus centrally within the liver. A solid mass is not excluded; follow-up MR imaging with without contrast recommended to evaluate. Probable small left renal cyst 1.7 cm diameter. Incomplete visualization of pancreas and aorta. Nonspecific gallbladder wall thickening, though no definite calculi are visualized; acalculous cholecystitis not excluded.   Original Report Authenticated By: Lollie Marrow, M.D. ( 02/10/2012 09:57:15 )    Dg Chest Port 1 View  02/11/2012  *RADIOLOGY REPORT*   Clinical Data: Intubation.  PORTABLE CHEST - 1 VIEW  Comparison: 02/09/2012  Findings: An endotracheal tube is in place, with tip 2.9 cm above the carina.  A left internal jugular line has been placed.  Its tip is oriented transversely  and projects over the SVC.  Low lung volumes noted with mild cardiomegaly and thoracic spondylosis.  Linear subsegmental atelectasis noted at the right lung base.  IMPRESSION:  1.  ET tube tip 2.9 cm above the carina. 2.  Left IJ line tip projects over the SVC.  No pneumothorax observed. 3.  Low lung volumes. 4.  Mild atelectasis at the right lung base.   Original Report Authenticated By: Dellia Cloud, M.D.    Dg Chest Portable 1 View  02/09/2012  *RADIOLOGY REPORT*  Clinical Data: Sepsis.  Central line placement.  PORTABLE CHEST - 1 VIEW  Comparison: 6:47 hours  Findings: Left internal jugular vein center venous catheter placed. Tip is in the upper SVC.  No pneumothorax.  Normal heart size. Clear lungs.  IMPRESSION: Left internal jugular vein central venous catheter placement with its tip in the upper SVC and no pneumothorax.  Original Report Authenticated By: Donavan Burnet, M.D.    Medications:  Continuous:   . sodium chloride 75 mL/hr at 02/11/12 1310  . phenylephrine (NEO-SYNEPHRINE) Adult infusion    .  sodium bicarbonate infusion 1000 mL 50 mL/hr at 02/11/12 1310  . DISCONTD: sodium chloride 20 mL/hr at 02/10/12 0500  . DISCONTD: 0.9 % NaCl with KCl 20 mEq / L 75 mL/hr (02/11/12 0906)  . DISCONTD: dextrose      Assessment/Plan:   Patient with Urosepsis. Stable from Seizure perspective. Dilantin was therapeutic on admission The corrected Dilantin was 12   And then he had a loading dose of 500 mg of Dilantin and the level has been significant,  He has been awake, alert and following commands all these days that I have been following him.   Less likely to be a seizure, because after the code when I saw him he was still awake and following  commands.  Check Dilantin level Check albumin level  Estalene Bergey V-P Eilleen Kempf., MD., Ph.D.,MS 02/11/2012 2:20 PM

## 2012-02-11 NOTE — Progress Notes (Signed)
PT Cancellation/D/C Note  Treatment cancelled today due to medical issues with patient which prohibited therapy.  Patient transferred to 2100 after code blue with intubation.  Will need reorder of PT if and when appropriate.  Thanks. D/C PT.    Barb Merino 02/11/2012, 12:38 PM  [facility] [phone] (pager)

## 2012-02-11 NOTE — Progress Notes (Signed)
Subjective: On arriving to Joseph Copeland's room this morning, he is somewhat agitated and confused.  He recognizes me but doesn't understand why he has had to change rooms and thinks that he has been moved to the wrong room.  Clearly not understanding why he was moved from the intensive care unit et [facility].  He states that he is thirsty, but no focal pains.  Nurse reports that he is having some mucoid stool but not large amounts.  Escherichia coli cultured from urine.  Apparently also has a positive stool C. difficile antigen.  No further seizure activity  Objective: Weight change: 0.3 kg (10.6 oz)  Intake/Output Summary (Last 24 hours) at 02/11/12 0757 Last data filed at 02/11/12 0600  Gross per 24 hour  Intake   1670 ml  Output    835 ml  Net    835 ml   Filed Vitals:   02/10/12 1955 02/10/12 2000 02/10/12 2313 02/11/12 0425  BP: 118/85 111/77 120/83 129/86  Pulse: 104 103 101 96  Temp: 98.3 F (36.8 C)  98.5 F (36.9 C) 98.5 F (36.9 C)  TempSrc: Oral  Axillary Oral  Resp: 24 33 24 22  Height:      Weight:    64.7 kg (142 lb 10.2 oz)  SpO2: 99% 99% 98% 98%   General Appearance: Alert but confused and obviously distressed.  Conversive.  Thinks that his wife is down the hall in the kitchen Head: Normocephalic, without obvious abnormality, atraumatic.  Oropharynx somewhat dry.  No thrush Neck: Supple, symmetrical Lungs: Clear to auscultation bilaterally, respirations unlabored Heart: Regular rate and rhythm, S1 and S2 normal, no murmur, rub or gallop Abdomen: Soft, non-tender, bowel sounds active all four quadrants, no masses, no organomegaly Extremities: Extremities normal, atraumatic, no cyanosis or edema Pulses: 2+ and symmetric all extremities Skin: Skin color, texture, turgor normal, no rashes or lesions Neuro: CNII-XII intact. Normal strength, sensation and reflexes throughout   Lab Results:  [facility] 02/11/12 0512 02/10/12 0209  NA 137 137  K 3.3* 3.1*  CL 103 103    CO2 17* 17*  GLUCOSE 153* 169*  BUN 28* 21  CREATININE 0.86 0.91  CALCIUM 8.3* 7.8*  MG 1.7 --  PHOS 2.1* --    Basename 02/09/12 1038  AST 49*  ALT 34  ALKPHOS 409*  BILITOT 1.0  PROT 6.0  ALBUMIN 2.6*   No results found for this basename: LIPASE:2,AMYLASE:2 in the last 72 hours  Basename 02/11/12 0512 02/10/12 0209 02/09/12 0606  WBC 21.4* 16.7* --  NEUTROABS -- -- 12.3*  HGB 11.1* 11.1* --  HCT 31.6* 31.9* --  MCV 93.2 93.8 --  PLT 90* 86* --    Basename 02/10/12 0209 02/09/12 1728 02/09/12 1241  CKTOTAL 306* 343* 430*  CKMB 10.2* 6.1* 5.4*  CKMBINDEX -- -- --  TROPONINI <0.30 <0.30 <0.30   No components found with this basename: POCBNP:3  Basename 02/09/12 1530  DDIMER 4.99*    Basename 02/09/12 1242  HGBA1C 6.2*   No results found for this basename: CHOL:2,HDL:2,LDLCALC:2,TRIG:2,CHOLHDL:2,LDLDIRECT:2 in the last 72 hours  Basename 02/10/12 0209  TSH 1.052  T4TOTAL --  T3FREE --  THYROIDAB --   No results found for this basename: VITAMINB12:2,FOLATE:2,FERRITIN:2,TIBC:2,IRON:2,RETICCTPCT:2 in the last 72 hours  Studies/Results: US Abdomen Complete  02/10/2012  *RADIOLOGY REPORT*  Clinical Data:  Abdominal distention, hypertension, diabetes, question cirrhosis, ascites  ULTRASOUND ABDOMEN:  Technique:  Sonography of upper abdominal structures was performed. Imaging degraded by labored breathing.  Comparison:  06/04/2011  Gallbladder:  Gallbladder wall thickening up to 10 mm thick with hypoechoic striations consistent with edema.  No definite calculi or sonographic Blaney's sign.  Common bile duct:  Upper normal caliber 6 mm diameter.  Liver:  Echogenic, question fatty infiltration though this can be seen with cirrhosis and certain infiltrative disorders.  No definite hepatic nodularity for irregular contours identified. Several small hypoechoic foci are seen question small cysts, largest 18 mm diameter. Additional of a 16 mm diameter hypoechoic focus  centrally within right lobe, cannot exclude small solid nodule.  No intrahepatic biliary dilatation.  IVC:  Normal appearance  Pancreas:  Body and proximal tail normal appearance, remainder obscured by bowel gas.  Spleen:  Normal appearance, 6.3 cm length.  Right kidney:  10.5 cm length. Normal morphology without mass or hydronephrosis.  Left kidney:  11.0 cm length.  Normal cortical thickness without definite solid mass or hydronephrosis.  Hypoechoic nodule centrally left kidney, 1.2 x 1.7 x 1.4 cm, corresponding to a cyst seen on the previous exam, grossly stable in size but less well visualized on current study.  Aorta:  Limited visualization due to bowel gas.  Other:  No ascites.  IMPRESSION: Echogenic liver which could represent fatty infiltration or cirrhosis. Small hepatic cysts with an additional 1.6 cm diameter nonspecific hypoechoic focus centrally within the liver. A solid mass is not excluded; follow-up MR imaging with without contrast recommended to evaluate. Probable small left renal cyst 1.7 cm diameter. Incomplete visualization of pancreas and aorta. Nonspecific gallbladder wall thickening, though no definite calculi are visualized; acalculous cholecystitis not excluded.   Original Report Authenticated By: Lollie Marrow, M.D. ( 02/10/2012 09:57:15 )    Dg Chest Portable 1 View  02/09/2012  *RADIOLOGY REPORT*  Clinical Data: Sepsis.  Central line placement.  PORTABLE CHEST - 1 VIEW  Comparison: 6:47 hours  Findings: Left internal jugular vein center venous catheter placed. Tip is in the upper SVC.  No pneumothorax.  Normal heart size. Clear lungs.  IMPRESSION: Left internal jugular vein central venous catheter placement with its tip in the upper SVC and no pneumothorax.  Original Report Authenticated By: Donavan Burnet, M.D.   Dg Abd 2 Views  02/09/2012  *RADIOLOGY REPORT*  Clinical Data: Abdominal distention for 4 months.  Diarrhea for 2 months.  Fever for 1 week.  Cough and congestion.   ABDOMEN - 2 VIEW  Comparison: Chest film earlier today.  Abdominal pelvic CT of 01/05/2009  Findings: Right-sided decubitus view demonstrates no free intraperitoneal air or significant air fluid levels.  Supine view demonstrates gas within normal caliber large and small bowel loops.  Distal gas.  No pneumatosis.  Phleboliths in the pelvis. No abnormal abdominal calcifications.   No appendicolith.  IMPRESSION: No acute findings.  Original Report Authenticated By: Consuello Bossier, M.D.   Medications: Scheduled Meds:   . antiseptic oral rinse  15 mL Mouth Rinse q12n4p  . aspirin  81 mg Oral Daily  . chlorhexidine  15 mL Mouth Rinse BID  . famotidine  20 mg Oral QAC supper  . insulin aspart  0-4 Units Subcutaneous Q4H  . vancomycin  500 mg Oral Q6H   And  . metronidazole  500 mg Intravenous Q8H  . multivitamin with minerals  1 tablet Oral Daily  . phenytoin  300 mg Oral QHS  . piperacillin-tazobactam (ZOSYN)  IV  3.375 g Intravenous Q8H  . potassium chloride  10 mEq Intravenous Q1 Hr x 4  . vancomycin  1,250 mg Intravenous Q24H  . DISCONTD: famotidine (PEPCID) IV  20 mg Intravenous Q24H  . DISCONTD: hydrocortisone sodium succinate  50 mg Intravenous Q6H  . DISCONTD: sodium chloride  500 mL Intravenous Once   Continuous Infusions:   . 0.9 % NaCl with KCl 20 mEq / L    . DISCONTD: sodium chloride 20 mL/hr at 02/10/12 0500  . DISCONTD: dextrose    . DISCONTD: vasopressin (PITRESSIN) infusion - *FOR SHOCK*     PRN Meds:.acetaminophen, acetaminophen, alum & mag hydroxide-simeth, haloperidol lactate, LORazepam, ondansetron (ZOFRAN) IV, ondansetron, DISCONTD: dextrose, DISCONTD: DOBUTamine, DISCONTD: norepinephrine (LEVOPHED) Adult infusion, DISCONTD: sodium chloride, DISCONTD: vasopressin (PITRESSIN) infusion - *FOR SHOCK*  Assessment/Plan: Patient Active Problem List   Diagnosis Date Noted  . Delirium due to general medical condition - treat with Ativan and Haldol for now and provide sitter  in the room  02/11/2012  . SIRS (systemic inflammatory response syndrome) - white count still elevated.  Continue current antibiotics and therapy for C. difficile  02/09/2012  . UTI (lower urinary tract infection) - as above - narrow down antibiotics when sensitivities available  02/09/2012  . Abdominal distension - improved  02/09/2012  . Septic shock - resolved  02/09/2012  . Seizures, generalized convulsive - controlled  06/03/2011  . HTN (hypertension) 06/03/2011  . DM (diabetes mellitus), type 2 - controlled continue CBGs and sliding scale insulin  06/03/2011  . Anemia 06/03/2011  . Thrombocytopenia C. difficile colitis  - continue enteric isolation precautions and vancomycin and Flagyl  06/03/2011     LOS: 2 days   Kentrell Hallahan NEVILL 02/11/2012, 7:57 AM

## 2012-02-11 NOTE — Procedures (Signed)
Central Venous Catheter Insertion Procedure Note Joseph Copeland [id number] [birth year]  Procedure: Insertion of Central Venous Catheter Indications: Assessment of intravascular volume  Procedure Details Consent: Unable to obtain consent because of emergent medical necessity. Time Out: Verified patient identification, verified procedure, site/side was marked, verified correct patient position, special equipment/implants available, medications/allergies/relevent history reviewed, required imaging and test results available.  Performed  Maximum sterile technique was used including antiseptics, cap, gloves, gown, hand hygiene, mask and sheet. Skin prep: Chlorhexidine; local anesthetic administered A antimicrobial bonded/coated triple lumen catheter was placed in the right internal jugular vein using the Seldinger technique.  Evaluation Blood flow good Complications: No apparent complications Patient did tolerate procedure well. Chest X-ray ordered to verify placement.  CXR: pending.  U/S used in placement.  YACOUB,WESAM 02/11/2012, 1:28 PM

## 2012-02-11 NOTE — Progress Notes (Signed)
Spoke with Dr. Molli Knock regarding radiology report suggestion of withdrawing ETT by 2cm. MD does not wish to make any further changes to tube placement at this time. ETT 27cm at lip.

## 2012-02-12 ENCOUNTER — Inpatient Hospital Stay (HOSPITAL_COMMUNITY): Payer: Medicare Other

## 2012-02-12 DIAGNOSIS — G931 Anoxic brain damage, not elsewhere classified: Secondary | ICD-10-CM

## 2012-02-12 LAB — BASIC METABOLIC PANEL
BUN: 26 mg/dL — ABNORMAL HIGH (ref 6–23)
BUN: 19 mg/dL (ref 6–23)
CO2: 29 mEq/L (ref 19–32)
Chloride: 107 mEq/L (ref 96–112)
GFR calc Af Amer: >90 mL/min (ref >90)
Glucose, Bld: 146 mg/dL — ABNORMAL HIGH (ref 70–99)
Glucose, Bld: 161 mg/dL — ABNORMAL HIGH (ref 70–99)
Potassium: 2.6 mEq/L — CL (ref 3.5–5.1)
Potassium: 2.7 mEq/L — CL (ref 3.5–5.1)
Sodium: 148 mEq/L — ABNORMAL HIGH (ref 135–145)

## 2012-02-12 LAB — CBC WITH DIFFERENTIAL/PLATELET
Basophils Relative: 0 % (ref 0–1)
Hemoglobin: 10.3 g/dL — ABNORMAL LOW (ref 13.0–17.0)
Lymphs Abs: 1 10*3/uL (ref 0.7–4.0)
Monocytes Relative: 12 % (ref 3–12)
Neutro Abs: 10.6 10*3/uL — ABNORMAL HIGH (ref 1.7–7.7)
Neutrophils Relative %: 80 % — ABNORMAL HIGH (ref 43–77)
RBC: 3.13 MIL/uL — ABNORMAL LOW (ref 4.22–5.81)
WBC: 13.3 10*3/uL — ABNORMAL HIGH (ref 4.0–10.5)

## 2012-02-12 LAB — GLUCOSE, CAPILLARY
Glucose-Capillary: 179 mg/dL — ABNORMAL HIGH (ref 70–99)
Glucose-Capillary: 151 mg/dL — ABNORMAL HIGH (ref 70–99)
Glucose-Capillary: 160 mg/dL — ABNORMAL HIGH (ref 70–99)

## 2012-02-12 LAB — MAGNESIUM: Magnesium: 1.8 mg/dL (ref 1.5–2.5)

## 2012-02-12 MED ORDER — SODIUM PHOSPHATE 3 MMOLE/ML IV SOLN
30.0000 mmol | Freq: Once | INTRAVENOUS | Status: AC
  Administered 2012-02-12: 30 mmol via INTRAVENOUS
  Filled 2012-02-12: qty 10

## 2012-02-12 MED ORDER — POTASSIUM CHLORIDE 20 MEQ/15ML (10%) PO LIQD
40.0000 meq | ORAL | Status: AC
  Administered 2012-02-12 (×3): 40 meq
  Filled 2012-02-12 (×3): qty 30

## 2012-02-12 MED ORDER — FENTANYL CITRATE 0.05 MG/ML IJ SOLN: 12.5000 ug | INTRAMUSCULAR | Status: DC | PRN

## 2012-02-12 MED ORDER — DEXTROSE 50 % IV SOLN
INTRAVENOUS | Status: AC
  Filled 2012-02-12: qty 50

## 2012-02-12 MED ORDER — POTASSIUM PHOSPHATE DIBASIC 3 MMOLE/ML IV SOLN
30.0000 mmol | Freq: Once | INTRAVENOUS | Status: AC
  Administered 2012-02-12: 30 mmol via INTRAVENOUS
  Filled 2012-02-12: qty 10

## 2012-02-12 MED ORDER — POTASSIUM CHLORIDE 10 MEQ/50ML IV SOLN
10.0000 meq | INTRAVENOUS | Status: AC
  Administered 2012-02-12 – 2012-02-13 (×6): 10 meq via INTRAVENOUS
  Filled 2012-02-12: qty 300

## 2012-02-12 MED ORDER — FUROSEMIDE 10 MG/ML IJ SOLN
40.0000 mg | Freq: Three times a day (TID) | INTRAMUSCULAR | Status: AC
  Administered 2012-02-12 (×2): 40 mg via INTRAVENOUS
  Filled 2012-02-12 (×2): qty 4

## 2012-02-12 MED ORDER — MAGNESIUM SULFATE 40 MG/ML IJ SOLN
2.0000 g | Freq: Once | INTRAMUSCULAR | Status: DC
  Filled 2012-02-12: qty 50

## 2012-02-12 MED ORDER — MAGNESIUM SULFATE 40 MG/ML IJ SOLN
2.0000 g | Freq: Once | INTRAMUSCULAR | Status: AC
  Administered 2012-02-12: 2 g via INTRAVENOUS
  Filled 2012-02-12: qty 50

## 2012-02-12 MED ORDER — POTASSIUM CHLORIDE 20 MEQ/15ML (10%) PO LIQD
ORAL | Status: AC
  Filled 2012-02-12: qty 30

## 2012-02-12 MED ORDER — DEXTROSE 50 % IV SOLN: 25.0000 mL | Freq: Once | INTRAVENOUS | Status: AC | PRN

## 2012-02-12 NOTE — Progress Notes (Signed)
02/12/2012-1705- Pt extubated to 4lpm cannula.  HR99, BP 109/59, sats 100%, RR 32. Pt vocalizing post extubation. S Kaiana Marion rrt, rcp

## 2012-02-12 NOTE — Procedures (Signed)
Arterial Catheter Insertion Procedure Note Joseph Copeland [id number] 18-May-1941  Procedure: Insertion of Arterial Catheter  Indications: Blood pressure monitoring and Frequent blood sampling  Procedure Details Consent: Unable to obtain consent because of altered level of consciousness. Time Out: Verified patient identification, verified procedure, site/side was marked, verified correct patient position, special equipment/implants available, medications/allergies/relevent history reviewed, required imaging and test results available.  Performed  Maximum sterile technique was used including antiseptics, cap, gloves, gown, hand hygiene, mask and sheet. Skin prep: Chlorhexidine; local anesthetic administered 20 gauge catheter was inserted into left radial artery using the Seldinger technique.  Evaluation Blood flow good; BP tracing good. Complications: No apparent complications.   Joseph Copeland, Joseph Copeland 02/12/2012

## 2012-02-12 NOTE — Progress Notes (Signed)
PHARMACY - CRITICAL CARE PROGRESS NOTE  Pharmacy Consult for Phenytoin Indication: Breakthrough Seizures   No Known Allergies  Patient Measurements: Height: 5\' 6"  (167.6 cm) Weight: 142 lb 3.2 oz (64.5 kg) IBW/kg (Calculated) : 63.8   Vital Signs: Temp: 98.8 F (37.1 C) (08/21 0738) Temp src: Oral (08/21 0738) BP: 88/61 mmHg (08/21 0800) Pulse Rate: 93  (08/21 0800) Intake/Output from previous day: 08/20 0701 - 08/21 0700 In: 3547.5 [I.V.:2630; NG/GT:360; IV Piggyback:557.5] Out: 1355 [Urine:1295; Emesis/NG output:60] Intake/Output from this shift: Total I/O In: 470.5 [I.V.:125; NG/GT:40; IV Piggyback:305.5] Out: -  Vent settings for last 24 hours: Vent Mode:  [-] PRVC FiO2 (%):  [40 %-100 %] 40 % Set Rate:  [20 bmp] 20 bmp Vt Set:  [500 mL] 500 mL PEEP:  [5 cmH20-12 cmH20] 5 cmH20 Plateau Pressure:  [21 cmH20-28 cmH20] 22 cmH20  Labs:  Basename 02/12/12 0340 02/11/12 1450 02/11/12 1140 02/11/12 0512 02/09/12 1530  WBC 13.3* -- 31.6* 21.4* --  HGB 10.3* -- 10.7* 11.1* --  HCT 28.6* -- 31.8* 31.6* --  PLT 94* -- 100* 90* --  APTT -- -- -- -- 37  INR -- -- -- -- 1.34  CREATININE 0.91 -- 0.92 0.86 --  LABCREA -- -- -- -- --  CREATININE 0.91 -- 0.92 0.86 --  LABCREA -- -- -- -- --  CREAT24HRUR -- -- -- -- --  MG 1.8 -- -- 1.7 --  PHOS 0.5* -- -- 2.1* --  ALBUMIN -- 2.2* 2.4* -- --  PROT -- -- 6.2 -- --  AST -- -- 57* -- --  ALT -- -- 49 -- --  ALKPHOS -- -- 116 -- --  BILITOT -- -- 0.5 -- --  BILIDIR -- -- -- -- --  IBILI -- -- -- -- --   Estimated Creatinine Clearance: 68.2 ml/min (by C-G formula based on Cr of 0.91).   Basename 02/12/12 0832 02/12/12 0733 02/12/12 0345  GLUCAP 91 37* 145*    Microbiology: Recent Results (from the past 720 hour(s))  URINE CULTURE     Status: Normal   Collection Time   02/09/12  8:52 AM      Component Value Range Status Comment   Specimen Description URINE, CLEAN CATCH   Final    Special Requests ADDED ON 02/09/12  AT 1050   Final    Culture  Setup Time 02/09/2012 16:54   Final    Colony Count >=100,000 COLONIES/ML   Final    Culture ESCHERICHIA COLI   Final    Report Status 02/11/2012 FINAL   Final    Organism ID, Bacteria ESCHERICHIA COLI   Final   CULTURE, BLOOD (ROUTINE X 2)     Status: Normal (Preliminary result)   Collection Time   02/09/12 10:41 AM      Component Value Range Status Comment   Specimen Description BLOOD LEFT ARM   Final    Special Requests BOTTLES DRAWN AEROBIC AND ANAEROBIC 10CC   Final    Culture  Setup Time 02/09/2012 16:55   Final    Culture     Final    Value:        BLOOD CULTURE RECEIVED NO GROWTH TO DATE CULTURE WILL BE HELD FOR 5 DAYS BEFORE ISSUING A FINAL NEGATIVE REPORT   Report Status PENDING   Incomplete   CULTURE, BLOOD (ROUTINE X 2)     Status: Normal (Preliminary result)   Collection Time   02/09/12 10:46 AM  Component Value Range Status Comment   Specimen Description BLOOD LEFT HAND   Final    Special Requests BOTTLES DRAWN AEROBIC AND ANAEROBIC 10CC   Final    Culture  Setup Time 02/09/2012 16:55   Final    Culture     Final    Value:        BLOOD CULTURE RECEIVED NO GROWTH TO DATE CULTURE WILL BE HELD FOR 5 DAYS BEFORE ISSUING A FINAL NEGATIVE REPORT   Report Status PENDING   Incomplete   MRSA PCR SCREENING     Status: Normal   Collection Time   02/09/12 12:49 PM      Component Value Range Status Comment   MRSA by PCR NEGATIVE  NEGATIVE Final   CLOSTRIDIUM DIFFICILE BY PCR     Status: Abnormal   Collection Time   02/09/12  3:18 PM      Component Value Range Status Comment   C difficile by pcr POSITIVE (*) NEGATIVE Final     Medications:  Scheduled:     . antiseptic oral rinse  15 mL Mouth Rinse q12n4p  . aspirin  81 mg Oral Daily  . chlorhexidine  15 mL Mouth Rinse BID  . dextrose      . DOPamine      . feeding supplement  30 mL Per Tube BID  . furosemide  40 mg Intravenous Q8H  . insulin aspart  0-4 Units Subcutaneous Q4H  . LORazepam       . magnesium sulfate 1 - 4 g bolus IVPB  2 g Intravenous Once  . vancomycin  500 mg Oral Q6H   And  . metronidazole  500 mg Intravenous Q8H  . multivitamin with minerals  1 tablet Oral Daily  . piperacillin-tazobactam (ZOSYN)  IV  3.375 g Intravenous Q8H  . potassium chloride  40 mEq Per Tube Q4H  . potassium chloride      . potassium phosphate IVPB (mmol)  30 mmol Intravenous Once  . ranitidine  150 mg Per Tube Daily  . sodium phosphate  Dextrose 5% IVPB  30 mmol Intravenous Once  . vancomycin  1,250 mg Intravenous Q24H  . DISCONTD: famotidine  20 mg Oral QAC supper  . DISCONTD: magnesium sulfate 1 - 4 g bolus IVPB  2 g Intravenous Once  . DISCONTD: phenytoin  300 mg Oral QHS  . DISCONTD: phenytoin (DILANTIN) IV  100 mg Intravenous Q8H    Assessment: Pt is a 71 YOM admitted with breakthrough seizure, fever, and chills. On Dilantin PTA. In May 2013, pt was reduced from dilantin 300mg  at hs to 200mg  at hs by primary MD. On 02/06/12 office visit, pt was increased to dilantin 250mg  at hs. Corrected dilantin level on admission was 11 (uncorrected 6.9): level not reflective of recent increase to dilantin 250mg . Per neurology recommendation: IV bolus of 500mg  of dilantin and start dilantin 300mg  at hs. Corrected dilantin level of 23 on 8/19 is reflective of the IV load and po dose that the pt received on 8/18. Pt changed from PO to IV dilantin at the same dose on 8/20 post cardiac arrest. Corrected dilantin level of 29.2(uncorrected 15.8) on 8/20 at 1450. Will hold dilantin and re-check dilantin level in the am.  Goal of Therapy:  Phenytoin level 10-20 Prevention of seizures  Plan: 1. Hold Dilantin for now 2. Dilantin level 8/22 in the AM and re-assess 3. Monitor for signs/symptoms of phenytoin toxicity 4. Monitor renal function  Abran Duke, PharmD Clinical  Pharmacist Phone: [phone] Pager: [phone] 02/12/2012 10:55 AM

## 2012-02-12 NOTE — Progress Notes (Signed)
Name: Joseph Copeland MRN: [id number] DOB: 10/01/1940    LOS: 3  Referring Provider:  Isidoro Donning ([facility])  Reason for Referral:  Sepsis/Post resp arrest 8/20   PULMONARY / CRITICAL CARE MEDICINE  HPI:  71yo male with hx HTN, seizure disorder, DM presented 8/18 with fevers (102.9), chills and ??seizure,  Per pts wife he has "not been himself" for 1-2 weeks with loss of appetite, diarrhea, chills and generalized weakness.  In ER, u/a was grossly positive for UTI, CXR clear and dilantin level low.  He was given dilantin, abx for UTI and admitted by [facility] to SDU.  However on admission to SDU he was noted to be significantly hypotensive with SBP 70's despite 3L fluids and PCCM consulted for septic shock and ICU tx.   8/20 returned to ICU post aspiration and code .  Vital Signs: Temp:  [98 F (36.7 C)-98.8 F (37.1 C)] 98.4 F (36.9 C) (08/21 0348) Pulse Rate:  [81-111] 96  (08/21 0700) Resp:  [20-34] 20  (08/21 0700) BP: (71-162)/(41-113) 85/57 mmHg (08/21 0700) SpO2:  [89 %-100 %] 100 % (08/21 0700) Arterial Line BP: (64-122)/(55-84) 91/57 mmHg (08/21 0700) FiO2 (%):  [40 %-100 %] 40 % (08/21 0724) Weight:  [64.5 kg (142 lb 3.2 oz)] 64.5 kg (142 lb 3.2 oz) (08/21 0400)  Physical Examination: General:  Sedated and intubated. Neuro:  Withdraws ext to pain. HEENT: [place]/AT, PERRL, EOM-I, -LAN & MMM. Cardiovascular: RRR, Nl S1/S2, -M/R/G. Lungs:  Coarse rhonchi bilat Abdomen:  Distended, soft, hypoactive bs, mildly tender diffusely Foley with phimosis Ext: cool, clammy, diaphoretic, no edema , skin breakdown along thoracic spine   Principal Problem:  *SIRS (systemic inflammatory response syndrome) Active Problems:  Seizures, generalized convulsive  HTN (hypertension)  DM (diabetes mellitus), type 2  UTI (lower urinary tract infection)  Abdominal distension  Septic shock  Delirium due to general medical condition  Cardiac arrest - asystole  Acute respiratory failure  Aspiration pneumonia  Hypoxemia  ARDS (adult respiratory distress syndrome)  Anoxic encephalopathy   ASSESSMENT AND PLAN  PULMONARY  Lab 02/11/12 1150 02/09/12 2215 02/09/12 2100 02/09/12 2000 02/09/12 1730  PHART 7.200* -- -- -- --  PCO2ART 27.0* -- -- -- --  PO2ART 228.0* -- -- -- --  HCO3 10.5* -- -- -- --  O2SAT 100.0 59.7 58.6 60.8 61.3   Ventilator Settings: Vent Mode:  [-] PRVC FiO2 (%):  [40 %-100 %] 40 % Set Rate:  [20 bmp] 20 bmp Vt Set:  [500 mL] 500 mL PEEP:  [5 cmH20-12 cmH20] 5 cmH20 Plateau Pressure:  [21 cmH20-28 cmH20] 22 cmH20 CXR:   US Abdomen Complete  02/10/2012  *RADIOLOGY REPORT*  Clinical Data:  Abdominal distention, hypertension, diabetes, question cirrhosis, ascites  ULTRASOUND ABDOMEN:  Technique:  Sonography of upper abdominal structures was performed. Imaging degraded by labored breathing.  Comparison:  06/04/2011  Gallbladder:  Gallbladder wall thickening up to 10 mm thick with hypoechoic striations consistent with edema.  No definite calculi or sonographic Reasoner's sign.  Common bile duct:  Upper normal caliber 6 mm diameter.  Liver:  Echogenic, question fatty infiltration though this can be seen with cirrhosis and certain infiltrative disorders.  No definite hepatic nodularity for irregular contours identified. Several small hypoechoic foci are seen question small cysts, largest 18 mm diameter. Additional of a 16 mm diameter hypoechoic focus centrally within right lobe, cannot exclude small solid nodule.  No intrahepatic biliary dilatation.  IVC:  Normal appearance  Pancreas:  Body and proximal tail  normal appearance, remainder obscured by bowel gas.  Spleen:  Normal appearance, 6.3 cm length.  Right kidney:  10.5 cm length. Normal morphology without mass or hydronephrosis.  Left kidney:  11.0 cm length.  Normal cortical thickness without definite solid mass or hydronephrosis.  Hypoechoic nodule centrally left kidney, 1.2 x 1.7 x 1.4 cm, corresponding to a cyst seen on the previous  exam, grossly stable in size but less well visualized on current study.  Aorta:  Limited visualization due to bowel gas.  Other:  No ascites.  IMPRESSION: Echogenic liver which could represent fatty infiltration or cirrhosis. Small hepatic cysts with an additional 1.6 cm diameter nonspecific hypoechoic focus centrally within the liver. A solid mass is not excluded; follow-up MR imaging with without contrast recommended to evaluate. Probable small left renal cyst 1.7 cm diameter. Incomplete visualization of pancreas and aorta. Nonspecific gallbladder wall thickening, though no definite calculi are visualized; acalculous cholecystitis not excluded.   Original Report Authenticated By: Lollie Marrow, M.D. ( 02/10/2012 09:57:15 )    Dg Chest Port 1 View  03-09-12  *RADIOLOGY REPORT*  Clinical Data: 71 year old male endotracheal tube advancement.  PORTABLE CHEST - 1 VIEW  Comparison: 1202 hours the same day earlier.  Findings: AP portable semi upright view at 1403 hours. Endotracheal tube tip now above the carina.  This should be retracted for more optimal place.  Stable left IJ central line.  Enteric tube now in place, looped in the gastric body region.  Stable lung volumes.  Mildly improved bibasilar ventilation.  No confluent opacity, pneumothorax, edema, or definite effusion. Stable cardiac size and mediastinal contours.  IMPRESSION:  1.  Endotracheal tube tip now above the carina.  Withdraw 2 cm for optimal placement. 2.  Enteric tube placed, looped at the gastric body. 3. No acute cardiopulmonary abnormality.   Original Report Authenticated By: Harley Hallmark, M.D.    Dg Chest Port 1 View  03-09-2012  *RADIOLOGY REPORT*  Clinical Data: Intubation.  PORTABLE CHEST - 1 VIEW  Comparison: 02/09/2012  Findings: An endotracheal tube is in place, with tip 2.9 cm above the carina.  A left internal jugular line has been placed.  Its tip is oriented transversely and projects over the SVC.  Low lung volumes noted with  mild cardiomegaly and thoracic spondylosis.  Linear subsegmental atelectasis noted at the right lung base.  IMPRESSION:  1.  ET tube tip 2.9 cm above the carina. 2.  Left IJ line tip projects over the SVC.  No pneumothorax observed. 3.  Low lung volumes. 4.  Mild atelectasis at the right lung base.   Original Report Authenticated By: Dellia Cloud, M.D.    Ct Portable Head W/o Cm  03/09/12  *RADIOLOGY REPORT*  Clinical Data: Cardiac arrest  CT HEAD WITHOUT CONTRAST  Technique:  Contiguous axial images were obtained from the base of the skull through the vertex without contrast.  Comparison: MRI 06/04/2011  Findings: Portable CT equipment was utilized.  Generalized atrophy.  Ventricle size is normal.  Negative for hemorrhage.  No acute infarct or mass.  IMPRESSION: No acute abnormality.   Original Report Authenticated By: Camelia Phenes, M.D.     ETT:  8/20>>>  A:  VDRF post vomitus and aspiration leading to respiratory arrest then cardiac arrest with PEA. Coded 03-10-2023 with 6 minutes of cpr.  P:   Continue full vent support until neurologic situation is addressed. Will attempt PS trials post MRI today.  CARDIOVASCULAR Lab 02/10/12 0209 02/09/12  2000 02/09/12 1728 02/09/12 1241 02/09/12 1053 02/09/12 1031  TROPONINI <0.30 -- <0.30 <0.30 <0.30 --  LATICACIDVEN -- 4.9* -- -- -- 4.1*  PROBNP -- -- -- -- -- --   ECG:   Lines: L IJ TLC 8/18>>>              8/20 rt rad aline>>  A: Septic shock and Hypotension resolved, now actually hypertensive when off sedation. 8/20 post pea arrest from respiratory cause  P:  D/C Levophed  Cortisol level 70.8 D/C stress steroids  RENAL  Lab 02/12/12 0340 02/11/12 1140 02/11/12 0512 02/10/12 0209 02/09/12 1242 02/09/12 0606  NA 139 141 137 137 -- 130*  K 2.6* 3.4* -- -- -- --  CL 107 104 103 103 -- 89*  CO2 22 13* 17* 17* -- 25  BUN 26* 26* 28* 21 -- 17  CREATININE 0.91 0.92 0.86 0.91 1.04 --  CALCIUM 8.2* 8.3* 8.3* 7.8* -- 9.8  MG 1.8 -- 1.7  -- -- --  PHOS 0.5* -- 2.1* -- -- --   Intake/Output      08/20 0701 - 08/21 0700 08/21 0701 - 08/22 0700   P.O.     I.V. (mL/kg) 2505 (38.8)    NG/GT 320    IV Piggyback 464.5    Total Intake(mL/kg) 3289.5 (51)    Urine (mL/kg/hr) 1070 (0.7)    Emesis/NG output 60    Total Output 1130    Net +2159.5         Emesis Occurrence 2 x      Intake/Output Summary (Last 24 hours) at 02/12/12 0748 Last data filed at 02/12/12 0600  Gross per 24 hour  Intake 3289.5 ml  Output   1130 ml  Net 2159.5 ml   Foley:  8/18>>  A:  Metabolic acidosis, resolved. P:   D/C bicarb drip. Follow abg/cmet. Gentle diureses today.  GASTROINTESTINAL  Lab 02/11/12 1450 02/11/12 1140 02/09/12 1038  AST -- 57* 49*  ALT -- 49 34  ALKPHOS -- 116 409*  BILITOT -- 0.5 1.0  PROT -- 6.2 6.0  ALBUMIN 2.2* 2.4* 2.6*    A:  Abd distension Diarrhea  Elevated Alk Phos? P:   CDiff PCR positive, on PO vanc and IV flagyl.  Abd ultrasound noted.  HEMATOLOGIC  Lab 02/12/12 0340 02/11/12 1140 02/11/12 0512 02/10/12 0209 02/09/12 1530 02/09/12 1242  HGB 10.3* 10.7* 11.1* 11.1* -- 11.4*  HCT 28.6* 31.8* 31.6* 31.9* -- 33.0*  PLT 94* 100* 90* 86* 78* --  INR -- -- -- -- 1.34 --  APTT -- -- -- -- 37 --   A:   Thrombocytopenia; due to sepsis ? DIC? HUS? P:  F/u cbc  D/c lovenox  SCD's for now HIT panel ordered and pending.  INFECTIOUS  Lab 02/12/12 0340 02/11/12 1140 02/11/12 0512 02/10/12 0209 02/09/12 1242 02/09/12 1038  WBC 13.3* 31.6* 21.4* 16.7* 9.0 --  PROCALCITON -- -- -- -- -- 46.35   Cultures: BCx2 8/18>>> Urine 8/18>>>e coli>>ss pip/tazo Cdiff 8/18 >>++ Antibiotics: Rocephin 8/18>>>8/18 Vanc PO 8/18>>> 8/18 Zosyn>> Vanc IV 8/18>>> Flagyl 8/18>>>  A:  UTI Diarrhea  Elevated alk phos and mild abdominal pain worrisome, worrisome for cholecystitis but exam benign C. Diff. Aspiration PNA P:   Abx as above  Will tailor down as cultures are resulted.  ENDOCRINE  Lab 02/12/12  0345 02/11/12 2324 02/11/12 1941 02/11/12 1536 02/11/12 1217  GLUCAP 145* 179* 77 176* 170*   A:  DM  P:   ICU hyperglycemia protocol  May need lantus with addition steroids   NEUROLOGIC  A: Post arrest with sz activity P:   Cont anticonvulsants Brain MRI ordered for today. Neuro following.  CC time 35 min.  Alyson Reedy, M.D. [facility] Pulmonary/Critical Care Medicine. Pager: [phone]. After hours pager: [phone].

## 2012-02-12 NOTE — Progress Notes (Signed)
[facility] NEURO HOSPITALIST PROGRESS NOTE    SUBJECTIVE   Pt is still intubated. Off pressors. He is responsive  and tries to communicate. No significant new change in MS. Ct Head No acute abnormalities. MRI brain pending.   OBJECTIVE   Vital signs in last 24 hours: Temp:  [98 F (36.7 C)-98.8 F (37.1 C)] 98.8 F (37.1 C) (08/21 0738) Pulse Rate:  [81-111] 93  (08/21 0800) Resp:  [20-34] 20  (08/21 0800) BP: (71-162)/(41-113) 88/61 mmHg (08/21 0800) SpO2:  [89 %-100 %] 100 % (08/21 0800) Arterial Line BP: (64-122)/(55-84) 102/55 mmHg (08/21 0800) FiO2 (%):  [40 %-100 %] 40 % (08/21 0724) Weight:  [142 lb 3.2 oz (64.5 kg)] 142 lb 3.2 oz (64.5 kg) (08/21 0400)  Intake/Output from previous day: 08/20 0701 - 08/21 0700 In: 3547.5 [I.V.:2630; NG/GT:360; IV Piggyback:557.5] Out: 1355 [Urine:1295; Emesis/NG output:60] Intake/Output this shift: Total I/O In: 470.5 [I.V.:125; NG/GT:40; IV Piggyback:305.5] Out: -  Nutritional status: NPO  Past Medical History  Diagnosis Date  . Hypertension   . Arthritis   . Seizures     first12/12-none since  . Diabetes mellitus     type 2-no meds  . Sleep apnea     nasal CPAP at HS    Neurologic ROS negative with exception of above.  Neurologic Exam:   intubated, follows commands. Moving all the 4 extremities but the left UE seems slightly weak  Sensory intact  No rigidity, no clonus  No lip smacking or eye flutter noticed   Lab Results: Lab Results  Component Value Date/Time   CHOL 223* 12/28/2010  7:05 AM   Lipid Panel No results found for this basename: CHOL,TRIG,HDL,CHOLHDL,VLDL,LDLCALC in the last 72 hours  Studies/Results: Dg Chest Port 1 View  02/12/2012  *RADIOLOGY REPORT*  Clinical Data: Ventilator dependent respiratory failure.  PORTABLE CHEST - 1 VIEW  Comparison: 02/11/2012  Findings: Endotracheal tube has been pulled back with tip now 2 cm above carina.  Nasogastric tube and  left jugular center venous catheter remain in appropriate position.  Low lung volumes and mild bibasilar atelectasis show no significant change.  No evidence of pulmonary consolidation or edema.  Heart size is normal.  IMPRESSION:  1.  Low lung volumes and mild bibasilar atelectasis, without significant change. 2.  Endotracheal tube in appropriate position.   Original Report Authenticated By: Danae Orleans, M.D.    Dg Chest Port 1 View  02/11/2012  *RADIOLOGY REPORT*  Clinical Data: 71 year old male endotracheal tube advancement.  PORTABLE CHEST - 1 VIEW  Comparison: 1202 hours the same day earlier.  Findings: AP portable semi upright view at 1403 hours. Endotracheal tube tip now above the carina.  This should be retracted for more optimal place.  Stable left IJ central line.  Enteric tube now in place, looped in the gastric body region.  Stable lung volumes.  Mildly improved bibasilar ventilation.  No confluent opacity, pneumothorax, edema, or definite effusion. Stable cardiac size and mediastinal contours.  IMPRESSION:  1.  Endotracheal tube tip now above the carina.  Withdraw 2 cm for optimal placement. 2.  Enteric tube placed, looped at the gastric body. 3. No acute cardiopulmonary abnormality.   Original Report Authenticated By: Harley Hallmark, M.D.    Dg Chest [facility]  02/11/2012  *RADIOLOGY REPORT*  Clinical Data: Intubation.  PORTABLE CHEST - 1 VIEW  Comparison: 02/09/2012  Findings: An endotracheal tube is in place, with tip 2.9 cm above the carina.  A left internal jugular line has been placed.  Its tip is oriented transversely and projects over the SVC.  Low lung volumes noted with mild cardiomegaly and thoracic spondylosis.  Linear subsegmental atelectasis noted at the right lung base.  IMPRESSION:  1.  ET tube tip 2.9 cm above the carina. 2.  Left IJ line tip projects over the SVC.  No pneumothorax observed. 3.  Low lung volumes. 4.  Mild atelectasis at the right lung base.   Original Report  Authenticated By: Dellia Cloud, M.D.    Ct Portable Head W/o Cm  02/11/2012  *RADIOLOGY REPORT*  Clinical Data: Cardiac arrest  CT HEAD WITHOUT CONTRAST  Technique:  Contiguous axial images were obtained from the base of the skull through the vertex without contrast.  Comparison: MRI 06/04/2011  Findings: Portable CT equipment was utilized.  Generalized atrophy.  Ventricle size is normal.  Negative for hemorrhage.  No acute infarct or mass.  IMPRESSION: No acute abnormality.   Original Report Authenticated By: Camelia Phenes, M.D.     Medications:    I have reviewed the patient's current medications.  Assessment/Plan:    Patient Active Problem List  Diagnosis  . Seizures, generalized convulsive  . HTN (hypertension)  . DM (diabetes mellitus), type 2  . Anemia  . Thrombocytopenia  . SIRS (systemic inflammatory response syndrome)  . UTI (lower urinary tract infection)  . Abdominal distension  . Septic shock  . Delirium due to general medical condition  . Cardiac arrest - asystole  . Acute respiratory failure  . Aspiration pneumonia  . Hypoxemia  . ARDS (adult respiratory distress syndrome)  . Anoxic encephalopathy   Gen. Seizure/ Acute Encephalopathy/Delirium: Patient still intubated. CT head portable negative for acute changes. Dilantin level -  corrected 31. - Will hold Dilantin for today and recheck levels tomorrow. - MRI brain pending. Decision for extubation after MRI results and extent of agitation. - Discussed with Dr. Roseanne Reno.  PATEL,RAVI MD  02/12/2012, 10:23 AM

## 2012-02-12 NOTE — Progress Notes (Signed)
[facility] ADULT ICU REPLACEMENT PROTOCOL FOR AM LAB REPLACEMENT ONLY  The patient does apply for the [facility] Adult ICU Electrolyte Replacment Protocol based on the criteria listed below:   1. Is GFR >/= 50 ml/min? yes  Patient's GFR today is >90 2. Is urine output >/= 0.5 ml/kg/hr for the last 8 hours? yes Patient's UOP is .8 ml/kg/hr 3. Is BUN < 30 mg/dL? yes  Patient's BUN today is 26 4. Abnormal electrolyte(s): K-2.6 Mg-1.8 Phos 0.5 5. Ordered repletion with: per protocol 6. If a panic level lab has been reported, has the CCM MD in charge been notified? yes.   Physician:  Dr deterding  Llana Aliment 02/12/2012 5:13 AM

## 2012-02-13 ENCOUNTER — Inpatient Hospital Stay (HOSPITAL_COMMUNITY): Payer: Medicare Other

## 2012-02-13 DIAGNOSIS — J690 Pneumonitis due to inhalation of food and vomit: Secondary | ICD-10-CM

## 2012-02-13 DIAGNOSIS — J9600 Acute respiratory failure, unspecified whether with hypoxia or hypercapnia: Secondary | ICD-10-CM

## 2012-02-13 DIAGNOSIS — I469 Cardiac arrest, cause unspecified: Secondary | ICD-10-CM

## 2012-02-13 LAB — GLUCOSE, CAPILLARY
Glucose-Capillary: 200 mg/dL — ABNORMAL HIGH (ref 70–99)
Glucose-Capillary: 123 mg/dL — ABNORMAL HIGH (ref 70–99)

## 2012-02-13 LAB — BASIC METABOLIC PANEL
CO2: 27 mEq/L (ref 19–32)
Chloride: 108 mEq/L (ref 96–112)
GFR calc Af Amer: >90 mL/min (ref >90)
Potassium: 3.7 mEq/L (ref 3.5–5.1)

## 2012-02-13 LAB — MAGNESIUM: Magnesium: 1.7 mg/dL (ref 1.5–2.5)

## 2012-02-13 LAB — CBC
Platelets: 137 10*3/uL — ABNORMAL LOW (ref 150–400)
RBC: 3.28 MIL/uL — ABNORMAL LOW (ref 4.22–5.81)
RDW: 18.4 % — ABNORMAL HIGH (ref 11.5–15.5)
WBC: 13.6 10*3/uL — ABNORMAL HIGH (ref 4.0–10.5)

## 2012-02-13 LAB — PHENYTOIN LEVEL, TOTAL: Phenytoin Lvl: 14.9 ug/mL (ref 10.0–20.0)

## 2012-02-13 LAB — PHOSPHORUS: Phosphorus: 3.1 mg/dL (ref 2.3–4.6)

## 2012-02-13 MED ORDER — INSULIN ASPART 100 UNIT/ML ~~LOC~~ SOLN
0.0000 [IU] | Freq: Three times a day (TID) | SUBCUTANEOUS | Status: DC
  Administered 2012-02-14 (×2): 1 [IU] via SUBCUTANEOUS
  Administered 2012-02-15: 2 [IU] via SUBCUTANEOUS
  Administered 2012-02-16 (×3): 1 [IU] via SUBCUTANEOUS
  Administered 2012-02-17: 18:00:00 via SUBCUTANEOUS
  Administered 2012-02-17 (×2): 1 [IU] via SUBCUTANEOUS

## 2012-02-13 MED ORDER — MAGNESIUM SULFATE 40 MG/ML IJ SOLN
2.0000 g | Freq: Once | INTRAMUSCULAR | Status: AC
  Administered 2012-02-13: 2 g via INTRAVENOUS
  Filled 2012-02-13: qty 50

## 2012-02-13 MED ORDER — FUROSEMIDE 10 MG/ML IJ SOLN
INTRAMUSCULAR | Status: AC
  Filled 2012-02-13: qty 4

## 2012-02-13 MED ORDER — POTASSIUM CHLORIDE CRYS ER 20 MEQ PO TBCR
40.0000 meq | EXTENDED_RELEASE_TABLET | Freq: Three times a day (TID) | ORAL | Status: AC
  Administered 2012-02-13 (×2): 40 meq via ORAL
  Filled 2012-02-13 (×2): qty 2

## 2012-02-13 MED ORDER — FUROSEMIDE 10 MG/ML IJ SOLN
20.0000 mg | Freq: Four times a day (QID) | INTRAMUSCULAR | Status: AC
  Administered 2012-02-13 (×2): 20 mg via INTRAVENOUS

## 2012-02-13 MED ORDER — FUROSEMIDE 10 MG/ML IJ SOLN
INTRAMUSCULAR | Status: AC
  Filled 2012-02-13: qty 2

## 2012-02-13 MED ORDER — MAGNESIUM SULFATE 40 MG/ML IJ SOLN
2.0000 g | Freq: Once | INTRAMUSCULAR | Status: DC
  Filled 2012-02-13: qty 50

## 2012-02-13 NOTE — Progress Notes (Signed)
Name: Joseph Copeland MRN: [id number] DOB: August 08, 1940    LOS: 4  Referring Provider:  Isidoro Donning ([facility])  Reason for Referral:  Sepsis/Post resp arrest 8/20   PULMONARY / CRITICAL CARE MEDICINE  HPI:  71yo male with hx HTN, seizure disorder, DM presented 8/18 with fevers (102.9), chills and ??seizure,  Per pts wife he has "not been himself" for 1-2 weeks with loss of appetite, diarrhea, chills and generalized weakness.  In ER, u/a was grossly positive for UTI, CXR clear and dilantin level low.  He was given dilantin, abx for UTI and admitted by [facility] to SDU.  However on admission to SDU he was noted to be significantly hypotensive with SBP 70's despite 3L fluids and PCCM consulted for septic shock and ICU tx.   8/20 returned to ICU post aspiration and code .  Vital Signs: Temp:  [97.4 F (36.3 C)-99.1 F (37.3 C)] 97.4 F (36.3 C) (08/22 0842) Pulse Rate:  [9-104] 78  (08/22 0800) Resp:  [20-30] 29  (08/22 0800) BP: (82-116)/(54-74) 103/74 mmHg (08/22 0800) SpO2:  [98 %-100 %] 99 % (08/22 0800) Arterial Line BP: (89-132)/(55-77) 119/67 mmHg (08/22 0800) FiO2 (%):  [40 %] 40 % (08/21 1700) Weight:  [63.1 kg (139 lb 1.8 oz)] 63.1 kg (139 lb 1.8 oz) (08/22 0400)  Physical Examination: General:  Alert and oriented, appropriate. Neuro:  Moving all ext to command. HEENT: [place]/AT, PERRL, EOM-I, -LAN & MMM. Cardiovascular: RRR, Nl S1/S2, -M/R/G. Lungs:  Coarse rhonchi bilat with bibasilar crackles. Abdomen:  Distended, soft, normal bs, mildly tender diffusely Foley with phimosis Ext: cool, clammy, diaphoretic, no edema , skin breakdown along thoracic spine   Principal Problem:  *SIRS (systemic inflammatory response syndrome) Active Problems:  Seizures, generalized convulsive  HTN (hypertension)  DM (diabetes mellitus), type 2  UTI (lower urinary tract infection)  Abdominal distension  Septic shock  Delirium due to general medical condition  Cardiac arrest - asystole  Acute respiratory  failure  Aspiration pneumonia  Hypoxemia  ARDS (adult respiratory distress syndrome)  Anoxic encephalopathy   ASSESSMENT AND PLAN  PULMONARY  Lab 02/11/12 1150 02/09/12 2215 02/09/12 2100 02/09/12 2000 02/09/12 1730  PHART 7.200* -- -- -- --  PCO2ART 27.0* -- -- -- --  PO2ART 228.0* -- -- -- --  HCO3 10.5* -- -- -- --  O2SAT 100.0 59.7 58.6 60.8 61.3   Ventilator Settings: Vent Mode:  [-] CPAP FiO2 (%):  [40 %] 40 % Set Rate:  [2 bmp-20 bmp] 20 bmp Vt Set:  [500 mL] 500 mL PEEP:  [5 cmH20] 5 cmH20 Pressure Support:  [10 cmH20] 10 cmH20 Plateau Pressure:  [17 cmH20] 17 cmH20 CXR:   Dg Chest Port 1 View  02/13/2012  *RADIOLOGY REPORT*  Clinical Data: Evaluate ET tube placement  PORTABLE CHEST - 1 VIEW  Comparison: 02/12/2012  Findings: There is been interval extubation and removal of the nasogastric tube. The left IJ catheter tip remains and is in the projection of the innominate vein.  No pleural effusion or edema.  No airspace consolidation.  Review of the visualized osseous structures is unremarkable.  IMPRESSION:  1.  Status post extubation.   Original Report Authenticated By: Rosealee Albee, M.D.    Dg Chest Port 1 View  02/12/2012  *RADIOLOGY REPORT*  Clinical Data: Ventilator dependent respiratory failure.  PORTABLE CHEST - 1 VIEW  Comparison: 02/11/2012  Findings: Endotracheal tube has been pulled back with tip now 2 cm above carina.  Nasogastric tube and left jugular  center venous catheter remain in appropriate position.  Low lung volumes and mild bibasilar atelectasis show no significant change.  No evidence of pulmonary consolidation or edema.  Heart size is normal.  IMPRESSION:  1.  Low lung volumes and mild bibasilar atelectasis, without significant change. 2.  Endotracheal tube in appropriate position.   Original Report Authenticated By: Danae Orleans, M.D.    Dg Chest Port 1 View  2012-02-27  *RADIOLOGY REPORT*  Clinical Data: 71 year old male endotracheal tube  advancement.  PORTABLE CHEST - 1 VIEW  Comparison: 1202 hours the same day earlier.  Findings: AP portable semi upright view at 1403 hours. Endotracheal tube tip now above the carina.  This should be retracted for more optimal place.  Stable left IJ central line.  Enteric tube now in place, looped in the gastric body region.  Stable lung volumes.  Mildly improved bibasilar ventilation.  No confluent opacity, pneumothorax, edema, or definite effusion. Stable cardiac size and mediastinal contours.  IMPRESSION:  1.  Endotracheal tube tip now above the carina.  Withdraw 2 cm for optimal placement. 2.  Enteric tube placed, looped at the gastric body. 3. No acute cardiopulmonary abnormality.   Original Report Authenticated By: Harley Hallmark, M.D.    Dg Chest Port 1 View  02/27/12  *RADIOLOGY REPORT*  Clinical Data: Intubation.  PORTABLE CHEST - 1 VIEW  Comparison: 02/09/2012  Findings: An endotracheal tube is in place, with tip 2.9 cm above the carina.  A left internal jugular line has been placed.  Its tip is oriented transversely and projects over the SVC.  Low lung volumes noted with mild cardiomegaly and thoracic spondylosis.  Linear subsegmental atelectasis noted at the right lung base.  IMPRESSION:  1.  ET tube tip 2.9 cm above the carina. 2.  Left IJ line tip projects over the SVC.  No pneumothorax observed. 3.  Low lung volumes. 4.  Mild atelectasis at the right lung base.   Original Report Authenticated By: Dellia Cloud, M.D.    Ct Portable Head W/o Cm  February 27, 2012  *RADIOLOGY REPORT*  Clinical Data: Cardiac arrest  CT HEAD WITHOUT CONTRAST  Technique:  Contiguous axial images were obtained from the base of the skull through the vertex without contrast.  Comparison: MRI 06/04/2011  Findings: Portable CT equipment was utilized.  Generalized atrophy.  Ventricle size is normal.  Negative for hemorrhage.  No acute infarct or mass.  IMPRESSION: No acute abnormality.   Original Report Authenticated By:  Camelia Phenes, M.D.     ETT:  8/20>>>8/22  A:  VDRF post vomitus and aspiration leading to respiratory arrest then cardiac arrest with PEA. Coded 02-27-2023 with 6 minutes of cpr.  P:   Extubate. Swallow evaluation. OOB to chair and PT/OT. Titrate O2.  CARDIOVASCULAR  Lab 02/10/12 0209 02/09/12 2000 02/09/12 1728 02/09/12 1241 02/09/12 1053 02/09/12 1031  TROPONINI <0.30 -- <0.30 <0.30 <0.30 --  LATICACIDVEN -- 4.9* -- -- -- 4.1*  PROBNP -- -- -- -- -- --   ECG:   Lines: L IJ TLC 8/18>>>              8/20 rt rad aline>>8/22  A: Septic shock and Hypotension resolved, now actually hypertensive when off sedation. 02/27/23 post pea arrest from respiratory cause  P:  D/C Levophed  Cortisol level 70.8 D/C stress steroids  RENAL  Lab 02/13/12 0507 02/12/12 2000 02/12/12 0340 February 27, 2012 1140 02/27/2012 0512  NA 146* 148* 139 141 137  K 3.7  2.7* -- -- --  CL 108 109 107 104 103  CO2 27 29 22  13* 17*  BUN 18 19 26* 26* 28*  CREATININE 0.91 0.95 0.91 0.92 0.86  CALCIUM 8.1* 8.5 8.2* 8.3* 8.3*  MG 1.7 1.8 1.8 -- 1.7  PHOS 3.1 2.6 0.5* -- 2.1*   Intake/Output      08/21 0701 - 08/22 0700 08/22 0701 - 08/23 0700   I.V. (mL/kg) 125 (2)    NG/GT 360    IV Piggyback 1762.5 50   Total Intake(mL/kg) 2247.5 (35.6) 50 (0.8)   Urine (mL/kg/hr) 6145 (4.1) 225   Emesis/NG output     Stool 200 300   Total Output 6345 525   Net -4097.5 -475        Stool Occurrence 4 x      Intake/Output Summary (Last 24 hours) at 02/13/12 0856 Last data filed at 02/13/12 0800  Gross per 24 hour  Intake   1827 ml  Output   6720 ml  Net  -4893 ml   Foley:  8/18>>  A:  Metabolic acidosis, resolved. P:   D/C bicarb drip. Follow abg/cmet. Gentle diureses today.  GASTROINTESTINAL  Lab 02/11/12 1450 02/11/12 1140 02/09/12 1038  AST -- 57* 49*  ALT -- 49 34  ALKPHOS -- 116 409*  BILITOT -- 0.5 1.0  PROT -- 6.2 6.0  ALBUMIN 2.2* 2.4* 2.6*    A:  Abd distension Diarrhea  Elevated Alk Phos? P:     CDiff PCR positive, on PO vanc and IV flagyl.  Abd ultrasound noted. Swallow evaluation, history of dysphagia.  HEMATOLOGIC  Lab 02/13/12 0507 02/12/12 0340 02/11/12 1140 02/11/12 0512 02/10/12 0209 02/09/12 1530  HGB 10.6* 10.3* 10.7* 11.1* 11.1* --  HCT 30.1* 28.6* 31.8* 31.6* 31.9* --  PLT 137* 94* 100* 90* 86* --  INR -- -- -- -- -- 1.34  APTT -- -- -- -- -- 37   A:   Thrombocytopenia; due to sepsis ? DIC? HUS? P:  F/u cbc  D/c lovenox  SCD's for now HIT panel ordered and pending.  INFECTIOUS  Lab 02/13/12 0507 02/12/12 0340 02/11/12 1140 02/11/12 0512 02/10/12 0209 02/09/12 1038  WBC 13.6* 13.3* 31.6* 21.4* 16.7* --  PROCALCITON -- -- -- -- -- 46.35   Cultures: BCx2 8/18>>> Urine 8/18>>>e coli>>ss to all but amp Cdiff 8/18 >>++ Antibiotics: Rocephin 8/18>>>8/18 Vanc PO 8/18>>> 8/18 Zosyn>> Vanc IV 8/18>>>8/22 Flagyl 8/18>>>  A:  UTI Diarrhea  Elevated alk phos and mild abdominal pain worrisome, worrisome for cholecystitis but exam benign C. Diff. Aspiration PNA P:   D/C vancomycin. Keep on zosyn for UTI and aspiration at least until results of new cultures are out. Will likely need urology to address BPH and frequent UTI.  ENDOCRINE  Lab 02/13/12 0751 02/13/12 0427 02/13/12 0012 02/12/12 2117 02/12/12 1543  GLUCAP 130* 121* 92 160* 151*   A:  DM   P:   ICU hyperglycemia protocol  May need lantus with addition steroids   NEUROLOGIC  A: Post arrest with sz activity P:   Cont anticonvulsants Brain MRI ordered for today. Neuro following.  Will transfer to SDU and back to Dr. Kevan Ny' service.  PCCM signing off, please call back if needed.  CC time 35 min.  Alyson Reedy, M.D. [facility] Pulmonary/Critical Care Medicine. Pager: [phone]. After hours pager: [phone].

## 2012-02-13 NOTE — Progress Notes (Signed)
PHARMACY - CRITICAL CARE PROGRESS NOTE  Pharmacy Consult for Phenytoin Indication: Breakthrough Seizures   No Known Allergies  Patient Measurements: Height: 5\' 6"  (167.6 cm) Weight: 139 lb 1.8 oz (63.1 kg) IBW/kg (Calculated) : 63.8   Vital Signs: Temp: 97.4 F (36.3 C) (08/22 0842) Temp src: Oral (08/22 0842) BP: 103/74 mmHg (08/22 0800) Pulse Rate: 78  (08/22 0800) Intake/Output from previous day: 08/21 0701 - 08/22 0700 In: 2247.5 [I.V.:125; NG/GT:360; IV Piggyback:1762.5] Out: 7829 [FAOZH:0865; Stool:200] Intake/Output from this shift: Total I/O In: 50 [IV Piggyback:50] Out: 700 [Urine:400; Stool:300] Vent settings for last 24 hours: Vent Mode:  [-] CPAP FiO2 (%):  [40 %] 40 % Set Rate:  [2 bmp-20 bmp] 20 bmp Vt Set:  [500 mL] 500 mL PEEP:  [5 cmH20] 5 cmH20 Pressure Support:  [10 cmH20] 10 cmH20 Plateau Pressure:  [17 cmH20] 17 cmH20  Labs:  Basename 02/13/12 0507 02/12/12 2000 02/12/12 0340 02/11/12 1450 02/11/12 1140  WBC 13.6* -- 13.3* -- 31.6*  HGB 10.6* -- 10.3* -- 10.7*  HCT 30.1* -- 28.6* -- 31.8*  PLT 137* -- 94* -- 100*  APTT -- -- -- -- --  INR -- -- -- -- --  CREATININE 0.91 0.95 0.91 -- --  LABCREA -- -- -- -- --  CREATININE 0.91 0.95 0.91 -- --  LABCREA -- -- -- -- --  CREAT24HRUR -- -- -- -- --  MG 1.7 1.8 1.8 -- --  PHOS 3.1 2.6 0.5* -- --  ALBUMIN -- -- -- 2.2* 2.4*  PROT -- -- -- -- 6.2  AST -- -- -- -- 57*  ALT -- -- -- -- 49  ALKPHOS -- -- -- -- 116  BILITOT -- -- -- -- 0.5  BILIDIR -- -- -- -- --  IBILI -- -- -- -- --   Estimated Creatinine Clearance: 67.4 ml/min (by C-G formula based on Cr of 0.91).   Basename 02/13/12 0751 02/13/12 0427 02/13/12 0012  GLUCAP 130* 121* 92    Microbiology: Recent Results (from the past 720 hour(s))  URINE CULTURE     Status: Normal   Collection Time   02/09/12  8:52 AM      Component Value Range Status Comment   Specimen Description URINE, CLEAN CATCH   Final    Special Requests ADDED  ON 02/09/12 AT 1050   Final    Culture  Setup Time 02/09/2012 16:54   Final    Colony Count >=100,000 COLONIES/ML   Final    Culture ESCHERICHIA COLI   Final    Report Status 02/11/2012 FINAL   Final    Organism ID, Bacteria ESCHERICHIA COLI   Final   CULTURE, BLOOD (ROUTINE X 2)     Status: Normal (Preliminary result)   Collection Time   02/09/12 10:41 AM      Component Value Range Status Comment   Specimen Description BLOOD LEFT ARM   Final    Special Requests BOTTLES DRAWN AEROBIC AND ANAEROBIC 10CC   Final    Culture  Setup Time 02/09/2012 16:55   Final    Culture     Final    Value:        BLOOD CULTURE RECEIVED NO GROWTH TO DATE CULTURE WILL BE HELD FOR 5 DAYS BEFORE ISSUING A FINAL NEGATIVE REPORT   Report Status PENDING   Incomplete   CULTURE, BLOOD (ROUTINE X 2)     Status: Normal (Preliminary result)   Collection Time   02/09/12 10:46 AM  Component Value Range Status Comment   Specimen Description BLOOD LEFT HAND   Final    Special Requests BOTTLES DRAWN AEROBIC AND ANAEROBIC 10CC   Final    Culture  Setup Time 02/09/2012 16:55   Final    Culture     Final    Value:        BLOOD CULTURE RECEIVED NO GROWTH TO DATE CULTURE WILL BE HELD FOR 5 DAYS BEFORE ISSUING A FINAL NEGATIVE REPORT   Report Status PENDING   Incomplete   MRSA PCR SCREENING     Status: Normal   Collection Time   02/09/12 12:49 PM      Component Value Range Status Comment   MRSA by PCR NEGATIVE  NEGATIVE Final   CLOSTRIDIUM DIFFICILE BY PCR     Status: Abnormal   Collection Time   02/09/12  3:18 PM      Component Value Range Status Comment   C difficile by pcr POSITIVE (*) NEGATIVE Final     Medications:  Scheduled:     . antiseptic oral rinse  15 mL Mouth Rinse q12n4p  . aspirin  81 mg Oral Daily  . chlorhexidine  15 mL Mouth Rinse BID  . furosemide      . furosemide  20 mg Intravenous Q6H  . furosemide  40 mg Intravenous Q8H  . insulin aspart  0-4 Units Subcutaneous Q4H  . magnesium sulfate  1 - 4 g bolus IVPB  2 g Intravenous Once  . vancomycin  500 mg Oral Q6H   And  . metronidazole  500 mg Intravenous Q8H  . multivitamin with minerals  1 tablet Oral Daily  . piperacillin-tazobactam (ZOSYN)  IV  3.375 g Intravenous Q8H  . potassium chloride  10 mEq Intravenous Q1 Hr x 6  . potassium chloride  40 mEq Per Tube Q4H  . potassium chloride      . potassium chloride  40 mEq Oral TID  . potassium phosphate IVPB (mmol)  30 mmol Intravenous Once  . ranitidine  150 mg Per Tube Daily  . sodium phosphate  Dextrose 5% IVPB  30 mmol Intravenous Once  . DISCONTD: feeding supplement  30 mL Per Tube BID  . DISCONTD: magnesium sulfate 1 - 4 g bolus IVPB  2 g Intravenous Once  . DISCONTD: phenytoin (DILANTIN) IV  100 mg Intravenous Q8H  . DISCONTD: vancomycin  1,250 mg Intravenous Q24H    Assessment: Pt is a 71 YOM admitted with breakthrough seizure, fever, and chills. On Dilantin PTA. In May 2013, pt was reduced from dilantin 300mg  at hs to 200mg  at hs by primary MD. On 02/06/12 office visit, pt was increased to dilantin 250mg  at hs. Corrected dilantin level on admission was 11 (uncorrected 6.9): level not reflective of recent increase to dilantin 250mg . Per neurology recommendation: IV bolus of 500mg  of dilantin and start dilantin 300mg  at hs. Corrected dilantin level of 23 on 8/19 is reflective of the IV load and po dose that the pt received on 8/18. Corrected dilantin level of 29.2(uncorrected 15.8) on 8/20 at 1450. Corrected dilantin level of 27.6(uncorrected 14.9) on 8/22.   Goal of Therapy:  Phenytoin level 10-20 Prevention of seizures  Plan: 1. Hold Dilantin for now 2. Dilantin level 8/23 in the AM and re-assess 3. Monitor for signs/symptoms of phenytoin toxicity 4. Monitor renal function  Abran Duke, PharmD Clinical Pharmacist Phone: [phone] Pager: [phone] 02/13/2012 10:29 AM

## 2012-02-13 NOTE — Progress Notes (Signed)
[facility] ADULT ICU REPLACEMENT PROTOCOL FOR AM LAB REPLACEMENT ONLY  The patient does apply for the [facility] Adult ICU Electrolyte Replacment Protocol based on the criteria listed below:   1. Is GFR >/= 50 ml/min? yes  Patient's GFR today is >90 2. Is urine output >/= 0.5 ml/kg/hr for the last 8 hours? yes Patient's UOP is .95 ml/kg/hr 3. Is BUN < 30 mg/dL? yes  Patient's BUN today is 18 4. Abnormal electrolyte(s):Mg- 1.7 5. Ordered repletion with:per protocol    Physician:  Dr Darrick Penna  Llana Aliment 02/13/2012 6:24 AM

## 2012-02-13 NOTE — Progress Notes (Signed)
Subjective: Patient seen this morning and examined. Confused though where he was. Could not name the hospital. Was alert and knew my name. No shortness of breath or complaints of pain. He had a bowel movement in the bed  Objective: Weight change: -1.4 kg (-3 lb 1.4 oz)  Intake/Output Summary (Last 24 hours) at 02/13/12 1536 Last data filed at 02/13/12 1230  Gross per 24 hour  Intake 1320.5 ml  Output   5795 ml  Net -4474.5 ml   Filed Vitals:   02/13/12 1131 02/13/12 1200 02/13/12 1300 02/13/12 1324  BP:  106/90 123/75 121/66  Pulse:  105 78 84  Temp: 98.7 F (37.1 C)  98.8 F (37.1 C)   TempSrc: Oral  Oral   Resp:  22 21 25   Height:      Weight:      SpO2:  99% 100% 99%   General Appearance: Alert, cooperative, no distress, appears stated age Head: Normocephalic, without obvious abnormality, atraumatic Neck: Supple, symmetrical Lungs: Clear to auscultation bilaterally, respirations unlabored Heart: Regular rate and rhythm, S1 and S2 normal, no murmur, rub or gallop Abdomen: Soft, non-tender, bowel sounds active all four quadrants, no masses, no organomegaly Extremities: Extremities normal, atraumatic, no cyanosis or edema Pulses: 2+ and symmetric all extremities Skin: Skin color, texture, turgor normal, no rashes or lesions Neuro: CNII-XII intact. Normal strength, sensation and reflexes throughout   Lab Results:  [facility] 02/13/12 0507 02/12/12 2000  NA 146* 148*  K 3.7 2.7*  CL 108 109  CO2 27 29  GLUCOSE 144* 161*  BUN 18 19  CREATININE 0.91 0.95  CALCIUM 8.1* 8.5  MG 1.7 1.8  PHOS 3.1 2.6    Basename 02/11/12 1450 02/11/12 1140  AST -- 57*  ALT -- 49  ALKPHOS -- 116  BILITOT -- 0.5  PROT -- 6.2  ALBUMIN 2.2* 2.4*   No results found for this basename: LIPASE:2,AMYLASE:2 in the last 72 hours  Basename 02/13/12 0507 02/12/12 0340  WBC 13.6* 13.3*  NEUTROABS -- 10.6*  HGB 10.6* 10.3*  HCT 30.1* 28.6*  MCV 91.8 91.4  PLT 137* 94*   No results found  for this basename: CKTOTAL:3,CKMB:3,CKMBINDEX:3,TROPONINI:3 in the last 72 hours No components found with this basename: POCBNP:3 No results found for this basename: DDIMER:2 in the last 72 hours No results found for this basename: HGBA1C:2 in the last 72 hours No results found for this basename: CHOL:2,HDL:2,LDLCALC:2,TRIG:2,CHOLHDL:2,LDLDIRECT:2 in the last 72 hours No results found for this basename: TSH,T4TOTAL,FREET3,T3FREE,THYROIDAB in the last 72 hours No results found for this basename: VITAMINB12:2,FOLATE:2,FERRITIN:2,TIBC:2,IRON:2,RETICCTPCT:2 in the last 72 hours  Studies/Results: Dg Chest Port 1 View  02/13/2012  *RADIOLOGY REPORT*  Clinical Data: Evaluate ET tube placement  PORTABLE CHEST - 1 VIEW  Comparison: 02/12/2012  Findings: There is been interval extubation and removal of the nasogastric tube. The left IJ catheter tip remains and is in the projection of the innominate vein.  No pleural effusion or edema.  No airspace consolidation.  Review of the visualized osseous structures is unremarkable.  IMPRESSION:  1.  Status post extubation.   Original Report Authenticated By: Rosealee Albee, M.D.    Dg Chest Port 1 View  02/12/2012  *RADIOLOGY REPORT*  Clinical Data: Ventilator dependent respiratory failure.  PORTABLE CHEST - 1 VIEW  Comparison: 02/11/2012  Findings: Endotracheal tube has been pulled back with tip now 2 cm above carina.  Nasogastric tube and left jugular center venous catheter remain in appropriate position.  Low lung volumes  and mild bibasilar atelectasis show no significant change.  No evidence of pulmonary consolidation or edema.  Heart size is normal.  IMPRESSION:  1.  Low lung volumes and mild bibasilar atelectasis, without significant change. 2.  Endotracheal tube in appropriate position.   Original Report Authenticated By: Danae Orleans, M.D.    Ct Portable Head W/o Cm  02/11/2012  *RADIOLOGY REPORT*  Clinical Data: Cardiac arrest  CT HEAD WITHOUT CONTRAST   Technique:  Contiguous axial images were obtained from the base of the skull through the vertex without contrast.  Comparison: MRI 06/04/2011  Findings: Portable CT equipment was utilized.  Generalized atrophy.  Ventricle size is normal.  Negative for hemorrhage.  No acute infarct or mass.  IMPRESSION: No acute abnormality.   Original Report Authenticated By: Camelia Phenes, M.D.    Medications: Scheduled Meds:   . antiseptic oral rinse  15 mL Mouth Rinse q12n4p  . aspirin  81 mg Oral Daily  . chlorhexidine  15 mL Mouth Rinse BID  . furosemide  20 mg Intravenous Q6H  . furosemide  40 mg Intravenous Q8H  . insulin aspart  0-4 Units Subcutaneous Q4H  . magnesium sulfate 1 - 4 g bolus IVPB  2 g Intravenous Once  . vancomycin  500 mg Oral Q6H   And  . metronidazole  500 mg Intravenous Q8H  . multivitamin with minerals  1 tablet Oral Daily  . piperacillin-tazobactam (ZOSYN)  IV  3.375 g Intravenous Q8H  . potassium chloride  10 mEq Intravenous Q1 Hr x 6  . potassium chloride  40 mEq Oral TID  . potassium phosphate IVPB (mmol)  30 mmol Intravenous Once  . ranitidine  150 mg Per Tube Daily  . DISCONTD: feeding supplement  30 mL Per Tube BID  . DISCONTD: magnesium sulfate 1 - 4 g bolus IVPB  2 g Intravenous Once  . DISCONTD: vancomycin  1,250 mg Intravenous Q24H   Continuous Infusions:   . DISCONTD: feeding supplement (OXEPA) 1,000 mL (02/11/12 1615)   PRN Meds:.acetaminophen, acetaminophen, alum & mag hydroxide-simeth, dextrose, fentaNYL, haloperidol lactate, LORazepam, ondansetron (ZOFRAN) IV, DISCONTD: fentaNYL, DISCONTD: midazolam, DISCONTD: ondansetron  Assessment/Plan: Patient Active Problem List   Diagnosis Date Noted  . Delirium due to general medical condition - appears to be resolving slowly 02/11/2012  . Cardiac arrest - asystole - will likely need cardiac workup prior to discharge. 02/11/2012  . Acute respiratory failure - patient felt to have aspirated by pulmonary critical  care medicine reporting having aspirated a lot of fluid out of his trachea on intubation. Chest x-ray remained clear. Hypoxemia could have led to PEA requiring chest compressions 02/11/2012  . Aspiration pneumonia - never materialized on chest x-ray 02/11/2012  . Hypoxemia - resolved 02/11/2012  . ARDS (adult respiratory distress syndrome) - resolved 02/11/2012  . Anoxic encephalopathy - resolving 02/11/2012  . SIRS (systemic inflammatory response syndrome) - continue on antibiotics for Escherichia coli from probable prostatic source 02/09/2012  . UTI (lower urinary tract infection) 02/09/2012  . Abdominal distension - resolved 02/09/2012  . Septic shock - resolved 02/09/2012  . Seizures, generalized convulsive - continue to adjust Dilantin to achieve therapeutic levels 06/03/2011  . HTN (hypertension) - controlled 06/03/2011  . DM (diabetes mellitus), type 2 - controlled 06/03/2011  . Anemia 06/03/2011  . Thrombocytopenia Disposition - transferring to internal medicine service 06/03/2011     LOS: 4 days   Bertine Schlottman NEVILL 02/13/2012, 3:36 PM

## 2012-02-13 NOTE — Progress Notes (Signed)
02/13/2012 1:04 PM Pt arrived from 2100 with wife at bedside. Vss. No complaints of pain. Bed in low position and call bell with in reach. Safety discussed. Will continue to monitor. Celesta Gentile

## 2012-02-13 NOTE — Evaluation (Signed)
Clinical/Bedside Swallow Evaluation Patient Details  Name: Joseph Copeland MRN: [id number] Date of Birth: 12/26/1940  Today's Date: 02/13/2012 Time: 4098-1191 SLP Time Calculation (min): 37 min  Past Medical History:  Past Medical History  Diagnosis Date  . Hypertension   . Arthritis   . Seizures     first12/12-none since  . Diabetes mellitus     type 2-no meds  . Sleep apnea     nasal CPAP at HS   Past Surgical History:  Past Surgical History  Procedure Date  . Hemrhoidectomy   . Colonoscopy   . Mass excision 10/07/2011    Procedure: EXCISION MASS;  Surgeon: Louisa Second, MD;  Location: [facility];  Service: Plastics;  Laterality: Left;  excision of large mass on left back with possible lipo assistence  . Liposuction 10/07/2011    Procedure: LIPOSUCTION;  Surgeon: Louisa Second, MD;  Location: [facility];  Service: Plastics;  Laterality: Left;   HPI:  71yo male with hx HTN, seizure disorder, DM presented 8/18 with fevers (102.9), chills and ??seizure,  Per pts wife he has "not been himself" for 1-2 weeks with loss of appetite, diarrhea, chills and generalized weakness.  In ER, u/a was grossly positive for UTI, CXR clear and dilantin level low.  He was given dilantin, abx for UTI and admitted by [facility] to SDU.  However on admission to SDU he was noted to be significantly hypotensive with SBP 70's despite 3L fluids and PCCM consulted for septic shock and ICU tx.     Assessment / Plan / Recommendation Clinical Impression  Patient appears to have normal swallow function for all consistencies presented.  There was no delay to initiate the swallow, good laryngeal elevation was palpated, no cough or audible throat clearing observed, and voice quality was clear after swallows.  As SLP was leaving the room, the patient did cough, but reported it was not related to swallowing.  If MD is concerned that patient is silently aspirating (or has a significant delay  in cough response) please order an objective swallow evaluation.      Aspiration Risk  Mild    Diet Recommendation Regular;Thin liquid (Unless MD/RN feel patient is silently aspirating, then NPO.)   Liquid Administration via: Straw Medication Administration: Whole meds with liquid Supervision: Patient able to self feed;Full supervision/cueing for compensatory strategies (full supervision initially.) Compensations: Slow rate;Small sips/bites Postural Changes and/or Swallow Maneuvers: Seated upright 90 degrees    Other  Recommendations Oral Care Recommendations: Oral care QID;Staff/trained caregiver to provide oral care Other Recommendations: Clarify dietary restrictions (Carb modified (DM))   Follow Up Recommendations       Frequency and Duration min 1 x/week  2 weeks   Pertinent Vitals/Pain n/a    SLP Swallow Goals Patient will consume recommended diet without observed clinical signs of aspiration with: Supervision/safety;Set-up Patient will utilize recommended strategies during swallow to increase swallowing safety with: Supervision/safety   Swallow Study Prior Functional Status       General HPI: 71yo male with hx HTN, seizure disorder, DM presented 8/18 with fevers (102.9), chills and ??seizure,  Per pts wife he has "not been himself" for 1-2 weeks with loss of appetite, diarrhea, chills and generalized weakness.  In ER, u/a was grossly positive for UTI, CXR clear and dilantin level low.  He was given dilantin, abx for UTI and admitted by [facility] to SDU.  However on admission to SDU he was noted to be significantly hypotensive with SBP 70's despite  3L fluids and PCCM consulted for septic shock and ICU tx.   Type of Study: Bedside swallow evaluation Diet Prior to this Study: NPO Temperature Spikes Noted: No Respiratory Status: Room air History of Recent Intubation: Yes Length of Intubations (days): 1 days Date extubated: 02/12/12 Behavior/Cognition: Alert;Cooperative;Pleasant  mood Oral Cavity - Dentition: Adequate natural dentition Self-Feeding Abilities: Able to feed self;Needs set up Patient Positioning: Upright in bed Baseline Vocal Quality: Clear Volitional Cough: Strong Volitional Swallow: Able to elicit    Oral/Motor/Sensory Function Overall Oral Motor/Sensory Function: Appears within functional limits for tasks assessed   Ice Chips Ice chips: Within functional limits Presentation: Spoon   Thin Liquid Thin Liquid: Within functional limits Presentation: Spoon;Cup;Straw;Self Fed    Nectar Thick Nectar Thick Liquid: Not tested   Honey Thick Honey Thick Liquid: Not tested   Puree Puree: Within functional limits Presentation: Spoon;Self Fed   Solid   GO    Solid: Within functional limits Presentation: Self Daine Gravel, Lakeeta Dobosz T 02/13/2012,2:30 PM

## 2012-02-13 NOTE — Progress Notes (Signed)
[facility] NEUR[facility] HOSPITALIST PROGRESS NOTE    SUBJECTIVE   Pt extubated yesterday. Patient fully alert and oriented.  Has intermittent cough but no other significant symptoms. Denies any focal weakness or numbness.  Hasn't walked out of the bed yet. Denies any nausea vomiting, abdominal pain, chest pain, shortness of breath.   OBJECTIVE   Vital signs in last 24 hours: Temp:  [97.4 F (36.3 C)-99.1 F (37.3 C)] 97.4 F (36.3 C) (08/22 0842) Pulse Rate:  [9-100] 78  (08/22 0800) Resp:  [20-30] 29  (08/22 0800) BP: (82-116)/(54-74) 103/74 mmHg (08/22 0800) SpO2:  [98 %-100 %] 99 % (08/22 0800) Arterial Line BP: (89-124)/(55-69) 119/67 mmHg (08/22 0800) FiO2 (%):  [40 %] 40 % (08/21 1700) Weight:  [139 lb 1.8 oz (63.1 kg)] 139 lb 1.8 oz (63.1 kg) (08/22 0400)  Intake/Output from previous day: 08/21 0701 - 08/22 0700 In: 2247.5 [I.V.:125; NG/GT:360; IV Piggyback:1762.5] Out: 4098 [JXBJY:7829; Stool:200] Intake/Output this shift: Total I/O In: 50 [IV Piggyback:50] Out: 700 [Urine:400; Stool:300] Nutritional status: NPO  Past Medical History  Diagnosis Date  . Hypertension   . Arthritis   . Seizures     first12/12-none since  . Diabetes mellitus     type 2-no meds  . Sleep apnea     nasal CPAP at HS    Neurologic ROS negative with exception of above.  Neurologic Exam:   Alert and oriented x3. follows commands. 5 /5 strength bilaterally upper and lower extremities Sensory intact  No rigidity, no clonus    Lab Results: Lab Results  Component Value Date/Time   CHOL 223* 12/28/2010  7:05 AM   Studies/Results: Dg Chest Port 1 View  02/13/2012  *RADIOLOGY REPORT*  Clinical Data: Evaluate ET tube placement  PORTABLE CHEST - 1 VIEW  Comparison: 02/12/2012  Findings: There is been interval extubation and removal of the nasogastric tube. The left IJ catheter tip remains and is in the projection of the innominate vein.  No pleural  effusion or edema.  No airspace consolidation.  Review of the visualized osseous structures is unremarkable.  IMPRESSION:  1.  Status post extubation.   Original Report Authenticated By: Rosealee Albee, M.D.    Dg Chest Port 1 View  02/12/2012  *RADIOLOGY REPORT*  Clinical Data: Ventilator dependent respiratory failure.  PORTABLE CHEST - 1 VIEW  Comparison: 02/11/2012  Findings: Endotracheal tube has been pulled back with tip now 2 cm above carina.  Nasogastric tube and left jugular center venous catheter remain in appropriate position.  Low lung volumes and mild bibasilar atelectasis show no significant change.  No evidence of pulmonary consolidation or edema.  Heart size is normal.  IMPRESSION:  1.  Low lung volumes and mild bibasilar atelectasis, without significant change. 2.  Endotracheal tube in appropriate position.   Original Report Authenticated By: Danae Orleans, M.D.    Dg Chest Port 1 View  02/11/2012  *RADIOLOGY REPORT*  Clinical Data: 71 year old male endotracheal tube advancement.  PORTABLE CHEST - 1 VIEW  Comparison: 1202 hours the same day earlier.  Findings: AP portable semi upright view at 1403 hours. Endotracheal tube tip now above the carina.  This should be retracted for more optimal place.  Stable left IJ central line.  Enteric tube now in place, looped  in the gastric body region.  Stable lung volumes.  Mildly improved bibasilar ventilation.  No confluent opacity, pneumothorax, edema, or definite effusion. Stable cardiac size and mediastinal contours.  IMPRESSION:  1.  Endotracheal tube tip now above the carina.  Withdraw 2 cm for optimal placement. 2.  Enteric tube placed, looped at the gastric body. 3. No acute cardiopulmonary abnormality.   Original Report Authenticated By: Harley Hallmark, M.D.    Dg Chest Port 1 View  02/11/2012  *RADIOLOGY REPORT*  Clinical Data: Intubation.  PORTABLE CHEST - 1 VIEW  Comparison: 02/09/2012  Findings: An endotracheal tube is in place, with tip  2.9 cm above the carina.  A left internal jugular line has been placed.  Its tip is oriented transversely and projects over the SVC.  Low lung volumes noted with mild cardiomegaly and thoracic spondylosis.  Linear subsegmental atelectasis noted at the right lung base.  IMPRESSION:  1.  ET tube tip 2.9 cm above the carina. 2.  Left IJ line tip projects over the SVC.  No pneumothorax observed. 3.  Low lung volumes. 4.  Mild atelectasis at the right lung base.   Original Report Authenticated By: Dellia Cloud, M.D.    Ct Portable Head W/o Cm  02/11/2012  *RADIOLOGY REPORT*  Clinical Data: Cardiac arrest  CT HEAD WITHOUT CONTRAST  Technique:  Contiguous axial images were obtained from the base of the skull through the vertex without contrast.  Comparison: MRI 06/04/2011  Findings: Portable CT equipment was utilized.  Generalized atrophy.  Ventricle size is normal.  Negative for hemorrhage.  No acute infarct or mass.  IMPRESSION: No acute abnormality.   Original Report Authenticated By: Camelia Phenes, M.D.     Medications:    I have reviewed the patient's current medications.  Assessment/Plan:    Patient Active Problem List  Diagnosis  . Seizures, generalized convulsive  . HTN (hypertension)  . DM (diabetes mellitus), type 2  . Anemia  . Thrombocytopenia  . SIRS (systemic inflammatory response syndrome)  . UTI (lower urinary tract infection)  . Abdominal distension  . Septic shock  . Delirium due to general medical condition  . Cardiac arrest - asystole  . Acute respiratory failure  . Aspiration pneumonia  . Hypoxemia  . ARDS (adult respiratory distress syndrome)  . Anoxic encephalopathy   Gen. Seizure/ Acute Encephalopathy/Delirium: Patient still intubated. CT head portable negative for acute changes. Dilantin level -  corrected 27< 31. - Continue hold Dilantin for today and recheck levels tomorrow. - MRI brain not needed any more as patient's mental status cleared post  extubation. - PT/OT and transferred to step down per CCM. - Discussed with Dr. Roseanne Reno.  PATEL,RAVI MD  02/13/2012, 11:06 AM

## 2012-02-13 NOTE — Progress Notes (Signed)
ANTIBIOTIC CONSULT NOTE - FOLLOW UP  Pharmacy Consult for Zosyn  Indication: UTI/Aspiration PNA  No Known Allergies  Patient Measurements: Height: 5\' 6"  (167.6 cm) Weight: 139 lb 1.8 oz (63.1 kg) IBW/kg (Calculated) : 63.8   Vital Signs: Temp: 98.8 F (37.1 C) (08/22 1300) Temp src: Oral (08/22 1300) BP: 121/66 mmHg (08/22 1324) Pulse Rate: 84  (08/22 1324) Intake/Output from previous day: 08/21 0701 - 08/22 0700 In: 2247.5 [I.V.:125; NG/GT:360; IV Piggyback:1762.5] Out: 1610 [RUEAV:4098; Stool:200] Intake/Output from this shift: Total I/O In: 434 [IV Piggyback:434] Out: 1700 [Urine:1400; Stool:300]  Labs:  Basename 02/13/12 0507 02/12/12 2000 02/12/12 0340 02/11/12 1140  WBC 13.6* -- 13.3* 31.6*  HGB 10.6* -- 10.3* 10.7*  PLT 137* -- 94* 100*  LABCREA -- -- -- --  CREATININE 0.91 0.95 0.91 --   Estimated Creatinine Clearance: 67.4 ml/min (by C-G formula based on Cr of 0.91). No results found for this basename: VANCOTROUGH:2,VANCOPEAK:2,VANCORANDOM:2,GENTTROUGH:2,GENTPEAK:2,GENTRANDOM:2,TOBRATROUGH:2,TOBRAPEAK:2,TOBRARND:2,AMIKACINPEAK:2,AMIKACINTROU:2,AMIKACIN:2, in the last 72 hours   Microbiology: Recent Results (from the past 720 hour(s))  URINE CULTURE     Status: Normal   Collection Time   02/09/12  8:52 AM      Component Value Range Status Comment   Specimen Description URINE, CLEAN CATCH   Final    Special Requests ADDED ON 02/09/12 AT 1050   Final    Culture  Setup Time 02/09/2012 16:54   Final    Colony Count >=100,000 COLONIES/ML   Final    Culture ESCHERICHIA COLI   Final    Report Status 02/11/2012 FINAL   Final    Organism ID, Bacteria ESCHERICHIA COLI   Final   CULTURE, BLOOD (ROUTINE X 2)     Status: Normal (Preliminary result)   Collection Time   02/09/12 10:41 AM      Component Value Range Status Comment   Specimen Description BLOOD LEFT ARM   Final    Special Requests BOTTLES DRAWN AEROBIC AND ANAEROBIC 10CC   Final    Culture  Setup Time  02/09/2012 16:55   Final    Culture     Final    Value:        BLOOD CULTURE RECEIVED NO GROWTH TO DATE CULTURE WILL BE HELD FOR 5 DAYS BEFORE ISSUING A FINAL NEGATIVE REPORT   Report Status PENDING   Incomplete   CULTURE, BLOOD (ROUTINE X 2)     Status: Normal (Preliminary result)   Collection Time   02/09/12 10:46 AM      Component Value Range Status Comment   Specimen Description BLOOD LEFT HAND   Final    Special Requests BOTTLES DRAWN AEROBIC AND ANAEROBIC 10CC   Final    Culture  Setup Time 02/09/2012 16:55   Final    Culture     Final    Value:        BLOOD CULTURE RECEIVED NO GROWTH TO DATE CULTURE WILL BE HELD FOR 5 DAYS BEFORE ISSUING A FINAL NEGATIVE REPORT   Report Status PENDING   Incomplete   MRSA PCR SCREENING     Status: Normal   Collection Time   02/09/12 12:49 PM      Component Value Range Status Comment   MRSA by PCR NEGATIVE  NEGATIVE Final   CLOSTRIDIUM DIFFICILE BY PCR     Status: Abnormal   Collection Time   02/09/12  3:18 PM      Component Value Range Status Comment   C difficile by pcr POSITIVE (*) NEGATIVE Final  Anti-infectives     Start     Dose/Rate Route Frequency Ordered Stop   02/10/12 1000   cefTRIAXone (ROCEPHIN) 1 g in dextrose 5 % 50 mL IVPB  Status:  Discontinued        1 g 100 mL/hr over 30 Minutes Intravenous Every 24 hours 02/09/12 1241 02/09/12 1335   02/10/12 0800   vancomycin (VANCOCIN) 1,250 mg in sodium chloride 0.9 % 250 mL IVPB  Status:  Discontinued        1,250 mg 166.7 mL/hr over 90 Minutes Intravenous Every 24 hours 02/09/12 1421 02/13/12 0904   02/09/12 2100   cefTRIAXone (ROCEPHIN) 2 g in dextrose 5 % 50 mL IVPB  Status:  Discontinued        2 g 100 mL/hr over 30 Minutes Intravenous Every 24 hours 02/09/12 1421 02/09/12 1525   02/09/12 2000   metroNIDAZOLE (FLAGYL) IVPB 500 mg        500 mg 100 mL/hr over 60 Minutes Intravenous Every 8 hours 02/09/12 1804 02/23/12 1959   02/09/12 1815   vancomycin (VANCOCIN) 50 mg/mL  oral solution 500 mg        500 mg Oral 4 times per day 02/09/12 1804 02/23/12 1759   02/09/12 1630  piperacillin-tazobactam (ZOSYN) IVPB 3.375 g       3.375 g 12.5 mL/hr over 240 Minutes Intravenous Every 8 hours 02/09/12 1528     02/09/12 1245   cefTRIAXone (ROCEPHIN) 1 g in dextrose 5 % 50 mL IVPB  Status:  Discontinued        1 g 100 mL/hr over 30 Minutes Intravenous Every 24 hours 02/09/12 1238 02/09/12 1241   02/09/12 1200   vancomycin (VANCOCIN) IVPB 1000 mg/200 mL premix  Status:  Discontinued        1,000 mg 200 mL/hr over 60 Minutes Intravenous Every 24 hours 02/09/12 1118 02/09/12 1420   02/09/12 0945   cefTRIAXone (ROCEPHIN) 1 g in dextrose 5 % 50 mL IVPB        1 g 100 mL/hr over 30 Minutes Intravenous  Once 02/09/12 0933 02/09/12 1020          Assessment: Pt is a 71 YOM who presented with breakthrough seizure, fever, and chills. WBC 13.6<13.3, Tmax 99.1. Initially treated for septic shock with vanco/zosyn. C diff positive: treatment  with vanco po and IV flagyl. On 8/20 pt experienced cardiac arrest and aspiration. CXR from 8/21 shows no evidence of pulmonary consolidation. Scr 0.91. CrCl ~ 60-64ml/min.   8/19 Zosyn >> 8/19 Vanco PO >> 8/19 Flagyl >> 8/18 Vanco >> 8/22 8/18 Rocephin >> 8/18  8/18 Urine culture>> e coli (sensitive to all but ampicillin) 8/18 C diff pos.   Goal of Therapy:  Eradication of infection  Plan:  1. Continue Zosyn 3.375G IV q8h to be infused over 4 hours 2. Monitor renal function 3. Continue vanco po/flagyl for c diff 4. F/U LOT with ABX (possible LOT 7-8 days)  Joseph Copeland, PharmD Clinical Pharmacist Phone: [phone] Pager: [phone] 02/13/2012 4:49 PM

## 2012-02-14 DIAGNOSIS — A4189 Other specified sepsis: Secondary | ICD-10-CM

## 2012-02-14 DIAGNOSIS — A0472 Enterocolitis due to Clostridium difficile, not specified as recurrent: Secondary | ICD-10-CM | POA: Diagnosis not present

## 2012-02-14 DIAGNOSIS — R5381 Other malaise: Secondary | ICD-10-CM

## 2012-02-14 LAB — HEPARIN INDUCED THROMBOCYTOPENIA PNL
Heparin Induced Plt Ab: NEGATIVE
UFH Low Dose 0.1 IU/mL: 0 % Release
UFH Low Dose 0.5 IU/mL: 0 % Release
UFH SRA Result: NEGATIVE

## 2012-02-14 LAB — GLUCOSE, CAPILLARY
Glucose-Capillary: 109 mg/dL — ABNORMAL HIGH (ref 70–99)
Glucose-Capillary: 136 mg/dL — ABNORMAL HIGH (ref 70–99)

## 2012-02-14 LAB — CBC
HCT: 31.7 % — ABNORMAL LOW (ref 39.0–52.0)
Hemoglobin: 10.8 g/dL — ABNORMAL LOW (ref 13.0–17.0)
MCH: 32 pg (ref 26.0–34.0)
RBC: 3.37 MIL/uL — ABNORMAL LOW (ref 4.22–5.81)

## 2012-02-14 LAB — BASIC METABOLIC PANEL
BUN: 14 mg/dL (ref 6–23)
CO2: 28 mEq/L (ref 19–32)
Glucose, Bld: 133 mg/dL — ABNORMAL HIGH (ref 70–99)
Potassium: 3.6 mEq/L (ref 3.5–5.1)
Sodium: 140 mEq/L (ref 135–145)

## 2012-02-14 LAB — ALBUMIN: Albumin: 2.2 g/dL — ABNORMAL LOW (ref 3.5–5.2)

## 2012-02-14 LAB — PHENYTOIN LEVEL, TOTAL: Phenytoin Lvl: 11.3 ug/mL (ref 10.0–20.0)

## 2012-02-14 MED ORDER — CEPHALEXIN 500 MG PO CAPS
500.0000 mg | ORAL_CAPSULE | Freq: Three times a day (TID) | ORAL | Status: DC
  Administered 2012-02-14 – 2012-02-18 (×13): 500 mg via ORAL
  Filled 2012-02-14 (×16): qty 1

## 2012-02-14 MED ORDER — METRONIDAZOLE 500 MG PO TABS
500.0000 mg | ORAL_TABLET | Freq: Three times a day (TID) | ORAL | Status: DC
  Administered 2012-02-14 – 2012-02-15 (×4): 500 mg via ORAL
  Filled 2012-02-14 (×7): qty 1

## 2012-02-14 MED FILL — Medication: Qty: 1 | Status: AC

## 2012-02-14 NOTE — Progress Notes (Signed)
Report called to farah rn pt going to [phone] via w/c with belongings and family. Joseph Copeland

## 2012-02-14 NOTE — Progress Notes (Signed)
[facility]IST PROGRESS NOTE    SUBJECTIVE   Pt extubated 8/21. Patient fully alert and oriented.  Has no new seizures  Or neurological changes. Denies any nausea vomiting, abdominal pain, chest pain, shortness of breath. PT/OT started.  OBJECTIVE   Vital signs in last 24 hours: Temp:  [98.4 F (36.9 C)-100 F (37.8 C)] 99.5 F (37.5 C) (08/23 1200) Pulse Rate:  [78-91] 84  (08/23 1200) Resp:  [20-33] 20  (08/23 1200) BP: (98-123)/(59-79) 105/69 mmHg (08/23 1200) SpO2:  [93 %-100 %] 98 % (08/23 1200)  Intake/Output from previous day: 08/22 0701 - 08/23 0700 In: 746.5 [IV Piggyback:746.5] Out: 3500 [Urine:3200; Stool:300] Intake/Output this shift: Total I/O In: 372.5 [P.O.:360; IV Piggyback:12.5] Out: -  Nutritional status: Carb Control  Past Medical History  Diagnosis Date  . Hypertension   . Arthritis   . Seizures     first12/12-none since  . Diabetes mellitus     type 2-no meds  . Sleep apnea     nasal CPAP at HS    Neurologic ROS negative with exception of above.  Neurologic Exam:   Alert and oriented x3. follows commands. 5 /5 strength bilaterally upper and lower extremities Sensory intact  No rigidity, no clonus  General: resting in bed  HEENT: PERRL, EOMI, no scleral icterus Cardiac: RRR, no rubs, murmurs or gallops Pulm: clear to auscultation bilaterally, moving normal volumes of air Abd: soft, nontender, nondistended, BS present Ext: warm and well perfused, no pedal edema   Lab Results: Lab Results  Component Value Date/Time   CHOL 223* 12/28/2010  7:05 AM   Studies/Results: Dg Chest Port 1 View  02/13/2012  *RADIOLOGY REPORT*  Clinical Data: Evaluate ET tube placement  PORTABLE CHEST - 1 VIEW  Comparison: 02/12/2012  Findings: There is been interval extubation and removal of the nasogastric tube. The left IJ catheter tip remains and is in the projection of the innominate vein.  No pleural  effusion or edema.  No airspace consolidation.  Review of the visualized osseous structures is unremarkable.  IMPRESSION:  1.  Status post extubation.   Original Report Authenticated By: Rosealee Albee, M.D.     Medications:    I have reviewed the patient's current medications.  Assessment/Plan:    Patient Active Problem List  Diagnosis  . Seizures, generalized convulsive  . HTN (hypertension)  . DM (diabetes mellitus), type 2  . Anemia  . Thrombocytopenia  . SIRS (systemic inflammatory response syndrome)  . UTI (lower urinary tract infection)  . Abdominal distension  . Septic shock  . Delirium due to general medical condition  . Cardiac arrest - asystole  . Acute respiratory failure  . Aspiration pneumonia  . Hypoxemia  . ARDS (adult respiratory distress syndrome)  . Anoxic encephalopathy  . C. difficile diarrhea   Gen. Seizure/ Acute Encephalopathy/Delirium: Patient still intubated. CT head portable negative for acute changes. Dilantin level -  corrected 20.9< 27< 31. - Still Continue hold Dilantin for today and recheck levels tomorrow and dose per pharmacy. - MRI brain not needed any more as patient's mental status cleared post extubation. - PT/OT started and Inpatient rehab evaluation initiated. - Discussed with Dr. Roseanne Reno. Will sign off. Please feel free to call for further questions if needed.  PATEL,RAVI  MD  02/14/2012, 12:51 PM

## 2012-02-14 NOTE — Progress Notes (Signed)
Speech Language Pathology Dysphagia Treatment Patient Details Name: Joseph Copeland MRN: [id number] DOB: 08-28-1940 Today's Date: 02/14/2012 Time: 1230-1300 SLP Time Calculation (min): 30 min  Assessment / Plan / Recommendation Clinical Impression  Purpose of diagnostic treatment follow up for diet tolerance of regular consistency and thin liquids following initial BSE completed on 02/13/12.  Patient noted with cough while eating  noon meal when treating SLP entered room .  Patient reports he coughs intermittently even when not eating.  Patient's spouse reports he coughs and clears his throat at home.  Hard, wet cough noted x1 immediately s/p swallow of thin liquids by cup/straw possibly due to noted delay in swallow .  S/s observed more esophageal based as patient noted with slight increase WOB and some discomfort s/p swallow of regular  solids .  Patient reports he has to "eat slow and take frequent breaks"  when eating solids.  Strategies of alternating solids with sips somewhat effective in eliminating globus sensation.  Initial POC d/c after diet check, however, recommend for ST to follow  1x on 02/15/12 for diet tolerance and  to provide education to patient and caregivers on aspiration and reflux precautions to increase safety of the swallow. Completion of objective evaluation TBD.    Please note ST received discontinue order for BSE but patient is on ST caseload for diet tolerance as initial BSE was completed on 02/13/12.    Diet Recommendation  Continue with Current Diet: Regular;Thin liquid    SLP Plan Continue with current plan of care      Swallowing Goals  SLP Swallowing Goals Patient will consume recommended diet without observed clinical signs of aspiration with: Supervision/safety Swallow Study Goal #1 - Progress: Progressing toward goal Patient will utilize recommended strategies during swallow to increase swallowing safety with: Supervision/safety Swallow Study Goal #2 - Progress:  Progressing toward goal  General Temperature Spikes Noted: No Respiratory Status: Room air Behavior/Cognition: Alert;Cooperative;Pleasant mood Oral Cavity - Dentition: Adequate natural dentition Patient Positioning: Upright in bed  Oral Cavity - Oral Hygiene Does patient have any of the following "at risk" factors?: None of the above Brush patient's teeth BID with toothbrush (using toothpaste with fluoride): Yes   Dysphagia Treatment Treatment focused on: Skilled observation of diet tolerance;Patient/family/caregiver education;Facilitation of pharyngeal phase Family/Caregiver Educated: Spouse and daughter Treatment Methods/Modalities: Skilled observation;Differential diagnosis;Effortful swallow Patient observed directly with PO's: Yes Type of PO's observed: Regular;Thin liquids;Dysphagia 3 (soft) Feeding: Able to feed self Liquids provided via: Cup;Straw Pharyngeal Phase Signs & Symptoms: Delayed cough;Suspected delayed swallow initiation;Immediate cough Type of cueing: Verbal Amount of cueing: Minimal   Moreen Fowler MS, CCC-SLP [phone]     [facility] 02/14/2012, 1:15 PM

## 2012-02-14 NOTE — Consult Note (Signed)
Physical Medicine and Rehabilitation Consult Reason for Consult: Deconditioning/cardiac arrest Referring Physician: Dr. Kevan Ny   HPI: Joseph Copeland is a 71 y.o. right-handed male with history of hypertension, seizure disorder maintained with Dilantin and diabetes mellitus with peripheral neuropathy. Admitted 02/09/2012 with fever, chills questionable seizure. Wife had noted altered mental status and loss of appetite over the last couple of weeks with generalized weakness. Cranial CT scan negative for acute changes. Dilantin level of 6.9. Noted hypotension with systolic blood pressure in the 70s and placed on intravenous fluids. Chest x-ray was unremarkable. Critical care medicine consulted for septic shock. Noted on 02/11/2012 patient became unresponsive no pulse CODE BLUE was called patient transferred to unit 2100 and was intubated. Dilantin doses were adjusted with latest Dilantin level of 11.3 per neurology services. Urinalysis study and Escherichia coli placed on Keflex. Patient was extubated on 02/12/2012. Her diet was slowly advanced with followup per speech therapy. C. difficile specimen positive placed on Flagyl and contact precautions. Await physical occupational therapy evaluations. M.D. is requested physical medicine rehabilitation consult to consider inpatient rehabilitation services   Review of Systems  Constitutional: Positive for fever and chills.  Gastrointestinal: Positive for constipation.  Neurological: Positive for weakness.  All other systems reviewed and are negative.   Past Medical History  Diagnosis Date  . Hypertension   . Arthritis   . Seizures     first12/12-none since  . Diabetes mellitus     type 2-no meds  . Sleep apnea     nasal CPAP at [facility]   Past Surgical History  Procedure Date  . Hemrhoidectomy   . Colonoscopy   . Mass excision 10/07/2011    Procedure: EXCISION MASS;  Surgeon: Louisa Second, MD;  Location: [facility];  Service:  Plastics;  Laterality: Left;  excision of large mass on left back with possible lipo assistence  . Liposuction 10/07/2011    Procedure: LIPOSUCTION;  Surgeon: Louisa Second, MD;  Location:  SURGERY CENTER;  Service: Plastics;  Laterality: Left;   History reviewed. No pertinent family history. Social History:  reports that he quit smoking about 45 years ago. He has never used smokeless tobacco. He reports that he drinks about 12.6 ounces of alcohol per week. He reports that he does not use illicit drugs. Allergies: No Known Allergies Medications Prior to Admission  Medication Sig Dispense Refill  . aspirin 81 MG chewable tablet Chew 81 mg by mouth daily.      . Cyanocobalamin (VITAMIN B 12 PO) Take 1 tablet by mouth daily.        . fish oil-omega-3 fatty acids 1000 MG capsule Take 2 g by mouth daily.        Marland Kitchen ibuprofen (ADVIL,MOTRIN) 200 MG tablet Take 200 mg by mouth every 6 (six) hours as needed. For pain      . metoprolol tartrate (LOPRESSOR) 25 MG tablet Take 25 mg by mouth daily.      . Multiple Vitamin (MULTIVITAMIN WITH MINERALS) TABS Take 1 tablet by mouth daily.      . phenytoin (DILANTIN) 100 MG ER capsule Take 200 mg by mouth at bedtime. Total dose =250mg       . phenytoin (DILANTIN) 50 MG tablet Chew 50 mg by mouth at bedtime. Takes with 2 100mg  capsules      . testosterone (ANDROGEL) 50 MG/5GM GEL Place 5 g onto the skin daily.        Home: Home Living Lives With: Spouse Available Help at Discharge: Family  Type of Home: House Home Access: Stairs to enter [employer] of Steps: 2 Home Layout: One level Bathroom Shower/Tub: Engineer, manufacturing systems: Standard Home Adaptive Equipment: Straight cane  Functional History: Prior Function Meal Prep: Maximal Light Housekeeping: Maximal Able to Take Stairs?: Yes Driving: Yes Vocation: Part time employment Functional Status:  Mobility: Bed Mobility Bed Mobility: Supine to Sit Supine to Sit: 4: Min  assist;HOB elevated;With rails (HOB 30degrees) Transfers Transfers: Sit to Stand;Stand to Sit Sit to Stand: 4: Min assist;From bed Stand to Sit: 4: Min assist;To chair/3-in-1;With armrests Ambulation/Gait Ambulation/Gait Assistance: 3: Mod assist (+1 for lines and safety) Ambulation Distance (Feet): 18 Feet Assistive device: Rolling walker Ambulation/Gait Assistance Details: Pt pushing RW too far anteiorly despite assist and max cueing to reposition RW, pt maintaining shuffled gait with posterior lean. Pt able to state that he was aware his gait was different but could not correct Gait Pattern: Shuffle;Narrow base of support;Decreased trunk rotation Gait velocity: decreased Stairs: No    ADL:    Cognition: Cognition Arousal/Alertness: Awake/alert Orientation Level: Oriented X4 Cognition Overall Cognitive Status: Impaired Area of Impairment: Problem solving;Memory Arousal/Alertness: Awake/alert Orientation Level: Appears intact for tasks assessed Behavior During Session: [facility] for tasks performed Memory Deficits: pt with decreased ability to state home setup Cognition - Other Comments: pt with disjointed throughts at times during session  Blood pressure 115/79, pulse 82, temperature 98.9 F (37.2 C), temperature source Oral, resp. rate 33, height 5\' 6"  (1.676 m), weight 63.1 kg (139 lb 1.8 oz), SpO2 93.00%. Physical Exam  Vitals reviewed. Constitutional: He is oriented to person, place, and time. He appears well-developed.  HENT:  Head: Normocephalic.  Eyes:       Pupils reactive to light  Neck: Neck supple. No thyromegaly present.  Cardiovascular: Normal rate and regular rhythm.   Pulmonary/Chest: Breath sounds normal. No respiratory distress. He has no wheezes.  Abdominal: Bowel sounds are normal. He exhibits no distension. There is no tenderness.  Musculoskeletal: He exhibits no edema.  Neurological: He is alert and oriented to person, place, and time.       Cognitively  appropriate. Strength 5/5. Normal sensory  Skin: Skin is warm and dry.  Psychiatric: He has a normal mood and affect.    Results for orders placed during the hospital encounter of 02/09/12 (from the past 24 hour(s))  GLUCOSE, CAPILLARY     Status: Abnormal   Collection Time   02/13/12 11:30 AM      Component Value Range   Glucose-Capillary 200 (*) 70 - 99 mg/dL  GLUCOSE, CAPILLARY     Status: Abnormal   Collection Time   02/13/12  3:12 PM      Component Value Range   Glucose-Capillary 186 (*) 70 - 99 mg/dL   Comment 1 Notify RN     Comment 2 Documented in Chart    GLUCOSE, CAPILLARY     Status: Abnormal   Collection Time   02/13/12  7:41 PM      Component Value Range   Glucose-Capillary 115 (*) 70 - 99 mg/dL   Comment 1 Documented in Chart     Comment 2 Notify RN    GLUCOSE, CAPILLARY     Status: Abnormal   Collection Time   02/13/12 10:16 PM      Component Value Range   Glucose-Capillary 123 (*) 70 - 99 mg/dL   Comment 1 Documented in Chart     Comment 2 Notify RN    MAGNESIUM  Status: Normal   Collection Time   02/14/12  5:00 AM      Component Value Range   Magnesium 1.6  1.5 - 2.5 mg/dL  CBC     Status: Abnormal   Collection Time   02/14/12  5:00 AM      Component Value Range   WBC 15.3 (*) 4.0 - 10.5 K/uL   RBC 3.37 (*) 4.22 - 5.81 MIL/uL   Hemoglobin 10.8 (*) 13.0 - 17.0 g/dL   HCT 16.1 (*) 09.6 - 04.5 %   MCV 94.1  78.0 - 100.0 fL   MCH 32.0  26.0 - 34.0 pg   MCHC 34.1  30.0 - 36.0 g/dL   RDW 40.9 (*) 81.1 - 91.4 %   Platelets 207  150 - 400 K/uL  BASIC METABOLIC PANEL     Status: Abnormal   Collection Time   02/14/12  5:00 AM      Component Value Range   Sodium 140  135 - 145 mEq/L   Potassium 3.6  3.5 - 5.1 mEq/L   Chloride 103  96 - 112 mEq/L   CO2 28  19 - 32 mEq/L   Glucose, Bld 133 (*) 70 - 99 mg/dL   BUN 14  6 - 23 mg/dL   Creatinine, Ser 7.82  0.50 - 1.35 mg/dL   Calcium 8.4  8.4 - 95.6 mg/dL   GFR calc non Af Amer 84 (*) >90 mL/min   GFR calc  Af Amer >90  >90 mL/min  PHOSPHORUS     Status: Normal   Collection Time   02/14/12  5:00 AM      Component Value Range   Phosphorus 2.7  2.3 - 4.6 mg/dL  PHENYTOIN LEVEL, TOTAL     Status: Normal   Collection Time   02/14/12  5:00 AM      Component Value Range   Phenytoin Lvl 11.3  10.0 - 20.0 ug/mL  ALBUMIN     Status: Abnormal   Collection Time   02/14/12  5:00 AM      Component Value Range   Albumin 2.2 (*) 3.5 - 5.2 g/dL  GLUCOSE, CAPILLARY     Status: Abnormal   Collection Time   02/14/12  7:08 AM      Component Value Range   Glucose-Capillary 137 (*) 70 - 99 mg/dL   Comment 1 Notify RN     Comment 2 Documented in Chart     Dg Chest Port 1 View  02/13/2012  *RADIOLOGY REPORT*  Clinical Data: Evaluate ET tube placement  PORTABLE CHEST - 1 VIEW  Comparison: 02/12/2012  Findings: There is been interval extubation and removal of the nasogastric tube. The left IJ catheter tip remains and is in the projection of the innominate vein.  No pleural effusion or edema.  No airspace consolidation.  Review of the visualized osseous structures is unremarkable.  IMPRESSION:  1.  Status post extubation.   Original Report Authenticated By: Rosealee Albee, M.D.     Assessment/Plan: Diagnosis: deconditioning after sepsis 1. Does the need for close, 24 hr/day medical supervision in concert with the patient's rehab needs make it unreasonable for this patient to be served in a less intensive setting? Yes 2. Co-Morbidities requiring supervision/potential complications: htn, dm, sz 3. Due to bladder management, bowel management, safety, skin/wound care, disease management, medication administration, pain management and patient education, does the patient require 24 hr/day rehab nursing? Yes and No 4. Does the patient require coordinated care  of a physician, rehab nurse, pt and ot to address physical and functional deficits in the context of the above medical diagnosis(es)? No Addressing deficits in the  following areas: balance, endurance, locomotion, strength, transferring, bowel/bladder control, bathing, dressing, feeding, grooming, toileting, cognition and speech 5. Can the patient actively participate in an intensive therapy program of at least 3 hrs of therapy per day at least 5 days per week? Yes 6. The potential for patient to make measurable gains while on inpatient rehab is fair 7. Anticipated functional outcomes upon discharge from inpatient rehab are n/a. 8. Estimated rehab length of stay to reach the above functional goals is: n/a 9. Does the patient have adequate social supports to accommodate these discharge functional goals? Potentially 10. Anticipated D/C setting: Home 11. Anticipated post D/C treatments: HH therapy 12. Overall Rehab/Functional Prognosis: excellent  RECOMMENDATIONS: This patient's condition is appropriate for continued rehabilitative care in the following setting: [facility] PT Patient has agreed to participate in recommended program. Yes Note that insurance prior authorization may be required for reimbursement for recommended care.  Comment:  Ivory Broad, MD  02/14/2012

## 2012-02-14 NOTE — Progress Notes (Addendum)
Subjective: [name] is feeling much better.  Still having some diarrhea from C. difficile colitis.  He knows he is at [facility] and mental status has cleared greatly.  No complaints of pain.  Does not remember his cardiac arrest  Objective: Weight change:   Intake/Output Summary (Last 24 hours) at 02/14/12 0806 Last data filed at 02/14/12 0600  Gross per 24 hour  Intake    684 ml  Output   2875 ml  Net  -2191 ml   Filed Vitals:   02/13/12 1941 02/13/12 1942 02/13/12 2311 02/14/12 0331  BP: 106/65  112/59 115/66  Pulse: 90  89 81  Temp:  100 F (37.8 C) 98.4 F (36.9 C) 99.2 F (37.3 C)  TempSrc:  Oral Oral Oral  Resp: 23  26 25   Height:      Weight:      SpO2: 99%  95% 97%   General Appearance: Alert, cooperative, no distress, appears stated age Head: Normocephalic, without obvious abnormality, atraumatic Neck: Supple, symmetrical Lungs: Clear to auscultation bilaterally, respirations unlabored Heart: Regular rate and rhythm, S1 and S2 normal, no murmur, rub or gallop Abdomen: Soft, non-tender, bowel sounds active all four quadrants, no masses, no organomegaly Extremities: Extremities normal, atraumatic, no cyanosis or edema Pulses: 2+ and symmetric all extremities Skin: Skin color, texture, turgor normal, no rashes or lesions Neuro: CNII-XII intact. Normal strength, sensation and reflexes throughout   Lab Results:  Basename 02/14/12 0500 02/13/12 0507  NA 140 146*  K 3.6 3.7  CL 103 108  CO2 28 27  GLUCOSE 133* 144*  BUN 14 18  CREATININE 0.91 0.91  CALCIUM 8.4 8.1*  MG 1.6 1.7  PHOS 2.7 3.1    Basename 02/14/12 0500 02/11/12 1450 02/11/12 1140  AST -- -- 57*  ALT -- -- 49  ALKPHOS -- -- 116  BILITOT -- -- 0.5  PROT -- -- 6.2  ALBUMIN 2.2* 2.2* --   No results found for this basename: LIPASE:2,AMYLASE:2 in the last 72 hours  Basename 02/14/12 0500 02/13/12 0507 02/12/12 0340  WBC 15.3* 13.6* --  NEUTROABS -- -- 10.6*  HGB 10.8* 10.6* --   HCT 31.7* 30.1* --  MCV 94.1 91.8 --  PLT 207 137* --   No results found for this basename: CKTOTAL:3,CKMB:3,CKMBINDEX:3,TROPONINI:3 in the last 72 hours No components found with this basename: POCBNP:3 No results found for this basename: DDIMER:2 in the last 72 hours No results found for this basename: HGBA1C:2 in the last 72 hours No results found for this basename: CHOL:2,HDL:2,LDLCALC:2,TRIG:2,CHOLHDL:2,LDLDIRECT:2 in the last 72 hours No results found for this basename: TSH,T4TOTAL,FREET3,T3FREE,THYROIDAB in the last 72 hours No results found for this basename: VITAMINB12:2,FOLATE:2,FERRITIN:2,TIBC:2,IRON:2,RETICCTPCT:2 in the last 72 hours  Studies/Results: Dg Chest Port 1 View  02/13/2012  *RADIOLOGY REPORT*  Clinical Data: Evaluate ET tube placement  PORTABLE CHEST - 1 VIEW  Comparison: 02/12/2012  Findings: There is been interval extubation and removal of the nasogastric tube. The left IJ catheter tip remains and is in the projection of the innominate vein.  No pleural effusion or edema.  No airspace consolidation.  Review of the visualized osseous structures is unremarkable.  IMPRESSION:  1.  Status post extubation.   Original Report Authenticated By: Rosealee Albee, M.D.    Medications: Scheduled Meds:   . antiseptic oral rinse  15 mL Mouth Rinse q12n4p  . aspirin  81 mg Oral Daily  . chlorhexidine  15 mL Mouth Rinse BID  . furosemide      .  furosemide  20 mg Intravenous Q6H  . insulin aspart  0-9 Units Subcutaneous TID WC  . magnesium sulfate 1 - 4 g bolus IVPB  2 g Intravenous Once  . vancomycin  500 mg Oral Q6H   And  . metronidazole  500 mg Intravenous Q8H  . multivitamin with minerals  1 tablet Oral Daily  . piperacillin-tazobactam (ZOSYN)  IV  3.375 g Intravenous Q8H  . potassium chloride  40 mEq Oral TID  . ranitidine  150 mg Per Tube Daily  . DISCONTD: insulin aspart  0-4 Units Subcutaneous Q4H  . DISCONTD: magnesium sulfate 1 - 4 g bolus IVPB  2 g Intravenous  Once  . DISCONTD: vancomycin  1,250 mg Intravenous Q24H   Continuous Infusions:  PRN Meds:.acetaminophen, acetaminophen, alum & mag hydroxide-simeth, fentaNYL, haloperidol lactate, LORazepam, ondansetron (ZOFRAN) IV  Assessment/Plan: Patient Active Problem List   Diagnosis Date Noted  . Delirium due to general medical condition - resolved  02/11/2012  . Cardiac arrest - asystole - PEA - will have cardiology see him today for assessment for workup for possible occult coronary artery disease  02/11/2012  . Acute respiratory failure - resolved.  Chest x-ray clear  02/11/2012  . Aspiration pneumonia - never developed pneumonia after aspiration  02/11/2012  .  02/11/2012  . SIRS (systemic inflammatory response syndrome) - resolved  02/09/2012  . UTI (lower urinary tract infection) - change to oral Keflex  02/09/2012  . Seizures, generalized convulsive - followup on Dilantin level  06/03/2011  . HTN (hypertension) 06/03/2011  . DM (diabetes mellitus), type 2 - sliding scale scale insulin when necessary  06/03/2011  . Anemia 06/03/2011  . Thrombocytopenia C. difficile diarrhea - continue on Flagyl and vancomycin today and we'll likely switch over to plain Flagyl in a.m. Disposition - transfer to telemetry bed  06/03/2011     LOS: 5 days   Joseph Copeland 02/14/2012, 8:06 AM

## 2012-02-14 NOTE — Evaluation (Signed)
Physical Therapy Evaluation Patient Details Name: Joseph Copeland MRN: [id number] DOB: [birth year] Today's Date: 02/14/2012 Time: 6213-0865 PT Time Calculation (min): 18 min  PT Assessment / Plan / Recommendation Clinical Impression  Pt admitted with seizure, UTI, and sepsis experiencing CPR x 3 once admitted along with the below PT problem list.  Pt would benefit from acute PT to maximize independence and facilitate d/c home with HHPT.    PT Assessment  Patient needs continued PT services    Follow Up Recommendations  Home health PT;Supervision/Assistance - 24 hour    Barriers to Discharge None      Equipment Recommendations  Rolling walker with 5" wheels    Recommendations for Other Services     Frequency Min 3X/week    Precautions / Restrictions Precautions Precautions: Fall Restrictions Weight Bearing Restrictions: No   Pertinent Vitals/Pain No c/o with treatment.      Mobility  Bed Mobility Bed Mobility: Supine to Sit;Sit to Supine Supine to Sit: 4: Min guard;HOB flat Sit to Supine: 4: Min guard;HOB flat Details for Bed Mobility Assistance: Guarding for safety with cues for sequence. Transfers Transfers: Sit to Stand;Stand to Sit Sit to Stand: 4: Min assist;With upper extremity assist;From bed Stand to Sit: 4: Min assist;With upper extremity assist;To bed Details for Transfer Assistance: Assist for balance due to c/o slight lightheadedness with vitals stable throughout.  Cues for safety. Ambulation/Gait Ambulation/Gait Assistance: 4: Min assist Ambulation Distance (Feet): 100 Feet Assistive device: None Ambulation/Gait Assistance Details: Assist for balance with cues for tall posture and safety.  LOB x 1 to right with min assist to correct during turning. Gait Pattern: Step-through pattern;Decreased stride length;Shuffle;Narrow base of support Gait velocity: decreased Stairs: No Wheelchair Mobility Wheelchair Mobility: No    Exercises     PT Diagnosis:  Difficulty walking;Generalized weakness  PT Problem List: Decreased strength;Decreased activity tolerance;Decreased balance;Decreased mobility;Decreased knowledge of use of DME PT Treatment Interventions: DME instruction;Gait training;Stair training;Functional mobility training;Therapeutic activities;Balance training;Patient/family education   PT Goals Acute Rehab PT Goals PT Goal Formulation: With patient/family Time For Goal Achievement: 02/21/12 Potential to Achieve Goals: Good Pt will go Supine/Side to Sit: with modified independence PT Goal: Supine/Side to Sit - Progress: Goal set today Pt will go Sit to Supine/Side: with modified independence PT Goal: Sit to Supine/Side - Progress: Goal set today Pt will go Sit to Stand: with modified independence PT Goal: Sit to Stand - Progress: Goal set today Pt will go Stand to Sit: with modified independence PT Goal: Stand to Sit - Progress: Goal set today Pt will Ambulate: >150 feet;with modified independence;with least restrictive assistive device PT Goal: Ambulate - Progress: Goal set today Pt will Go Up / Down Stairs: 1-2 stairs;with min assist;with least restrictive assistive device PT Goal: Up/Down Stairs - Progress: Goal set today  Visit Information  Last PT Received On: 02/14/12 Assistance Needed: +1    Subjective Data  Subjective: "I feel a little lightheaded." Patient Stated Goal: return to work at [employer]   Prior Functioning  Home Living Lives With: Spouse Available Help at Discharge: Family Type of Home: House Home Access: Stairs to enter Secretary/administrator of Steps: 2 Entrance Stairs-Rails: None Home Layout: One level Bathroom Shower/Tub: Engineer, manufacturing systems: Standard Home Adaptive Equipment: Straight cane Prior Function Level of Independence: Needs assistance Needs Assistance: Light Housekeeping;Meal Prep Meal Prep: Maximal Light Housekeeping: Maximal Able to Take Stairs?:  Yes Driving: Yes Vocation: Part time employment Communication Communication: No difficulties    Cognition  Overall Cognitive Status: Appears within functional limits for tasks assessed/performed Arousal/Alertness: Awake/alert Orientation Level: Appears intact for tasks assessed Behavior During Session: [facility] for tasks performed Memory Deficits: Recalled PLOF without any deficits.    Extremity/Trunk Assessment Right Upper Extremity Assessment RUE ROM/Strength/Tone: Within functional levels RUE Sensation: WFL - Light Touch RUE Coordination: WFL - gross/fine motor Left Upper Extremity Assessment LUE ROM/Strength/Tone: Within functional levels LUE Sensation: WFL - Light Touch LUE Coordination: WFL - gross/fine motor Right Lower Extremity Assessment RLE ROM/Strength/Tone: Within functional levels RLE Sensation: WFL - Light Touch RLE Coordination: WFL - gross motor Left Lower Extremity Assessment LLE ROM/Strength/Tone: Within functional levels LLE Sensation: WFL - Light Touch LLE Coordination: WFL - gross motor Trunk Assessment Trunk Assessment: Normal   Balance Balance Balance Assessed: No  End of Session PT - End of Session Equipment Utilized During Treatment: Gait belt Activity Tolerance: Patient tolerated treatment well Patient left: in bed;with call bell/phone within reach;with family/visitor present Nurse Communication: Mobility status  GP     Cephus Shelling 02/14/2012, 10:13 AM  02/14/2012 Cephus Shelling, PT, DPT [phone]

## 2012-02-14 NOTE — Progress Notes (Signed)
Utilization review completed.  

## 2012-02-15 LAB — CULTURE, BLOOD (ROUTINE X 2)

## 2012-02-15 LAB — ALBUMIN: Albumin: 2.6 g/dL — ABNORMAL LOW (ref 3.5–5.2)

## 2012-02-15 LAB — PHENYTOIN LEVEL, TOTAL: Phenytoin Lvl: 7 ug/mL — ABNORMAL LOW (ref 10.0–20.0)

## 2012-02-15 MED ORDER — PHENYTOIN 50 MG PO CHEW
50.0000 mg | CHEWABLE_TABLET | Freq: Every day | ORAL | Status: DC
  Administered 2012-02-15 – 2012-02-17 (×3): 50 mg via ORAL
  Filled 2012-02-15 (×4): qty 1

## 2012-02-15 MED ORDER — PHENYTOIN 50 MG PO CHEW
100.0000 mg | CHEWABLE_TABLET | Freq: Two times a day (BID) | ORAL | Status: DC
  Administered 2012-02-15 – 2012-02-18 (×7): 100 mg via ORAL
  Filled 2012-02-15 (×8): qty 2

## 2012-02-15 MED ORDER — GLUCERNA SHAKE PO LIQD
237.0000 mL | Freq: Three times a day (TID) | ORAL | Status: DC
  Administered 2012-02-15 – 2012-02-17 (×6): 237 mL via ORAL

## 2012-02-15 MED ORDER — RISAQUAD PO CAPS
1.0000 | ORAL_CAPSULE | Freq: Every day | ORAL | Status: DC
  Administered 2012-02-15 – 2012-02-18 (×4): 1 via ORAL
  Filled 2012-02-15 (×4): qty 1

## 2012-02-15 NOTE — Progress Notes (Signed)
INITIAL ADULT NUTRITION ASSESSMENT Date: 02/15/2012   Time: 10:58 AM Reason for Assessment: Poor PO  INTERVENTION: Glucerna TIB Carbohydrate modified medium diet   ASSESSMENT: Male 71 y.o.  Dx: SIRS (systemic inflammatory response syndrome)  Hx:  Past Medical History  Diagnosis Date  . Hypertension   . Arthritis   . Seizures     first12/12-none since  . Diabetes mellitus     type 2-no meds  . Sleep apnea     nasal CPAP at HS   Related Meds:     . acidophilus  1 capsule Oral Daily  . antiseptic oral rinse  15 mL Mouth Rinse q12n4p  . aspirin  81 mg Oral Daily  . cephALEXin  500 mg Oral Q8H  . insulin aspart  0-9 Units Subcutaneous TID WC  . multivitamin with minerals  1 tablet Oral Daily  . phenytoin  100 mg Oral BID  . phenytoin  50 mg Oral Q lunch  . vancomycin  500 mg Oral Q6H  . DISCONTD: chlorhexidine  15 mL Mouth Rinse BID  . DISCONTD: metroNIDAZOLE  500 mg Oral Q8H  . DISCONTD: ranitidine  150 mg Per Tube Daily    Ht: 5\' 6"  (167.6 cm)  Wt: 139 lb 1.8 oz (63.1 kg) Wt Readings from Last 10 Encounters:  02/13/12 139 lb 1.8 oz (63.1 kg)  10/03/11 135 lb (61.236 kg)  10/03/11 135 lb (61.236 kg)  06/04/11 124 lb (56.246 kg)   Weight stable during this admission  Ideal Wt: 64.5 Kg % Ideal Wt: 98%  Usual Wt: 134 Lbs upon admission % Usual Wt: 104%  Body mass index is 22.45 kg/(m^2).  Food/Nutrition Related Hx: Reported to have poor PO intake, NPO 8/20-8/21, cleared for thin liquids by Speech  Labs:  CMP     Component Value Date/Time   NA 140 02/14/2012 0500   K 3.6 02/14/2012 0500   CL 103 02/14/2012 0500   CO2 28 02/14/2012 0500   GLUCOSE 133* 02/14/2012 0500   BUN 14 02/14/2012 0500   CREATININE 0.91 02/14/2012 0500   CALCIUM 8.4 02/14/2012 0500   PROT 6.2 02/11/2012 1140   ALBUMIN 2.6* 02/15/2012 0623   AST 57* 02/11/2012 1140   ALT 49 02/11/2012 1140   ALKPHOS 116 02/11/2012 1140   BILITOT 0.5 02/11/2012 1140   GFRNONAA 84* 02/14/2012 0500   GFRAA  >90 02/14/2012 0500     Intake:  Intake/Output Summary (Last 24 hours) at 02/15/12 1104 Last data filed at 02/15/12 1191  Gross per 24 hour  Intake      0 ml  Output    800 ml  Net   -800 ml   Output:   Diet Order: Carb Control  Supplements/Tube Feeding:  IVF:    Estimated Nutritional Needs:   Kcal: 1500-1700 Protein: 83-93 g Fluid: 1.9 L  NUTRITION DIAGNOSIS: -Inadequate oral intake (NI-2.1).  Status: Ongoing  RELATED TO: poor appetite  AS EVIDENCE BY: medical staff report  MONITORING/EVALUATION(Goals): PO intake that will meet 90 % of estimated needs  EDUCATION NEEDS: -No education needs identified at this time   DOCUMENTATION CODES Per approved criteria  -Not Applicable    Rachid Parham,KATHY 02/15/2012, 10:58 AM

## 2012-02-15 NOTE — Consult Note (Signed)
Admit date: 02/09/2012 Referring Physician  Dr. Donette Larry Primary Physician  Dr. Johnella Moloney Primary Cardiologist  Dr. Verdis Prime Reason for Consultation  Cardiac evaluation for recent PEA arrest  HPI: This is a 71yo male with a history of HTN, arthritis, seizures, DM and OSA on nasal CPAP who was initially admitted to [facility] with fever, chills and questionable seizure as well as altered MS and loss of appetite.  He was hypotensive on admit with SBP in the 70's and was given IVF.  Chest xray was unremarkable and head CT negative for acute changes.  He was felt to be in septic shock and was admitted to the ICU.  On 02/11/2012 he became unresponsive and was noted to have a PEA arrest and was intubated.  It was felt that he had an arrest due to choking and aspirating.Apparently he was sitting on the side of the bed talking to his daughters and wife as well as the sitter and requested that he be helped to sit up in bed to get a bath.  He has just finished breakfast.  He suddenly became pale with difficulty breathing and started coughing.   and started vomiting and then became unresponsive with no pulse and no respirations and a code was called and he was successfully resuscitated.   It was felt by pulmonary critical care medicine that patient had aspiration leading to PEA cardiac arrest.  During hospital stay he was also was found to have E Coli UTI and C diff diarrhea and was started on antibiotics.  We are now asked to consult to evaluate need for further cardiac testing prior to discharge home.  His cardiac enzymes during stay showed a mildly elevated CPK and MB but his troponin levels have all been normal.     PMH:   Past Medical History  Diagnosis Date  . Hypertension   . Arthritis   . Seizures     first12/12-none since  . Diabetes mellitus     type 2-no meds  . Sleep apnea     nasal CPAP at HS     PSH:   Past Surgical History  Procedure Date  . Hemrhoidectomy   . Colonoscopy   . Mass excision  10/07/2011    Procedure: EXCISION MASS;  Surgeon: Louisa Second, MD;  Location: [facility];  Service: Plastics;  Laterality: Left;  excision of large mass on left back with possible lipo assistence  . Liposuction 10/07/2011    Procedure: LIPOSUCTION;  Surgeon: Louisa Second, MD;  Location: [facility];  Service: Plastics;  Laterality: Left;    Allergies:  Review of patient's allergies indicates no known allergies. Prior to Admit Meds:   Prescriptions prior to admission  Medication Sig Dispense Refill  . aspirin 81 MG chewable tablet Chew 81 mg by mouth daily.      . Cyanocobalamin (VITAMIN B 12 PO) Take 1 tablet by mouth daily.        . fish oil-omega-3 fatty acids 1000 MG capsule Take 2 g by mouth daily.        Marland Kitchen ibuprofen (ADVIL,MOTRIN) 200 MG tablet Take 200 mg by mouth every 6 (six) hours as needed. For pain      . metoprolol tartrate (LOPRESSOR) 25 MG tablet Take 25 mg by mouth daily.      . Multiple Vitamin (MULTIVITAMIN WITH MINERALS) TABS Take 1 tablet by mouth daily.      . phenytoin (DILANTIN) 100 MG ER capsule Take 200 mg by  mouth at bedtime. Total dose =250mg       . phenytoin (DILANTIN) 50 MG tablet Chew 50 mg by mouth at bedtime. Takes with 2 100mg  capsules      . testosterone (ANDROGEL) 50 MG/5GM GEL Place 5 g onto the skin daily.       Fam HX:   History reviewed. No pertinent family history. Social HX:    History   Social History  . Marital Status: Married    Spouse Name: N/A    Number of Children: N/A  . Years of Education: N/A   Occupational History  . Not on file.   Social History Main Topics  . Smoking status: Former Smoker    Quit date: 10/03/1966  . Smokeless tobacco: Never Used   Comment: quit smoking 1971  . Alcohol Use: 12.6 oz/week    21 Shots of liquor per week     occ  . Drug Use: No  . Sexually Active: Not Currently   Other Topics Concern  . Not on file   Social History Narrative  . No narrative on file      ROS:  All 11 ROS were addressed and are negative except what is stated in the HPI  Physical Exam: Blood pressure 117/64, pulse 88, temperature 99 F (37.2 C), temperature source Oral, resp. rate 16, height 5\' 6"  (1.676 m), weight 63.1 kg (139 lb 1.8 oz), SpO2 98.00%.    General: Well developed, well nourished, in no acute distress Head: Eyes PERRLA, No xanthomas.   Normal cephalic and atramatic  Lungs:   Clear bilaterally to auscultation and percussion. Heart:   HRRR S1 S2 Pulses are 2+ & equal. Abdomen: Bowel sounds are positive, abdomen soft and non-tender without masses  Extremities:   No clubbing, cyanosis or edema.  DP +1 Neuro: Alert and oriented X 3. Psych:  Good affect, responds appropriately    Labs:   Lab Results  Component Value Date   WBC 15.3* 02/14/2012   HGB 10.8* 02/14/2012   HCT 31.7* 02/14/2012   MCV 94.1 02/14/2012   PLT 207 02/14/2012    Lab 02/14/12 0500 02/11/12 1140  NA 140 --  K 3.6 --  CL 103 --  CO2 28 --  BUN 14 --  CREATININE 0.91 --  CALCIUM 8.4 --  PROT -- 6.2  BILITOT -- 0.5  ALKPHOS -- 116  ALT -- 49  AST -- 57*  GLUCOSE 133* --   No results found for this basename: PTT   Lab Results  Component Value Date   INR 1.34 02/09/2012   INR 1.03 06/03/2011   INR 1.03 12/28/2010   Lab Results  Component Value Date   CKTOTAL 306* 02/10/2012   CKMB 10.2* 02/10/2012   TROPONINI <0.30 02/10/2012     Lab Results  Component Value Date   CHOL 223* 12/28/2010   Lab Results  Component Value Date   HDL 52 12/28/2010   Lab Results  Component Value Date   LDLCALC 148* 12/28/2010   Lab Results  Component Value Date   TRIG 114 12/28/2010   Lab Results  Component Value Date   CHOLHDL 4.3 12/28/2010   No results found for this basename: LDLDIRECT      Radiology:  No results found.  EKG:  NSR with nonspecific ST abnormality ASSESSMENT:  1.  S/p recent PEA cardiac arrest felt secondary to aspiration.  He just finished breakfast and complained of  sudden SOB with coughing and then vomited and had PEA  arrest successfully treated.  2D echo with low normal LVF EF 50-55% with mild MR and mildly reduced RVF.  His QTc on most recent EKG was mildly increased but all other EKGs show normal QT.  He does have cardiac risk factors for CAD including HTN/DM.  He had a mild bump in CPK and MB during hospitalization with normal Troponin. 2.  Septic shock resolved 3.  Aspiration pnemonia 4.  SIRS resolved 5.  UTI 6.  HTN 7.  DM 8.  Anemia  PLAN:   1.  Recommend Lexiscan Cardiolyte prior to discharge.   2. NPO after midnight 8/25 3.  Lexiscan CL on 8/26  Quintella Reichert, MD  02/15/2012  10:48 AM

## 2012-02-15 NOTE — Progress Notes (Signed)
Speech Language Pathology Dysphagia Treatment Patient Details Name: Joseph Copeland MRN: [id number] DOB: 28-Aug-1940 Today's Date: 02/15/2012 Time: 0945-1000 SLP Time Calculation (min): 15 min  Assessment / Plan / Recommendation Clinical Impression  Purpose of diagnostic treatment to follow up for diet tolerance of regular consistency and thin liquids due to noted intermittent coughing and s/s of esophageal based dysphagia during noon meal 02/14/12 (globus sensation and dry cough moderately after swallow of solids)  Patient with baseline "dry" cough prior to PO trials.  Patient observed with thin liquid by straw  x4 with no observed s/s of aspiration.  Improvement noted in initiation of swallow in comparison with dysphagia treatment one day prior. Question s/s present during prior treatment more esophageal based.   Aspiration and reflux precautions reviewed with patient and family members present with repeat demonstration of understanding.  ST to sign off as education complete.      Diet Recommendation  Continue with Current Diet: Regular Initiate / Change Diet: Thin liquid;Regular    SLP Plan All goals met      Swallowing Goals  SLP Swallowing Goals Swallow Study Goal #1 - Progress: Met Swallow Study Goal #2 - Progress: Met  General Temperature Spikes Noted: No Respiratory Status: Room air Behavior/Cognition: Alert;Cooperative;Pleasant mood Oral Cavity - Dentition: Adequate natural dentition Patient Positioning: Upright in bed  Oral Cavity - Oral Hygiene Does patient have any of the following "at risk" factors?: Other - dysphagia Brush patient's teeth BID with toothbrush (using toothpaste with fluoride): Yes   Dysphagia Treatment Treatment focused on: Skilled observation of diet tolerance;Facilitation of pharyngeal phase;Utilization of compensatory strategies Family/Caregiver Educated: Spouse and daughter Treatment Methods/Modalities: Skilled observation;Differential  diagnosis Patient observed directly with PO's: Yes Type of PO's observed: Thin liquids Feeding: Able to feed self Liquids provided via: Cup;Straw Pharyngeal Phase Signs & Symptoms: Delayed cough Type of cueing: Verbal Amount of cueing: Minimal       Moreen Fowler MS, CCC-SLP [phone] [facility] 02/15/2012, 10:45 AM

## 2012-02-15 NOTE — Progress Notes (Signed)
MEDICATION RELATED CONSULT NOTE - FOLLOW UP   Pharmacy Consult: Dilantin Indication:  Seizure disorder  No Known Allergies  Patient Measurements: Height: 5\' 6"  (167.6 cm) Weight: 139 lb 1.8 oz (63.1 kg) IBW/kg (Calculated) : 63.8   Vital Signs: Temp: 99 F (37.2 C) (08/24 0500) Temp src: Oral (08/24 0500) BP: 117/64 mmHg (08/24 0500) Pulse Rate: 88  (08/24 0500) Intake/Output from previous day: 08/23 0701 - 08/24 0700 In: 372.5 [P.O.:360; IV Piggyback:12.5] Out: 650 [Urine:650] Intake/Output from this shift: Total I/O In: -  Out: 150 [Urine:150]  Labs:  Basename 02/15/12 0623 02/14/12 0500 02/13/12 0507 02/12/12 2000  WBC -- 15.3* 13.6* --  HGB -- 10.8* 10.6* --  HCT -- 31.7* 30.1* --  PLT -- 207 137* --  APTT -- -- -- --  CREATININE -- 0.91 0.91 0.95  LABCREA -- -- -- --  CREATININE -- 0.91 0.91 0.95  CREAT24HRUR -- -- -- --  MG -- 1.6 1.7 1.8  PHOS -- 2.7 3.1 2.6  ALBUMIN 2.6* 2.2* -- --  PROT -- -- -- --  ALBUMIN 2.6* 2.2* -- --  AST -- -- -- --  ALT -- -- -- --  ALKPHOS -- -- -- --  BILITOT -- -- -- --  BILIDIR -- -- -- --  IBILI -- -- -- --   Estimated Creatinine Clearance: 67.4 ml/min (by C-G formula based on Cr of 0.91).   Microbiology: Recent Results (from the past 720 hour(s))  URINE CULTURE     Status: Normal   Collection Time   02/09/12  8:52 AM      Component Value Range Status Comment   Specimen Description URINE, CLEAN CATCH   Final    Special Requests ADDED ON 02/09/12 AT 1050   Final    Culture  Setup Time 02/09/2012 16:54   Final    Colony Count >=100,000 COLONIES/ML   Final    Culture ESCHERICHIA COLI   Final    Report Status 02/11/2012 FINAL   Final    Organism ID, Bacteria ESCHERICHIA COLI   Final   CULTURE, BLOOD (ROUTINE X 2)     Status: Normal   Collection Time   02/09/12 10:41 AM      Component Value Range Status Comment   Specimen Description BLOOD LEFT ARM   Final    Special Requests BOTTLES DRAWN AEROBIC AND ANAEROBIC  10CC   Final    Culture  Setup Time 02/09/2012 16:55   Final    Culture NO GROWTH 5 DAYS   Final    Report Status 02/15/2012 FINAL   Final   CULTURE, BLOOD (ROUTINE X 2)     Status: Normal   Collection Time   02/09/12 10:46 AM      Component Value Range Status Comment   Specimen Description BLOOD LEFT HAND   Final    Special Requests BOTTLES DRAWN AEROBIC AND ANAEROBIC 10CC   Final    Culture  Setup Time 02/09/2012 16:55   Final    Culture NO GROWTH 5 DAYS   Final    Report Status 02/15/2012 FINAL   Final   MRSA PCR SCREENING     Status: Normal   Collection Time   02/09/12 12:49 PM      Component Value Range Status Comment   MRSA by PCR NEGATIVE  NEGATIVE Final   CLOSTRIDIUM DIFFICILE BY PCR     Status: Abnormal   Collection Time   02/09/12  3:18 PM  Component Value Range Status Comment   C difficile by pcr POSITIVE (*) NEGATIVE Final       Assessment: 71 YOM on dilantin PTA and was admitted 02/09/12 with breakthrough seizure.  Dilantin levels have been supratherapeutic and med was subsequently held.  Level decreased to therapeutic level today.  Patient's renal function has remained stable; albumin is trending up.  8/18 - DPH level 6.9 (corrected 11.1) 8/19 - DPH level 14.3 (corrected 23.1) - post 500mg  IV x 1 8/20 - DPH level 15.8 (corrected 29.2) 8/22 - DPH level 14.9 (corrected 27.6) 8/23 - DPH level 11.3 (corrected 20.9) 8/24 - DPH level 7 (corrected 11.3)   Goal of Therapy:  Corrected DPH level 10-20 mcg/mL   Plan:  - Resume Dilantin 250mg  PO daily (100mg  BID + 50mg  at lunch) - Monitor for seizure activity - DPH level in 5 days (02/20/12)     Madysen Faircloth D. Laney Potash, PharmD, BCPS Pager:  [phone] 02/15/2012, 9:40 AM

## 2012-02-15 NOTE — Progress Notes (Signed)
Subjective: Pt with poor appetite No N/V/D/ no pain No SOB  Objective: Vital signs in last 24 hours: Temp:  [99 F (37.2 C)-99.7 F (37.6 C)] 99 F (37.2 C) (08/24 0500) Pulse Rate:  [84-90] 88  (08/24 0500) Resp:  [16-20] 16  (08/24 0500) BP: (105-128)/(64-78) 117/64 mmHg (08/24 0500) SpO2:  [97 %-98 %] 98 % (08/24 0500) Weight change:  Last BM Date: 02/14/12  Intake/Output from previous day: 08/23 0701 - 08/24 0700 In: 372.5 [P.O.:360; IV Piggyback:12.5] Out: 650 [Urine:650] Intake/Output this shift: Total I/O In: -  Out: 150 [Urine:150]  General appearance: alert Resp: clear to auscultation bilaterally Cardio: regular rate and rhythm GI: soft, non-tender; bowel sounds normal; no masses,  no organomegaly  Lab Results:  Basename 02/14/12 0500 02/13/12 0507  WBC 15.3* 13.6*  HGB 10.8* 10.6*  HCT 31.7* 30.1*  PLT 207 137*   BMET  Basename 02/14/12 0500 02/13/12 0507  NA 140 146*  K 3.6 3.7  CL 103 108  CO2 28 27  GLUCOSE 133* 144*  BUN 14 18  CREATININE 0.91 0.91  CALCIUM 8.4 8.1*    Studies/Results: No results found.  Medications: I have reviewed the patient's current medications.  Assessment/Plan:    .  / Spetic Shock; Cardiac arrest - asystole - PEA - will have cardiology see him today for assessment for workup for possible occult coronary artery disease  02/11/2012   .  Acute respiratory failure - resolved. Chest x-ray clear  02/11/2012   .  Aspiration pneumonia - never developed pneumonia after aspiration  02/11/2012   .   02/11/2012   .  SIRS (systemic inflammatory response syndrome) - resolved  02/09/2012   .  UTI (lower urinary tract infection) - change to oral Keflex  02/09/2012   .  Seizures, generalized convulsive -continue on dilantin- level ok- mild low 06/03/2011   .  HTN (hypertension)  06/03/2011   .  DM (diabetes mellitus), type 2 - sliding scale scale insulin when necessary  06/03/2011   .  Anemia  06/03/2011   .  Thrombocytopenia   C. difficile diarrhea - continue Vanc/ d/c flagyl- cbc in am- add probiotic  Decondition: PT /OT Low Albumin- nutrition consult 06/03/2011       LOS: 6 days   Kemond Amorin 02/15/2012, 9:37 AM

## 2012-02-16 LAB — CBC
HCT: 34.9 % — ABNORMAL LOW (ref 39.0–52.0)
MCH: 32.4 pg (ref 26.0–34.0)
MCV: 94.3 fL (ref 78.0–100.0)
Platelets: 327 10*3/uL (ref 150–400)
RDW: 18 % — ABNORMAL HIGH (ref 11.5–15.5)
WBC: 13.7 10*3/uL — ABNORMAL HIGH (ref 4.0–10.5)

## 2012-02-16 LAB — GLUCOSE, CAPILLARY: Glucose-Capillary: 149 mg/dL — ABNORMAL HIGH (ref 70–99)

## 2012-02-16 MED ORDER — MENTHOL 3 MG MT LOZG
1.0000 | LOZENGE | OROMUCOSAL | Status: DC | PRN
  Filled 2012-02-16: qty 9

## 2012-02-16 NOTE — Progress Notes (Signed)
SUBJECTIVE:  No new complaints  OBJECTIVE:   Vitals:   Filed Vitals:   02/14/12 2100 02/15/12 0500 02/15/12 2130 02/16/12 0500  BP: 128/69 117/64 109/57 124/71  Pulse: 90 88 96 92  Temp: 99.7 F (37.6 C) 99 F (37.2 C) 99.2 F (37.3 C) 99 F (37.2 C)  TempSrc: Oral Oral Oral Oral  Resp: 18 16 18 18   Height:      Weight:      SpO2: 97% 98% 100% 96%   I&O's:   Intake/Output Summary (Last 24 hours) at 02/16/12 4098 Last data filed at 02/16/12 0700  Gross per 24 hour  Intake    200 ml  Output    100 ml  Net    100 ml   TELEMETRY: Reviewed telemetry pt in NSR:     PHYSICAL EXAM General: Well developed, well nourished, in no acute distress Head: Eyes PERRLA, No xanthomas.   Normal cephalic and atramatic  Lungs:   Clear bilaterally to auscultation and percussion. Heart:   HRRR S1 S2 Pulses are 2+ & equal. Abdomen: Bowel sounds are positive, abdomen soft and non-tender without masses  Extremities:   No clubbing, cyanosis or edema.  DP +1 Neuro: Alert and oriented X 3. Psych:  Good affect, responds appropriately   LABS: Basic Metabolic Panel:  Basename 02/14/12 0500  NA 140  K 3.6  CL 103  CO2 28  GLUCOSE 133*  BUN 14  CREATININE 0.91  CALCIUM 8.4  MG 1.6  PHOS 2.7   Liver Function Tests:  Basename 02/15/12 0623 02/14/12 0500  AST -- --  ALT -- --  ALKPHOS -- --  BILITOT -- --  PROT -- --  ALBUMIN 2.6* 2.2*   No results found for this basename: LIPASE:2,AMYLASE:2 in the last 72 hours CBC:  Basename 02/16/12 0620 02/14/12 0500  WBC 13.7* 15.3*  NEUTROABS -- --  HGB 12.0* 10.8*  HCT 34.9* 31.7*  MCV 94.3 94.1  PLT 327 207   Coag Panel:   Lab Results  Component Value Date   INR 1.34 02/09/2012   INR 1.03 06/03/2011   INR 1.03 12/28/2010    RADIOLOGY: Dg Chest 2 View  02/09/2012  *RADIOLOGY REPORT*  Clinical Data: Fever.  Cough.  Seizures.  CHEST - 2 VIEW  Comparison: 06/03/2011  Findings: The heart size and pulmonary vascularity are normal. The  lungs appear clear and expanded without focal air space disease or consolidation. No blunting of the costophrenic angles.  No pneumothorax.  Mediastinal contours appear intact.  Calcification of the aorta.  Degenerative changes in the spine.  No significant change since previous study.  IMPRESSION: No evidence of active pulmonary disease.  Original Report Authenticated By: Marlon Pel, M.D.   US Abdomen Complete  02/10/2012  *RADIOLOGY REPORT*  Clinical Data:  Abdominal distention, hypertension, diabetes, question cirrhosis, ascites  ULTRASOUND ABDOMEN:  Technique:  Sonography of upper abdominal structures was performed. Imaging degraded by labored breathing.  Comparison:  06/04/2011  Gallbladder:  Gallbladder wall thickening up to 10 mm thick with hypoechoic striations consistent with edema.  No definite calculi or sonographic Kinzler's sign.  Common bile duct:  Upper normal caliber 6 mm diameter.  Liver:  Echogenic, question fatty infiltration though this can be seen with cirrhosis and certain infiltrative disorders.  No definite hepatic nodularity for irregular contours identified. Several small hypoechoic foci are seen question small cysts, largest 18 mm diameter. Additional of a 16 mm diameter hypoechoic focus centrally within right lobe, cannot  exclude small solid nodule.  No intrahepatic biliary dilatation.  IVC:  Normal appearance  Pancreas:  Body and proximal tail normal appearance, remainder obscured by bowel gas.  Spleen:  Normal appearance, 6.3 cm length.  Right kidney:  10.5 cm length. Normal morphology without mass or hydronephrosis.  Left kidney:  11.0 cm length.  Normal cortical thickness without definite solid mass or hydronephrosis.  Hypoechoic nodule centrally left kidney, 1.2 x 1.7 x 1.4 cm, corresponding to a cyst seen on the previous exam, grossly stable in size but less well visualized on current study.  Aorta:  Limited visualization due to bowel gas.  Other:  No ascites.  IMPRESSION:  Echogenic liver which could represent fatty infiltration or cirrhosis. Small hepatic cysts with an additional 1.6 cm diameter nonspecific hypoechoic focus centrally within the liver. A solid mass is not excluded; follow-up MR imaging with without contrast recommended to evaluate. Probable small left renal cyst 1.7 cm diameter. Incomplete visualization of pancreas and aorta. Nonspecific gallbladder wall thickening, though no definite calculi are visualized; acalculous cholecystitis not excluded.   Original Report Authenticated By: Lollie Marrow, M.D. ( 02/10/2012 09:57:15 )    Dg Chest Port 1 View  02/13/2012  *RADIOLOGY REPORT*  Clinical Data: Evaluate ET tube placement  PORTABLE CHEST - 1 VIEW  Comparison: 02/12/2012  Findings: There is been interval extubation and removal of the nasogastric tube. The left IJ catheter tip remains and is in the projection of the innominate vein.  No pleural effusion or edema.  No airspace consolidation.  Review of the visualized osseous structures is unremarkable.  IMPRESSION:  1.  Status post extubation.   Original Report Authenticated By: Rosealee Albee, M.D.    Dg Chest Port 1 View  02/12/2012  *RADIOLOGY REPORT*  Clinical Data: Ventilator dependent respiratory failure.  PORTABLE CHEST - 1 VIEW  Comparison: 02/11/2012  Findings: Endotracheal tube has been pulled back with tip now 2 cm above carina.  Nasogastric tube and left jugular center venous catheter remain in appropriate position.  Low lung volumes and mild bibasilar atelectasis show no significant change.  No evidence of pulmonary consolidation or edema.  Heart size is normal.  IMPRESSION:  1.  Low lung volumes and mild bibasilar atelectasis, without significant change. 2.  Endotracheal tube in appropriate position.   Original Report Authenticated By: Danae Orleans, M.D.    Dg Chest Port 1 View  02/11/2012  *RADIOLOGY REPORT*  Clinical Data: 71 year old male endotracheal tube advancement.  PORTABLE CHEST - 1  VIEW  Comparison: 1202 hours the same day earlier.  Findings: AP portable semi upright view at 1403 hours. Endotracheal tube tip now above the carina.  This should be retracted for more optimal place.  Stable left IJ central line.  Enteric tube now in place, looped in the gastric body region.  Stable lung volumes.  Mildly improved bibasilar ventilation.  No confluent opacity, pneumothorax, edema, or definite effusion. Stable cardiac size and mediastinal contours.  IMPRESSION:  1.  Endotracheal tube tip now above the carina.  Withdraw 2 cm for optimal placement. 2.  Enteric tube placed, looped at the gastric body. 3. No acute cardiopulmonary abnormality.   Original Report Authenticated By: Harley Hallmark, M.D.    Dg Chest Port 1 View  02/11/2012  *RADIOLOGY REPORT*  Clinical Data: Intubation.  PORTABLE CHEST - 1 VIEW  Comparison: 02/09/2012  Findings: An endotracheal tube is in place, with tip 2.9 cm above the carina.  A left internal jugular line  has been placed.  Its tip is oriented transversely and projects over the SVC.  Low lung volumes noted with mild cardiomegaly and thoracic spondylosis.  Linear subsegmental atelectasis noted at the right lung base.  IMPRESSION:  1.  ET tube tip 2.9 cm above the carina. 2.  Left IJ line tip projects over the SVC.  No pneumothorax observed. 3.  Low lung volumes. 4.  Mild atelectasis at the right lung base.   Original Report Authenticated By: Dellia Cloud, M.D.    Dg Chest Portable 1 View  02/09/2012  *RADIOLOGY REPORT*  Clinical Data: Sepsis.  Central line placement.  PORTABLE CHEST - 1 VIEW  Comparison: 6:47 hours  Findings: Left internal jugular vein center venous catheter placed. Tip is in the upper SVC.  No pneumothorax.  Normal heart size. Clear lungs.  IMPRESSION: Left internal jugular vein central venous catheter placement with its tip in the upper SVC and no pneumothorax.  Original Report Authenticated By: Donavan Burnet, M.D.   Dg Abd 2  Views  02/09/2012  *RADIOLOGY REPORT*  Clinical Data: Abdominal distention for 4 months.  Diarrhea for 2 months.  Fever for 1 week.  Cough and congestion.  ABDOMEN - 2 VIEW  Comparison: Chest film earlier today.  Abdominal pelvic CT of 01/05/2009  Findings: Right-sided decubitus view demonstrates no free intraperitoneal air or significant air fluid levels.  Supine view demonstrates gas within normal caliber large and small bowel loops.  Distal gas.  No pneumatosis.  Phleboliths in the pelvis. No abnormal abdominal calcifications.   No appendicolith.  IMPRESSION: No acute findings.  Original Report Authenticated By: Consuello Bossier, M.D.   Ct Portable Head W/o Cm  02/11/2012  *RADIOLOGY REPORT*  Clinical Data: Cardiac arrest  CT HEAD WITHOUT CONTRAST  Technique:  Contiguous axial images were obtained from the base of the skull through the vertex without contrast.  Comparison: MRI 06/04/2011  Findings: Portable CT equipment was utilized.  Generalized atrophy.  Ventricle size is normal.  Negative for hemorrhage.  No acute infarct or mass.  IMPRESSION: No acute abnormality.   Original Report Authenticated By: Camelia Phenes, M.D.       ASSESSMENT:  1. S/p recent PEA cardiac arrest felt secondary to aspiration. He just finished breakfast and complained of sudden SOB with coughing and then vomited and had PEA arrest successfully treated. 2D echo with low normal LVF EF 50-55% with mild MR and mildly reduced RVF. His QTc on most recent EKG was mildly increased but all other EKGs show normal QT. He does have cardiac risk factors for CAD including HTN/DM. He had a mild bump in CPK and MB during hospitalization with normal Troponin.  2. Septic shock resolved  3. Aspiration pnemonia  4. SIRS resolved  5. UTI  6. HTN  7. DM  8. Anemia   PLAN:   1. NPO after midnight 8/25  2. Lexiscan CL on 8/26   Quintella Reichert, MD  02/16/2012  9:24 AM

## 2012-02-16 NOTE — Progress Notes (Signed)
Patient ID: Joseph Copeland, male   DOB: 11-25-1940, 71 y.o.   MRN: [id number] Subjective: Pt doing better diarrhea less Stress test tomorrow per cadiology  Objective: Vital signs in last 24 hours: Temp:  [99 F (37.2 C)-99.2 F (37.3 C)] 99 F (37.2 C) (08/25 0500) Pulse Rate:  [92-96] 92  (08/25 0500) Resp:  [18] 18  (08/25 0500) BP: (109-124)/(57-71) 124/71 mmHg (08/25 0500) SpO2:  [96 %-100 %] 96 % (08/25 0500)   Intake/Output from previous day: 08/24 0701 - 08/25 0700 In: 200 [P.O.:200] Out: 250 [Urine:250]    General appearance: alert Resp: clear to auscultation bilaterally Cardio: regular rate and rhythm GI: soft, non-tender; bowel sounds normal; no masses,  no organomegaly  Lab Results:  Basename 02/16/12 0620 02/14/12 0500  WBC 13.7* 15.3*  HGB 12.0* 10.8*  HCT 34.9* 31.7*  PLT 327 207   BMET  Basename 02/14/12 0500  NA 140  K 3.6  CL 103  CO2 28  GLUCOSE 133*  BUN 14  CREATININE 0.91  CALCIUM 8.4   Lab Results  Component Value Date   ALT 49 02/11/2012   AST 57* 02/11/2012   ALKPHOS 116 02/11/2012   BILITOT 0.5 02/11/2012    Assessment/Plan:  Principal Problem:  *SIRS (systemic inflammatory response syndrome) Urosepsis- po keflex- cbc ok Active Problems:  Seizures, generalized convulsive- dilantin po  HTN (hypertension)- stable  DM (diabetes mellitus), type 2  Septic shock  Delirium due to general medical condition- resolved  Cardiac arrest - asystole-  Cardiac stress test in am  Acute respiratory failure- due to aspiration  C. difficile diarrhea- po vancomycin Poor po intake- push nutrition PT consult- decondition   Ashland Osmer 02/16/2012, 9:32 AM

## 2012-02-17 ENCOUNTER — Inpatient Hospital Stay (HOSPITAL_COMMUNITY): Payer: Medicare Other

## 2012-02-17 LAB — GLUCOSE, CAPILLARY
Glucose-Capillary: 140 mg/dL — ABNORMAL HIGH (ref 70–99)
Glucose-Capillary: 132 mg/dL — ABNORMAL HIGH (ref 70–99)
Glucose-Capillary: 109 mg/dL — ABNORMAL HIGH (ref 70–99)

## 2012-02-17 MED ORDER — REGADENOSON 0.4 MG/5ML IV SOLN
0.4000 mg | Freq: Once | INTRAVENOUS | Status: AC
  Administered 2012-02-17: 0.4 mg via INTRAVENOUS

## 2012-02-17 MED ORDER — TECHNETIUM TC 99M TETROFOSMIN IV KIT
10.0000 | PACK | Freq: Once | INTRAVENOUS | Status: AC | PRN
  Administered 2012-02-17: 10 via INTRAVENOUS

## 2012-02-17 MED ORDER — TECHNETIUM TC 99M TETROFOSMIN IV KIT
30.0000 | PACK | Freq: Once | INTRAVENOUS | Status: AC | PRN
  Administered 2012-02-17: 30 via INTRAVENOUS

## 2012-02-17 NOTE — Progress Notes (Signed)
Subjective: Patient feeling much better.  Mentally clear.  Due for Cardiolite test this a.m. to assess for possibility of coronary artery disease.  Can probably be discharged home this afternoon if stress test is normal  Objective: Weight change:  No intake or output data in the 24 hours ending 02/17/12 0742  Filed Vitals:   02/15/12 2130 02/16/12 0500 02/16/12 2100 02/17/12 0500  BP: 109/57 124/71 107/65 121/62  Pulse: 96 92 82 94  Temp: 99.2 F (37.3 C) 99 F (37.2 C) 98.7 F (37.1 C) 98.6 F (37 C)  TempSrc: Oral Oral Oral Oral  Resp: 18 18 18 18   Height:      Weight:      SpO2: 100% 96% 100% 98%   General Appearance: Alert, cooperative, no distress, appears stated age Head: Normocephalic, without obvious abnormality, atraumatic Neck: Supple, symmetrical Lungs: Clear to auscultation bilaterally, respirations unlabored Heart: Regular rate and rhythm, S1 and S2 normal, no murmur, rub or gallop Abdomen: Soft, non-tender, bowel sounds active all four quadrants, no masses, no organomegaly Extremities: Extremities normal, atraumatic, no cyanosis or edema Pulses: 2+ and symmetric all extremities Skin: Skin color, texture, turgor normal, no rashes or lesions Neuro: CNII-XII intact. Normal strength, sensation and reflexes throughout   Lab Results: No results found for this basename: NA:2,K:2,CL:2,CO2:2,GLUCOSE:2,BUN:2,CREATININE:2,CALCIUM:2,MG:2,PHOS:2 in the last 72 hours  Basename 02/15/12 0623  AST --  ALT --  ALKPHOS --  BILITOT --  PROT --  ALBUMIN 2.6*   No results found for this basename: LIPASE:2,AMYLASE:2 in the last 72 hours  Basename 02/16/12 0620  WBC 13.7*  NEUTROABS --  HGB 12.0*  HCT 34.9*  MCV 94.3  PLT 327   No results found for this basename: CKTOTAL:3,CKMB:3,CKMBINDEX:3,TROPONINI:3 in the last 72 hours No components found with this basename: POCBNP:3 No results found for this basename: DDIMER:2 in the last 72 hours No results found for this  basename: HGBA1C:2 in the last 72 hours No results found for this basename: CHOL:2,HDL:2,LDLCALC:2,TRIG:2,CHOLHDL:2,LDLDIRECT:2 in the last 72 hours No results found for this basename: TSH,T4TOTAL,FREET3,T3FREE,THYROIDAB in the last 72 hours No results found for this basename: VITAMINB12:2,FOLATE:2,FERRITIN:2,TIBC:2,IRON:2,RETICCTPCT:2 in the last 72 hours  Studies/Results: No results found. Medications: Scheduled Meds:   . acidophilus  1 capsule Oral Daily  . antiseptic oral rinse  15 mL Mouth Rinse q12n4p  . aspirin  81 mg Oral Daily  . cephALEXin  500 mg Oral Q8H  . feeding supplement  237 mL Oral TID BM  . insulin aspart  0-9 Units Subcutaneous TID WC  . multivitamin with minerals  1 tablet Oral Daily  . phenytoin  100 mg Oral BID  . phenytoin  50 mg Oral Q lunch  . vancomycin  500 mg Oral Q6H   Continuous Infusions:  PRN Meds:.acetaminophen, alum & mag hydroxide-simeth, menthol-cetylpyridinium, ondansetron (ZOFRAN) IV  Assessment/Plan:  Principal Problem:  *SIRS (systemic inflammatory response syndrome) - much improved.  White count 15 down.  Continue on Keflex for one more week Urosepsis- po keflex- cbc ok  Active Problems:  Seizures, generalized convulsive- dilantin po - Dilantin level is slightly low.  Give 100 milligrams 3 times a day today and check level later this week likely as outpatient HTN (hypertension)- stable  DM (diabetes mellitus), type 2  Septic shock  Delirium due to general medical condition- resolved  Cardiac arrest - asystole- Cardiac stress test today - possibly home later today Acute respiratory failure- due to aspiration - resolved C. difficile diarrhea- po vancomycin - diarrhea essentially resolved  LOS: 8 days   Joseph Copeland 02/17/2012, 7:42 AM

## 2012-02-17 NOTE — Progress Notes (Signed)
Spoke to patient. He is for stress nuclear via pharmacologic stress this AM.

## 2012-02-17 NOTE — Progress Notes (Signed)
Per Rehab MD & PT eval: Pt should be able to d/c home w/ [facility].  [phone]

## 2012-02-17 NOTE — Progress Notes (Signed)
PT Cancellation Note  Treatment cancelled today due to patient's refusal to participate & cancel medical.  Attempted to see pt x 3.  Pt off floor for procedure this AM.  Pt now wants to wait to eat since pt been off floor all day.  Will attempt to see later on today if time allows.  Thanks!!  Joseph Copeland 02/17/2012, 2:35 PM Jake Shark, PT DPT [phone]

## 2012-02-18 MED ORDER — CEPHALEXIN 500 MG PO CAPS: 500.0000 mg | ORAL_CAPSULE | Freq: Three times a day (TID) | ORAL | Status: AC

## 2012-02-18 MED ORDER — VANCOMYCIN HCL 250 MG PO CAPS: 250.0000 mg | ORAL_CAPSULE | Freq: Three times a day (TID) | ORAL | Status: AC

## 2012-02-18 NOTE — Discharge Summary (Signed)
Physician Discharge Summary  NAME:Joseph Copeland  ZOX:[id number]  DOB: Apr 12, 1941   Admit date: 02/09/2012 Discharge date: 02/18/2012  Discharge Diagnoses:  Principal Problem: SIRS (systemic inflammatory response syndrome) - secondary to Escherichia coli sepsis secondary to UTI Active Problems:  Seizures, generalized convulsive - symptomatically controlled  HTN (hypertension) - controlled  DM (diabetes mellitus), type 2 - controlled  UTI (lower urinary tract infection) - was admitted with Escherichia coli urinary tract infection causing SIRS  Septic shock - resolved  Delirium due to general medical condition - resolved  Cardiac arrest - asystole - occurred after aspiration with respiratory arrest following nausea and vomiting secondary to Escherichia coli sepsis  Acute respiratory failure - resolved  Anoxic encephalopathy - resolved  C. difficile diarrhea - will continue with one more week of oral vancomycin as an outpatient  Discharge Condition: Much improved  Hospital Course:  Mr. Joseph Copeland is a very pleasant 71 year old male who presented all well on 02/09/2012 with hypotension and fever and was found to have an Escherichia coli urinary tract infection.  He has had a couple of weeks at home with declining status.  His wife states that he had not been himself for one to 2 weeks and had loss of appetite diarrhea chills and general weakness.  He was found to have a systolic blood pressure of 70 in the emergency room and was aggressively treated with IV fluids and antibiotics.  Urine culture ultimately grew out Escherichia coli.  After 2 days in intensive care, he was transferred to step down where he developed nausea vomiting and aspirated leading to a respiratory arrest and then a cardiac arrest.  Patient underwent CPR with spontaneous pulse recurrent after PEA.  He was transferred back to the intensive care unit, and weaned off the ventilator within approximately 48 hours.  He has done very  well since then and with declining white cell count and normal vital signs.  He has had no further seizure activity and he did have a adenosine Cardiolite perfusion scan performed on 02/17/2012 showing a normal ejection fraction and no cardial ischemia. He was found to have C. difficile positive diarrhea and will be treated with one more week of oral vancomycin as an outpatient.  UTI was likely secondary to chronic symptoms of BPH and he'll be followed up by Dr. Ezzie Dural as an outpatient [facility]. Seizure activity occurred once after nausea vomiting and aspiration.  Dilantin level is slightly low at discharge but will be discharged home on 250 milligrams of Dilantin by mouth each bedtime.  We'll followup Dilantin levels in one week in my office  Consults: Treatment Team:  Lesleigh Noe, MD - cardiology                                                 Pulmonary critical care medicine  Disposition: 01-Home or Self Care  Discharge Physical Exam:  Alert and oriented male in no acute distress  Filed Vitals:   02/17/12 1216 02/17/12 1425 02/17/12 2000 02/18/12 0500  BP: 96/64 108/68 133/72 113/72  Pulse:  86 89 90  Temp:  97.9 F (36.6 C) 98.6 F (37 C) 98.1 F (36.7 C)  TempSrc:  Oral  Oral  Resp:  18 18 18   Height:      Weight:      SpO2:  98% 98% 98%  General Appearance: Alert, cooperative, no distress, appears stated age  Head: Normocephalic, without obvious abnormality, atraumatic  Neck: Supple, symmetrical  Lungs: Clear to auscultation bilaterally, respirations unlabored  Heart: Regular rate and rhythm, S1 and S2 normal, no murmur, rub or gallop  Abdomen: Soft, non-tender, bowel sounds active all four quadrants, no masses, no organomegaly  Extremities: Extremities normal, atraumatic, no cyanosis or edema  Pulses: 2+ and symmetric all extremities  Skin: Skin color, texture, turgor normal, no rashes or lesions  Neuro: CNII-XII intact. Normal strength, sensation and  reflexes throughout   Discharge Orders    Future Orders Please Complete By Expires   Diet - low sodium heart healthy      Increase activity slowly      Discharge instructions      Comments:   Call M.D. for recurrent fever chills seizure activity or poor oral intake.  Followup with Dr. Kevan Ny in one week.  Call [phone] or [phone] for appointment   Call MD for:  temperature >100.4      Call MD for:  persistant nausea and vomiting      Call MD for:  severe uncontrolled pain      Call MD for:  difficulty breathing, headache or visual disturbances      Call MD for:  hives        Medication List  As of 02/18/2012  8:18 AM   STOP taking these medications         ibuprofen 200 MG tablet         TAKE these medications         aspirin 81 MG chewable tablet   Chew 81 mg by mouth daily.      cephALEXin 500 MG capsule   Commonly known as: KEFLEX   Take 1 capsule (500 mg total) by mouth 3 (three) times daily.      fish oil-omega-3 fatty acids 1000 MG capsule   Take 2 g by mouth daily.      metoprolol tartrate 25 MG tablet   Commonly known as: LOPRESSOR   Take 25 mg by mouth daily.      multivitamin with minerals Tabs   Take 1 tablet by mouth daily.      phenytoin 100 MG ER capsule   Commonly known as: DILANTIN   Take 200 mg by mouth at bedtime. Total dose =250mg       phenytoin 50 MG tablet   Commonly known as: DILANTIN   Chew 50 mg by mouth at bedtime. Takes with 2 100mg  capsules      testosterone 50 MG/5GM Gel   Commonly known as: ANDROGEL   Place 5 g onto the skin daily.      vancomycin 250 MG capsule   Commonly known as: VANCOCIN   Take 1 capsule (250 mg total) by mouth 3 (three) times daily.      VITAMIN B 12 PO   Take 1 tablet by mouth daily.             Things to follow up in the outpatient setting: Patient is to be followed for C. difficile diarrhea.  Again we'll plan to see in the office within one week.  Any recurrence of seizure activity or fevers or  chills or dysuria, patient or family should notify us immediately  Time coordinating discharge: 35 minutes  The results of significant diagnostics from this hospitalization (including imaging, microbiology, ancillary and laboratory) are listed below for reference.    Significant Diagnostic Studies:  Dg Chest 2 View  02/09/2012  *RADIOLOGY REPORT*  Clinical Data: Fever.  Cough.  Seizures.  CHEST - 2 VIEW  Comparison: 06/03/2011  Findings: The heart size and pulmonary vascularity are normal. The lungs appear clear and expanded without focal air space disease or consolidation. No blunting of the costophrenic angles.  No pneumothorax.  Mediastinal contours appear intact.  Calcification of the aorta.  Degenerative changes in the spine.  No significant change since previous study.  IMPRESSION: No evidence of active pulmonary disease.  Original Report Authenticated By: Marlon Pel, M.D.   US Abdomen Complete  02/10/2012  *RADIOLOGY REPORT*  Clinical Data:  Abdominal distention, hypertension, diabetes, question cirrhosis, ascites  ULTRASOUND ABDOMEN:  Technique:  Sonography of upper abdominal structures was performed. Imaging degraded by labored breathing.  Comparison:  06/04/2011  Gallbladder:  Gallbladder wall thickening up to 10 mm thick with hypoechoic striations consistent with edema.  No definite calculi or sonographic Vermette's sign.  Common bile duct:  Upper normal caliber 6 mm diameter.  Liver:  Echogenic, question fatty infiltration though this can be seen with cirrhosis and certain infiltrative disorders.  No definite hepatic nodularity for irregular contours identified. Several small hypoechoic foci are seen question small cysts, largest 18 mm diameter. Additional of a 16 mm diameter hypoechoic focus centrally within right lobe, cannot exclude small solid nodule.  No intrahepatic biliary dilatation.  IVC:  Normal appearance  Pancreas:  Body and proximal tail normal appearance, remainder obscured  by bowel gas.  Spleen:  Normal appearance, 6.3 cm length.  Right kidney:  10.5 cm length. Normal morphology without mass or hydronephrosis.  Left kidney:  11.0 cm length.  Normal cortical thickness without definite solid mass or hydronephrosis.  Hypoechoic nodule centrally left kidney, 1.2 x 1.7 x 1.4 cm, corresponding to a cyst seen on the previous exam, grossly stable in size but less well visualized on current study.  Aorta:  Limited visualization due to bowel gas.  Other:  No ascites.  IMPRESSION: Echogenic liver which could represent fatty infiltration or cirrhosis. Small hepatic cysts with an additional 1.6 cm diameter nonspecific hypoechoic focus centrally within the liver. A solid mass is not excluded; follow-up MR imaging with without contrast recommended to evaluate. Probable small left renal cyst 1.7 cm diameter. Incomplete visualization of pancreas and aorta. Nonspecific gallbladder wall thickening, though no definite calculi are visualized; acalculous cholecystitis not excluded.   Original Report Authenticated By: Lollie Marrow, M.D. ( 02/10/2012 09:57:15 )    Nm Myocar Multi W/spect W/wall Motion / Ef  02/17/2012  *RADIOLOGY REPORT*  Clinical Data:  Cardiac arrest with mildly elevated CPK.  Diabetes and hypertension.  MYOCARDIAL IMAGING WITH SPECT (REST AND PHARMACOLOGIC-STRESS) GATED LEFT VENTRICULAR WALL MOTION STUDY LEFT VENTRICULAR EJECTION FRACTION  Technique:  Standard myocardial SPECT imaging was performed after resting intravenous injection of 10 mCi Tc-1m tetrofosmin. Subsequently, intravenous infusion of lexiscan was performed under the supervision of the Cardiology staff.  At peak effect of the drug, 30 mCi Tc-39m tetrofosmin was injected intravenously and standard myocardial SPECT  imaging was performed.  Quantitative gated imaging was also performed to evaluate left ventricular wall motion, and estimate left ventricular ejection fraction.  Comparison:  Chest x-ray 02/13/2012.   Findings: Normal perfusion is evident during rest and stress imaging.  There is normal contractility and wall motion.  The estimated diastolic volume is 38.  The estimated systolic volume is 8.  The calculated ejection fraction is 78%.  IMPRESSION:  1.  Normal  cardiac perfusion without evidence for reversible ischemia. 2.  Normal contractility wall motion. 3.  Normal cardiac function with an estimated ejection fraction of 78%.   Original Report Authenticated By: Jamesetta Orleans. MATTERN, M.D.    Dg Chest Port 1 View  02/13/2012  *RADIOLOGY REPORT*  Clinical Data: Evaluate ET tube placement  PORTABLE CHEST - 1 VIEW  Comparison: 02/12/2012  Findings: There is been interval extubation and removal of the nasogastric tube. The left IJ catheter tip remains and is in the projection of the innominate vein.  No pleural effusion or edema.  No airspace consolidation.  Review of the visualized osseous structures is unremarkable.  IMPRESSION:  1.  Status post extubation.   Original Report Authenticated By: Rosealee Albee, M.D.    Dg Chest Port 1 View  02/12/2012  *RADIOLOGY REPORT*  Clinical Data: Ventilator dependent respiratory failure.  PORTABLE CHEST - 1 VIEW  Comparison: 02/11/2012  Findings: Endotracheal tube has been pulled back with tip now 2 cm above carina.  Nasogastric tube and left jugular center venous catheter remain in appropriate position.  Low lung volumes and mild bibasilar atelectasis show no significant change.  No evidence of pulmonary consolidation or edema.  Heart size is normal.  IMPRESSION:  1.  Low lung volumes and mild bibasilar atelectasis, without significant change. 2.  Endotracheal tube in appropriate position.   Original Report Authenticated By: Danae Orleans, M.D.    Dg Chest Port 1 View  02/11/2012  *RADIOLOGY REPORT*  Clinical Data: 71 year old male endotracheal tube advancement.  PORTABLE CHEST - 1 VIEW  Comparison: 1202 hours the same day earlier.  Findings: AP portable semi upright  view at 1403 hours. Endotracheal tube tip now above the carina.  This should be retracted for more optimal place.  Stable left IJ central line.  Enteric tube now in place, looped in the gastric body region.  Stable lung volumes.  Mildly improved bibasilar ventilation.  No confluent opacity, pneumothorax, edema, or definite effusion. Stable cardiac size and mediastinal contours.  IMPRESSION:  1.  Endotracheal tube tip now above the carina.  Withdraw 2 cm for optimal placement. 2.  Enteric tube placed, looped at the gastric body. 3. No acute cardiopulmonary abnormality.   Original Report Authenticated By: Harley Hallmark, M.D.    Dg Chest Port 1 View  02/11/2012  *RADIOLOGY REPORT*  Clinical Data: Intubation.  PORTABLE CHEST - 1 VIEW  Comparison: 02/09/2012  Findings: An endotracheal tube is in place, with tip 2.9 cm above the carina.  A left internal jugular line has been placed.  Its tip is oriented transversely and projects over the SVC.  Low lung volumes noted with mild cardiomegaly and thoracic spondylosis.  Linear subsegmental atelectasis noted at the right lung base.  IMPRESSION:  1.  ET tube tip 2.9 cm above the carina. 2.  Left IJ line tip projects over the SVC.  No pneumothorax observed. 3.  Low lung volumes. 4.  Mild atelectasis at the right lung base.   Original Report Authenticated By: Dellia Cloud, M.D.    Dg Chest Portable 1 View  02/09/2012  *RADIOLOGY REPORT*  Clinical Data: Sepsis.  Central line placement.  PORTABLE CHEST - 1 VIEW  Comparison: 6:47 hours  Findings: Left internal jugular vein center venous catheter placed. Tip is in the upper SVC.  No pneumothorax.  Normal heart size. Clear lungs.  IMPRESSION: Left internal jugular vein central venous catheter placement with its tip in the upper SVC and no pneumothorax.  Original Report Authenticated By: Donavan Burnet, M.D.   Dg Abd 2 Views  02/09/2012  *RADIOLOGY REPORT*  Clinical Data: Abdominal distention for 4 months.  Diarrhea  for 2 months.  Fever for 1 week.  Cough and congestion.  ABDOMEN - 2 VIEW  Comparison: Chest film earlier today.  Abdominal pelvic CT of 01/05/2009  Findings: Right-sided decubitus view demonstrates no free intraperitoneal air or significant air fluid levels.  Supine view demonstrates gas within normal caliber large and small bowel loops.  Distal gas.  No pneumatosis.  Phleboliths in the pelvis. No abnormal abdominal calcifications.   No appendicolith.  IMPRESSION: No acute findings.  Original Report Authenticated By: Consuello Bossier, M.D.   Ct Portable Head W/o Cm  02/11/2012  *RADIOLOGY REPORT*  Clinical Data: Cardiac arrest  CT HEAD WITHOUT CONTRAST  Technique:  Contiguous axial images were obtained from the base of the skull through the vertex without contrast.  Comparison: MRI 06/04/2011  Findings: Portable CT equipment was utilized.  Generalized atrophy.  Ventricle size is normal.  Negative for hemorrhage.  No acute infarct or mass.  IMPRESSION: No acute abnormality.   Original Report Authenticated By: Camelia Phenes, M.D.     Microbiology: Recent Results (from the past 240 hour(s))  URINE CULTURE     Status: Normal   Collection Time   02/09/12  8:52 AM      Component Value Range Status Comment   Specimen Description URINE, CLEAN CATCH   Final    Special Requests ADDED ON 02/09/12 AT 1050   Final    Culture  Setup Time 02/09/2012 16:54   Final    Colony Count >=100,000 COLONIES/ML   Final    Culture ESCHERICHIA COLI   Final    Report Status 02/11/2012 FINAL   Final    Organism ID, Bacteria ESCHERICHIA COLI   Final   CULTURE, BLOOD (ROUTINE X 2)     Status: Normal   Collection Time   02/09/12 10:41 AM      Component Value Range Status Comment   Specimen Description BLOOD LEFT ARM   Final    Special Requests BOTTLES DRAWN AEROBIC AND ANAEROBIC 10CC   Final    Culture  Setup Time 02/09/2012 16:55   Final    Culture NO GROWTH 5 DAYS   Final    Report Status 02/15/2012 FINAL   Final     CULTURE, BLOOD (ROUTINE X 2)     Status: Normal   Collection Time   02/09/12 10:46 AM      Component Value Range Status Comment   Specimen Description BLOOD LEFT HAND   Final    Special Requests BOTTLES DRAWN AEROBIC AND ANAEROBIC 10CC   Final    Culture  Setup Time 02/09/2012 16:55   Final    Culture NO GROWTH 5 DAYS   Final    Report Status 02/15/2012 FINAL   Final   MRSA PCR SCREENING     Status: Normal   Collection Time   02/09/12 12:49 PM      Component Value Range Status Comment   MRSA by PCR NEGATIVE  NEGATIVE Final   CLOSTRIDIUM DIFFICILE BY PCR     Status: Abnormal   Collection Time   02/09/12  3:18 PM      Component Value Range Status Comment   C difficile by pcr POSITIVE (*) NEGATIVE Final      Labs: Results for orders placed during the hospital encounter of 02/09/12  GLUCOSE,  CAPILLARY      Component Value Range   Glucose-Capillary 266 (*) 70 - 99 mg/dL  BASIC METABOLIC PANEL      Component Value Range   Sodium 130 (*) 135 - 145 mEq/L   Potassium 4.2  3.5 - 5.1 mEq/L   Chloride 89 (*) 96 - 112 mEq/L   CO2 25  19 - 32 mEq/L   Glucose, Bld 270 (*) 70 - 99 mg/dL   BUN 17  6 - 23 mg/dL   Creatinine, Ser 1.61  0.50 - 1.35 mg/dL   Calcium 9.8  8.4 - 09.6 mg/dL   GFR calc non Af Amer 89 (*) >90 mL/min   GFR calc Af Amer >90  >90 mL/min  CBC WITH DIFFERENTIAL      Component Value Range   WBC 14.9 (*) 4.0 - 10.5 K/uL   RBC 3.96 (*) 4.22 - 5.81 MIL/uL   Hemoglobin 13.0  13.0 - 17.0 g/dL   HCT 04.5 (*) 40.9 - 81.1 %   MCV 92.2  78.0 - 100.0 fL   MCH 32.8  26.0 - 34.0 pg   MCHC 35.6  30.0 - 36.0 g/dL   RDW 91.4 (*) 78.2 - 95.6 %   Platelets 148 (*) 150 - 400 K/uL   Neutrophils Relative 86 (*) 43 - 77 %   Neutro Abs 12.3 (*) 1.7 - 7.7 K/uL   Lymphocytes Relative 5 (*) 12 - 46 %   Lymphs Abs 0.7  0.7 - 4.0 K/uL   Monocytes Relative 10  3 - 12 %   Monocytes Absolute 1.4 (*) 0.1 - 1.0 K/uL   Eosinophils Relative 0  0 - 5 %   Eosinophils Absolute 0.0  0.0 - 0.7 K/uL    Basophils Relative 0  0 - 1 %   Basophils Absolute 0.0  0.0 - 0.1 K/uL  PHENYTOIN LEVEL, TOTAL      Component Value Range   Phenytoin Lvl 6.9 (*) 10.0 - 20.0 ug/mL  URINALYSIS, ROUTINE W REFLEX MICROSCOPIC      Component Value Range   Color, Urine YELLOW  YELLOW   APPearance TURBID (*) CLEAR   Specific Gravity, Urine 1.011  1.005 - 1.030   pH 7.0  5.0 - 8.0   Glucose, UA 100 (*) NEGATIVE mg/dL   Hgb urine dipstick LARGE (*) NEGATIVE   Bilirubin Urine NEGATIVE  NEGATIVE   Ketones, ur NEGATIVE  NEGATIVE mg/dL   Protein, ur 213 (*) NEGATIVE mg/dL   Urobilinogen, UA 0.2  0.0 - 1.0 mg/dL   Nitrite POSITIVE (*) NEGATIVE   Leukocytes, UA LARGE (*) NEGATIVE  URINE MICROSCOPIC-ADD ON      Component Value Range   Squamous Epithelial / LPF RARE  RARE   WBC, UA TOO NUMEROUS TO COUNT  <3 WBC/hpf   RBC / HPF 21-50  <3 RBC/hpf   Bacteria, UA MANY (*) RARE  LACTIC ACID, PLASMA      Component Value Range   Lactic Acid, Venous 4.1 (*) 0.5 - 2.2 mmol/L  CULTURE, BLOOD (ROUTINE X 2)      Component Value Range   Specimen Description BLOOD LEFT ARM     Special Requests BOTTLES DRAWN AEROBIC AND ANAEROBIC 10CC     Culture  Setup Time 02/09/2012 16:55     Culture NO GROWTH 5 DAYS     Report Status 02/15/2012 FINAL    CULTURE, BLOOD (ROUTINE X 2)      Component Value Range   Specimen Description BLOOD  LEFT HAND     Special Requests BOTTLES DRAWN AEROBIC AND ANAEROBIC 10CC     Culture  Setup Time 02/09/2012 16:55     Culture NO GROWTH 5 DAYS     Report Status 02/15/2012 FINAL    URINE CULTURE      Component Value Range   Specimen Description URINE, CLEAN CATCH     Special Requests ADDED ON 02/09/12 AT 1050     Culture  Setup Time 02/09/2012 16:54     Colony Count >=100,000 COLONIES/ML     Culture ESCHERICHIA COLI     Report Status 02/11/2012 FINAL     Organism ID, Bacteria ESCHERICHIA COLI    PROCALCITONIN      Component Value Range   Procalcitonin 46.35    TROPONIN I      Component  Value Range   Troponin I <0.30  <0.30 ng/mL  CK TOTAL AND CKMB      Component Value Range   Total CK 443 (*) 7 - 232 U/L   CK, MB 4.6 (*) 0.3 - 4.0 ng/mL   Relative Index 1.0  0.0 - 2.5  CLOSTRIDIUM DIFFICILE BY PCR      Component Value Range   C difficile by pcr POSITIVE (*) NEGATIVE  HEPATIC FUNCTION PANEL      Component Value Range   Total Protein 6.0  6.0 - 8.3 g/dL   Albumin 2.6 (*) 3.5 - 5.2 g/dL   AST 49 (*) 0 - 37 U/L   ALT 34  0 - 53 U/L   Alkaline Phosphatase 409 (*) 39 - 117 U/L   Total Bilirubin 1.0  0.3 - 1.2 mg/dL   Bilirubin, Direct 0.6 (*) 0.0 - 0.3 mg/dL   Indirect Bilirubin 0.4  0.3 - 0.9 mg/dL  HEMOGLOBIN Z6X      Component Value Range   Hemoglobin A1C 6.2 (*) <5.7 %   Mean Plasma Glucose 131 (*) <117 mg/dL  CBC      Component Value Range   WBC 9.0  4.0 - 10.5 K/uL   RBC 3.52 (*) 4.22 - 5.81 MIL/uL   Hemoglobin 11.4 (*) 13.0 - 17.0 g/dL   HCT 09.6 (*) 04.5 - 40.9 %   MCV 93.8  78.0 - 100.0 fL   MCH 32.4  26.0 - 34.0 pg   MCHC 34.5  30.0 - 36.0 g/dL   RDW 81.1 (*) 91.4 - 78.2 %   Platelets 86 (*) 150 - 400 K/uL  CREATININE, SERUM      Component Value Range   Creatinine, Ser 1.04  0.50 - 1.35 mg/dL   GFR calc non Af Amer 71 (*) >90 mL/min   GFR calc Af Amer 82 (*) >90 mL/min  CARDIAC PANEL(CRET KIN+CKTOT+MB+TROPI)      Component Value Range   Total CK 430 (*) 7 - 232 U/L   CK, MB 5.4 (*) 0.3 - 4.0 ng/mL   Troponin I <0.30  <0.30 ng/mL   Relative Index 1.3  0.0 - 2.5  CARDIAC PANEL(CRET KIN+CKTOT+MB+TROPI)      Component Value Range   Total CK 343 (*) 7 - 232 U/L   CK, MB 6.1 (*) 0.3 - 4.0 ng/mL   Troponin I <0.30  <0.30 ng/mL   Relative Index 1.8  0.0 - 2.5  CARDIAC PANEL(CRET KIN+CKTOT+MB+TROPI)      Component Value Range   Total CK 306 (*) 7 - 232 U/L   CK, MB 10.2 (*) 0.3 - 4.0 ng/mL   Troponin I <  0.30  <0.30 ng/mL   Relative Index 3.3 (*) 0.0 - 2.5  MRSA PCR SCREENING      Component Value Range   MRSA by PCR NEGATIVE  NEGATIVE    GLUCOSE, CAPILLARY      Component Value Range   Glucose-Capillary 205 (*) 70 - 99 mg/dL   Comment 1 Notify RN    GLUCOSE, CAPILLARY      Component Value Range   Glucose-Capillary 216 (*) 70 - 99 mg/dL   Comment 1 Notify RN     Comment 2 Documented in Chart    CORTISOL      Component Value Range   Cortisol, Plasma 70.8    TYPE AND SCREEN      Component Value Range   ABO/RH(D) O POS     Antibody Screen NEG     Sample Expiration 02/12/2012    GLUCOSE, CAPILLARY      Component Value Range   Glucose-Capillary 202 (*) 70 - 99 mg/dL  TECHNOLOGIST SMEAR REVIEW      Component Value Range   Tech Review INCREASED BANDS (>20% BANDS)    DIC (DISSEMINATED INTRAVASCULAR COAGULATION) PANEL      Component Value Range   Prothrombin Time 16.8 (*) 11.6 - 15.2 seconds   INR 1.34  0.00 - 1.49   aPTT 37  24 - 37 seconds   Fibrinogen 758 (*) 204 - 475 mg/dL   D-Dimer, Quant 1.61 (*) 0.00 - 0.48 ug/mL-FEU   Platelets 78 (*) 150 - 400 K/uL   Smear Review NO SCHISTOCYTES SEEN    LACTIC ACID, PLASMA      Component Value Range   Lactic Acid, Venous 4.9 (*) 0.5 - 2.2 mmol/L  CARBOXYHEMOGLOBIN      Component Value Range   Total hemoglobin 11.4 (*) 13.5 - 18.0 g/dL   O2 Saturation 09.6     Carboxyhemoglobin 1.2  0.5 - 1.5 %   Methemoglobin 1.0  0.0 - 1.5 %  BASIC METABOLIC PANEL      Component Value Range   Sodium 137  135 - 145 mEq/L   Potassium 3.1 (*) 3.5 - 5.1 mEq/L   Chloride 103  96 - 112 mEq/L   CO2 17 (*) 19 - 32 mEq/L   Glucose, Bld 169 (*) 70 - 99 mg/dL   BUN 21  6 - 23 mg/dL   Creatinine, Ser 0.45  0.50 - 1.35 mg/dL   Calcium 7.8 (*) 8.4 - 10.5 mg/dL   GFR calc non Af Amer 84 (*) >90 mL/min   GFR calc Af Amer >90  >90 mL/min  CBC      Component Value Range   WBC 16.7 (*) 4.0 - 10.5 K/uL   RBC 3.40 (*) 4.22 - 5.81 MIL/uL   Hemoglobin 11.1 (*) 13.0 - 17.0 g/dL   HCT 40.9 (*) 81.1 - 91.4 %   MCV 93.8  78.0 - 100.0 fL   MCH 32.6  26.0 - 34.0 pg   MCHC 34.8  30.0 - 36.0 g/dL    RDW 78.2 (*) 95.6 - 15.5 %   Platelets 86 (*) 150 - 400 K/uL  TSH      Component Value Range   TSH 1.052  0.350 - 4.500 uIU/mL  ABO/RH      Component Value Range   ABO/RH(D) O POS    CARBOXYHEMOGLOBIN      Component Value Range   Total hemoglobin 11.3 (*) 13.5 - 18.0 g/dL   O2 Saturation 61.3  Carboxyhemoglobin 1.4  0.5 - 1.5 %   Methemoglobin 1.0  0.0 - 1.5 %  CARBOXYHEMOGLOBIN      Component Value Range   Total hemoglobin 10.9 (*) 13.5 - 18.0 g/dL   O2 Saturation 16.1     Carboxyhemoglobin 1.0  0.5 - 1.5 %   Methemoglobin 1.2  0.0 - 1.5 %  GLUCOSE, CAPILLARY      Component Value Range   Glucose-Capillary 242 (*) 70 - 99 mg/dL  CARBOXYHEMOGLOBIN      Component Value Range   Total hemoglobin 10.4 (*) 13.5 - 18.0 g/dL   O2 Saturation 09.6     Carboxyhemoglobin 1.2  0.5 - 1.5 %   Methemoglobin 1.0  0.0 - 1.5 %  CARBOXYHEMOGLOBIN      Component Value Range   Total hemoglobin 11.3 (*) 13.5 - 18.0 g/dL   O2 Saturation 04.5     Carboxyhemoglobin 1.0  0.5 - 1.5 %   Methemoglobin 1.1  0.0 - 1.5 %  GLUCOSE, CAPILLARY      Component Value Range   Glucose-Capillary 188 (*) 70 - 99 mg/dL  GLUCOSE, CAPILLARY      Component Value Range   Glucose-Capillary 144 (*) 70 - 99 mg/dL  PHENYTOIN LEVEL, TOTAL      Component Value Range   Phenytoin Lvl 14.3  10.0 - 20.0 ug/mL  GLUCOSE, CAPILLARY      Component Value Range   Glucose-Capillary 111 (*) 70 - 99 mg/dL  GLUCOSE, CAPILLARY      Component Value Range   Glucose-Capillary 96  70 - 99 mg/dL  GLUCOSE, CAPILLARY      Component Value Range   Glucose-Capillary 135 (*) 70 - 99 mg/dL   Comment 1 Notify RN    BASIC METABOLIC PANEL      Component Value Range   Sodium 137  135 - 145 mEq/L   Potassium 3.3 (*) 3.5 - 5.1 mEq/L   Chloride 103  96 - 112 mEq/L   CO2 17 (*) 19 - 32 mEq/L   Glucose, Bld 153 (*) 70 - 99 mg/dL   BUN 28 (*) 6 - 23 mg/dL   Creatinine, Ser 4.09  0.50 - 1.35 mg/dL   Calcium 8.3 (*) 8.4 - 10.5 mg/dL   GFR  calc non Af Amer 86 (*) >90 mL/min   GFR calc Af Amer >90  >90 mL/min  CBC      Component Value Range   WBC 21.4 (*) 4.0 - 10.5 K/uL   RBC 3.39 (*) 4.22 - 5.81 MIL/uL   Hemoglobin 11.1 (*) 13.0 - 17.0 g/dL   HCT 81.1 (*) 91.4 - 78.2 %   MCV 93.2  78.0 - 100.0 fL   MCH 32.7  26.0 - 34.0 pg   MCHC 35.1  30.0 - 36.0 g/dL   RDW 95.6 (*) 21.3 - 08.6 %   Platelets 90 (*) 150 - 400 K/uL  MAGNESIUM      Component Value Range   Magnesium 1.7  1.5 - 2.5 mg/dL  PHOSPHORUS      Component Value Range   Phosphorus 2.1 (*) 2.3 - 4.6 mg/dL  GLUCOSE, CAPILLARY      Component Value Range   Glucose-Capillary 165 (*) 70 - 99 mg/dL  GLUCOSE, CAPILLARY      Component Value Range   Glucose-Capillary 102 (*) 70 - 99 mg/dL  GLUCOSE, CAPILLARY      Component Value Range   Glucose-Capillary 137 (*) 70 - 99 mg/dL  GLUCOSE,  CAPILLARY      Component Value Range   Glucose-Capillary 118 (*) 70 - 99 mg/dL  CBC      Component Value Range   WBC 31.6 (*) 4.0 - 10.5 K/uL   RBC 3.30 (*) 4.22 - 5.81 MIL/uL   Hemoglobin 10.7 (*) 13.0 - 17.0 g/dL   HCT 40.9 (*) 81.1 - 91.4 %   MCV 96.4  78.0 - 100.0 fL   MCH 32.4  26.0 - 34.0 pg   MCHC 33.6  30.0 - 36.0 g/dL   RDW 78.2 (*) 95.6 - 21.3 %   Platelets 100 (*) 150 - 400 K/uL  COMPREHENSIVE METABOLIC PANEL      Component Value Range   Sodium 141  135 - 145 mEq/L   Potassium 3.4 (*) 3.5 - 5.1 mEq/L   Chloride 104  96 - 112 mEq/L   CO2 13 (*) 19 - 32 mEq/L   Glucose, Bld 195 (*) 70 - 99 mg/dL   BUN 26 (*) 6 - 23 mg/dL   Creatinine, Ser 0.86  0.50 - 1.35 mg/dL   Calcium 8.3 (*) 8.4 - 10.5 mg/dL   Total Protein 6.2  6.0 - 8.3 g/dL   Albumin 2.4 (*) 3.5 - 5.2 g/dL   AST 57 (*) 0 - 37 U/L   ALT 49  0 - 53 U/L   Alkaline Phosphatase 116  39 - 117 U/L   Total Bilirubin 0.5  0.3 - 1.2 mg/dL   GFR calc non Af Amer 83 (*) >90 mL/min   GFR calc Af Amer >90  >90 mL/min  POCT I-STAT 3, BLOOD GAS (G3+)      Component Value Range   pH, Arterial 7.200 (*) 7.350 -  7.450   pCO2 arterial 27.0 (*) 35.0 - 45.0 mmHg   pO2, Arterial 228.0 (*) 80.0 - 100.0 mmHg   Bicarbonate 10.5 (*) 20.0 - 24.0 mEq/L   TCO2 11  0 - 100 mmol/L   O2 Saturation 100.0     Acid-base deficit 16.0 (*) 0.0 - 2.0 mmol/L   Collection site ARTERIAL LINE     Drawn by Operator     Sample type ARTERIAL    GLUCOSE, CAPILLARY      Component Value Range   Glucose-Capillary 170 (*) 70 - 99 mg/dL  PHENYTOIN LEVEL, TOTAL      Component Value Range   Phenytoin Lvl 15.8  10.0 - 20.0 ug/mL  ALBUMIN      Component Value Range   Albumin 2.2 (*) 3.5 - 5.2 g/dL  GLUCOSE, CAPILLARY      Component Value Range   Glucose-Capillary 176 (*) 70 - 99 mg/dL  CBC WITH DIFFERENTIAL      Component Value Range   WBC 13.3 (*) 4.0 - 10.5 K/uL   RBC 3.13 (*) 4.22 - 5.81 MIL/uL   Hemoglobin 10.3 (*) 13.0 - 17.0 g/dL   HCT 57.8 (*) 46.9 - 62.9 %   MCV 91.4  78.0 - 100.0 fL   MCH 32.9  26.0 - 34.0 pg   MCHC 36.0  30.0 - 36.0 g/dL   RDW 52.8 (*) 41.3 - 24.4 %   Platelets 94 (*) 150 - 400 K/uL   Neutrophils Relative 80 (*) 43 - 77 %   Neutro Abs 10.6 (*) 1.7 - 7.7 K/uL   Lymphocytes Relative 7 (*) 12 - 46 %   Lymphs Abs 1.0  0.7 - 4.0 K/uL   Monocytes Relative 12  3 - 12 %  Monocytes Absolute 1.6 (*) 0.1 - 1.0 K/uL   Eosinophils Relative 1  0 - 5 %   Eosinophils Absolute 0.1  0.0 - 0.7 K/uL   Basophils Relative 0  0 - 1 %   Basophils Absolute 0.0  0.0 - 0.1 K/uL  BASIC METABOLIC PANEL      Component Value Range   Sodium 139  135 - 145 mEq/L   Potassium 2.6 (*) 3.5 - 5.1 mEq/L   Chloride 107  96 - 112 mEq/L   CO2 22  19 - 32 mEq/L   Glucose, Bld 146 (*) 70 - 99 mg/dL   BUN 26 (*) 6 - 23 mg/dL   Creatinine, Ser 1.61  0.50 - 1.35 mg/dL   Calcium 8.2 (*) 8.4 - 10.5 mg/dL   GFR calc non Af Amer 84 (*) >90 mL/min   GFR calc Af Amer >90  >90 mL/min  PHOSPHORUS      Component Value Range   Phosphorus 0.5 (*) 2.3 - 4.6 mg/dL  MAGNESIUM      Component Value Range   Magnesium 1.8  1.5 - 2.5 mg/dL   GLUCOSE, CAPILLARY      Component Value Range   Glucose-Capillary 77  70 - 99 mg/dL  GLUCOSE, CAPILLARY      Component Value Range   Glucose-Capillary 179 (*) 70 - 99 mg/dL   Comment 1 Documented in Chart     Comment 2 Notify RN    GLUCOSE, CAPILLARY      Component Value Range   Glucose-Capillary 145 (*) 70 - 99 mg/dL   Comment 1 Documented in Chart     Comment 2 Notify RN    BASIC METABOLIC PANEL      Component Value Range   Sodium 148 (*) 135 - 145 mEq/L   Potassium 2.7 (*) 3.5 - 5.1 mEq/L   Chloride 109  96 - 112 mEq/L   CO2 29  19 - 32 mEq/L   Glucose, Bld 161 (*) 70 - 99 mg/dL   BUN 19  6 - 23 mg/dL   Creatinine, Ser 0.96  0.50 - 1.35 mg/dL   Calcium 8.5  8.4 - 04.5 mg/dL   GFR calc non Af Amer 82 (*) >90 mL/min   GFR calc Af Amer >90  >90 mL/min  PHOSPHORUS      Component Value Range   Phosphorus 2.6  2.3 - 4.6 mg/dL  MAGNESIUM      Component Value Range   Magnesium 1.8  1.5 - 2.5 mg/dL  HEPARIN INDUCED THROMBOCYTOPENIA PNL      Component Value Range   Heparin Induced Plt Ab Negative  Negative   Patient O.D. 0.218     UFH SRA Result Negative  Negative   UFH Low Dose 0.1 IU/mL 0     UFH Low Dose 0.5 IU/mL 0     UFH High Dose UFH H 0    GLUCOSE, CAPILLARY      Component Value Range   Glucose-Capillary 37 (*) 70 - 99 mg/dL   Comment 1 Notify RN     Comment 2 Documented in Chart    GLUCOSE, CAPILLARY      Component Value Range   Glucose-Capillary 91  70 - 99 mg/dL  GLUCOSE, CAPILLARY      Component Value Range   Glucose-Capillary 92  70 - 99 mg/dL  GLUCOSE, CAPILLARY      Component Value Range   Glucose-Capillary 151 (*) 70 - 99 mg/dL  CBC  Component Value Range   WBC 13.6 (*) 4.0 - 10.5 K/uL   RBC 3.28 (*) 4.22 - 5.81 MIL/uL   Hemoglobin 10.6 (*) 13.0 - 17.0 g/dL   HCT 45.4 (*) 09.8 - 11.9 %   MCV 91.8  78.0 - 100.0 fL   MCH 32.3  26.0 - 34.0 pg   MCHC 35.2  30.0 - 36.0 g/dL   RDW 14.7 (*) 82.9 - 56.2 %   Platelets 137 (*) 150 - 400 K/uL  BASIC  METABOLIC PANEL      Component Value Range   Sodium 146 (*) 135 - 145 mEq/L   Potassium 3.7  3.5 - 5.1 mEq/L   Chloride 108  96 - 112 mEq/L   CO2 27  19 - 32 mEq/L   Glucose, Bld 144 (*) 70 - 99 mg/dL   BUN 18  6 - 23 mg/dL   Creatinine, Ser 1.30  0.50 - 1.35 mg/dL   Calcium 8.1 (*) 8.4 - 10.5 mg/dL   GFR calc non Af Amer 84 (*) >90 mL/min   GFR calc Af Amer >90  >90 mL/min  MAGNESIUM      Component Value Range   Magnesium 1.7  1.5 - 2.5 mg/dL  PHOSPHORUS      Component Value Range   Phosphorus 3.1  2.3 - 4.6 mg/dL  PHENYTOIN LEVEL, TOTAL      Component Value Range   Phenytoin Lvl 14.9  10.0 - 20.0 ug/mL  GLUCOSE, CAPILLARY      Component Value Range   Glucose-Capillary 160 (*) 70 - 99 mg/dL  GLUCOSE, CAPILLARY      Component Value Range   Glucose-Capillary 92  70 - 99 mg/dL  GLUCOSE, CAPILLARY      Component Value Range   Glucose-Capillary 121 (*) 70 - 99 mg/dL  GLUCOSE, CAPILLARY      Component Value Range   Glucose-Capillary 130 (*) 70 - 99 mg/dL  GLUCOSE, CAPILLARY      Component Value Range   Glucose-Capillary 200 (*) 70 - 99 mg/dL  MAGNESIUM      Component Value Range   Magnesium 1.6  1.5 - 2.5 mg/dL  CBC      Component Value Range   WBC 15.3 (*) 4.0 - 10.5 K/uL   RBC 3.37 (*) 4.22 - 5.81 MIL/uL   Hemoglobin 10.8 (*) 13.0 - 17.0 g/dL   HCT 86.5 (*) 78.4 - 69.6 %   MCV 94.1  78.0 - 100.0 fL   MCH 32.0  26.0 - 34.0 pg   MCHC 34.1  30.0 - 36.0 g/dL   RDW 29.5 (*) 28.4 - 13.2 %   Platelets 207  150 - 400 K/uL  BASIC METABOLIC PANEL      Component Value Range   Sodium 140  135 - 145 mEq/L   Potassium 3.6  3.5 - 5.1 mEq/L   Chloride 103  96 - 112 mEq/L   CO2 28  19 - 32 mEq/L   Glucose, Bld 133 (*) 70 - 99 mg/dL   BUN 14  6 - 23 mg/dL   Creatinine, Ser 4.40  0.50 - 1.35 mg/dL   Calcium 8.4  8.4 - 10.2 mg/dL   GFR calc non Af Amer 84 (*) >90 mL/min   GFR calc Af Amer >90  >90 mL/min  PHOSPHORUS      Component Value Range   Phosphorus 2.7  2.3 - 4.6 mg/dL   PHENYTOIN LEVEL, TOTAL      Component Value  Range   Phenytoin Lvl 11.3  10.0 - 20.0 ug/mL  ALBUMIN      Component Value Range   Albumin 2.2 (*) 3.5 - 5.2 g/dL  GLUCOSE, CAPILLARY      Component Value Range   Glucose-Capillary 186 (*) 70 - 99 mg/dL   Comment 1 Notify RN     Comment 2 Documented in Chart    GLUCOSE, CAPILLARY      Component Value Range   Glucose-Capillary 115 (*) 70 - 99 mg/dL   Comment 1 Documented in Chart     Comment 2 Notify RN    GLUCOSE, CAPILLARY      Component Value Range   Glucose-Capillary 123 (*) 70 - 99 mg/dL   Comment 1 Documented in Chart     Comment 2 Notify RN    GLUCOSE, CAPILLARY      Component Value Range   Glucose-Capillary 137 (*) 70 - 99 mg/dL   Comment 1 Notify RN     Comment 2 Documented in Chart    GLUCOSE, CAPILLARY      Component Value Range   Glucose-Capillary 122 (*) 70 - 99 mg/dL   Comment 1 Notify RN    GLUCOSE, CAPILLARY      Component Value Range   Glucose-Capillary 109 (*) 70 - 99 mg/dL   Comment 1 Notify RN    PHENYTOIN LEVEL, TOTAL      Component Value Range   Phenytoin Lvl 7.0 (*) 10.0 - 20.0 ug/mL  ALBUMIN      Component Value Range   Albumin 2.6 (*) 3.5 - 5.2 g/dL  GLUCOSE, CAPILLARY      Component Value Range   Glucose-Capillary 136 (*) 70 - 99 mg/dL   Comment 1 Notify RN    GLUCOSE, CAPILLARY      Component Value Range   Glucose-Capillary 100 (*) 70 - 99 mg/dL  GLUCOSE, CAPILLARY      Component Value Range   Glucose-Capillary 114 (*) 70 - 99 mg/dL  CBC      Component Value Range   WBC 13.7 (*) 4.0 - 10.5 K/uL   RBC 3.70 (*) 4.22 - 5.81 MIL/uL   Hemoglobin 12.0 (*) 13.0 - 17.0 g/dL   HCT 84.6 (*) 96.2 - 95.2 %   MCV 94.3  78.0 - 100.0 fL   MCH 32.4  26.0 - 34.0 pg   MCHC 34.4  30.0 - 36.0 g/dL   RDW 84.1 (*) 32.4 - 40.1 %   Platelets 327  150 - 400 K/uL  GLUCOSE, CAPILLARY      Component Value Range   Glucose-Capillary 186 (*) 70 - 99 mg/dL  GLUCOSE, CAPILLARY      Component Value Range    Glucose-Capillary 142 (*) 70 - 99 mg/dL  GLUCOSE, CAPILLARY      Component Value Range   Glucose-Capillary 135 (*) 70 - 99 mg/dL  GLUCOSE, CAPILLARY      Component Value Range   Glucose-Capillary 134 (*) 70 - 99 mg/dL  GLUCOSE, CAPILLARY      Component Value Range   Glucose-Capillary 149 (*) 70 - 99 mg/dL  GLUCOSE, CAPILLARY      Component Value Range   Glucose-Capillary 128 (*) 70 - 99 mg/dL  GLUCOSE, CAPILLARY      Component Value Range   Glucose-Capillary 130 (*) 70 - 99 mg/dL   Comment 1 Notify RN    GLUCOSE, CAPILLARY      Component Value Range   Glucose-Capillary 140 (*) 70 -  99 mg/dL   Comment 1 Notify RN    GLUCOSE, CAPILLARY      Component Value Range   Glucose-Capillary 132 (*) 70 - 99 mg/dL   Comment 1 Notify RN    GLUCOSE, CAPILLARY      Component Value Range   Glucose-Capillary 109 (*) 70 - 99 mg/dL  GLUCOSE, CAPILLARY      Component Value Range   Glucose-Capillary 113 (*) 70 - 99 mg/dL   Comment 1 Notify RN       Signed: Arienne Gartin NEVILL 02/18/2012, 8:18 AM

## 2012-02-18 NOTE — Care Management Note (Signed)
    Page 1 of 2   02/18/2012     10:52:38 AM   CARE MANAGEMENT NOTE 02/18/2012  Patient:  Joseph Copeland, Joseph Copeland   Account Number:  [id number]  Date Initiated:  02/11/2012  Documentation initiated by:  [facility]  Subjective/Objective Assessment:   Initially admitted with SIRS, AMS.  PEA arrest - intubated.     Action/Plan:   PTA pt lived at home with spouse, independent- PT eval, CIR consult   Anticipated DC Date:  02/18/2012   Anticipated DC Plan:  HOSPICE MEDICAL FACILITY      DC Planning Services  CM consult  CM consult      Choice offered to / List presented to:     DME arranged  [facility]      DME agency  [facility]     HH arranged  [facility]2 PT      [facility] agency  [facility]   Status of service:  Completed, signed off Medicare Important Message given?   (If response is "NO", the following Medicare IM given date fields will be blank) Date Medicare IM given:   Date Additional Medicare IM given:    Discharge Disposition:  HOME W HOME HEALTH SERVICES  Per UR Regulation:  Reviewed for med. necessity/level of care/duration of stay  If discussed at Long Length of Stay Meetings, dates discussed:   02/18/2012    Comments:  Contact: Klute,Laura Spouse [phone]               Ratliffe,Marlys Daughter [phone] 02-18-12 [address], [place] [phone] CM  spoke to family and they chose [facility] for services. SOC to begin within 24-48 hours post d/c. DME to be delivered to room.  02/14/12- 1100- Donn Pierini RN, BSN [phone] CIR consulted- d/c planning CIR vs Home with HH (PT recommending RW for home)- pt much improved to tx to tele floor today. NCM to cont. to follow for d/c planning and needs  02/13/12 11:30am Avie Arenas, RNBSN [phone] Extubated on 8-21 - doing very well. Plan for discharge home when medically ready.

## 2012-02-18 NOTE — Progress Notes (Signed)
PT Cancellation Note  Treatment cancelled today due to pt declining PT secondary to being D/C'd today.  If does not D/C will f/u later.    Sunny Schlein, [place] [phone] 02/18/2012, 11:03 AM

## 2012-02-19 DIAGNOSIS — E119 Type 2 diabetes mellitus without complications: Secondary | ICD-10-CM | POA: Diagnosis not present

## 2012-02-19 DIAGNOSIS — N390 Urinary tract infection, site not specified: Secondary | ICD-10-CM | POA: Diagnosis not present

## 2012-02-19 DIAGNOSIS — A498 Other bacterial infections of unspecified site: Secondary | ICD-10-CM | POA: Diagnosis not present

## 2012-02-19 DIAGNOSIS — IMO0001 Reserved for inherently not codable concepts without codable children: Secondary | ICD-10-CM | POA: Diagnosis not present

## 2012-02-19 DIAGNOSIS — I10 Essential (primary) hypertension: Secondary | ICD-10-CM | POA: Diagnosis not present

## 2012-02-19 DIAGNOSIS — R269 Unspecified abnormalities of gait and mobility: Secondary | ICD-10-CM | POA: Diagnosis not present

## 2012-02-20 ENCOUNTER — Other Ambulatory Visit: Payer: Self-pay | Admitting: Internal Medicine

## 2012-02-20 DIAGNOSIS — N401 Enlarged prostate with lower urinary tract symptoms: Secondary | ICD-10-CM | POA: Diagnosis not present

## 2012-02-20 DIAGNOSIS — N390 Urinary tract infection, site not specified: Secondary | ICD-10-CM | POA: Diagnosis not present

## 2012-02-20 DIAGNOSIS — R52 Pain, unspecified: Secondary | ICD-10-CM

## 2012-02-21 DIAGNOSIS — R269 Unspecified abnormalities of gait and mobility: Secondary | ICD-10-CM | POA: Diagnosis not present

## 2012-02-21 DIAGNOSIS — E119 Type 2 diabetes mellitus without complications: Secondary | ICD-10-CM | POA: Diagnosis not present

## 2012-02-21 DIAGNOSIS — IMO0001 Reserved for inherently not codable concepts without codable children: Secondary | ICD-10-CM | POA: Diagnosis not present

## 2012-02-21 DIAGNOSIS — I10 Essential (primary) hypertension: Secondary | ICD-10-CM | POA: Diagnosis not present

## 2012-02-21 DIAGNOSIS — A498 Other bacterial infections of unspecified site: Secondary | ICD-10-CM | POA: Diagnosis not present

## 2012-02-21 DIAGNOSIS — N390 Urinary tract infection, site not specified: Secondary | ICD-10-CM | POA: Diagnosis not present

## 2012-02-24 DIAGNOSIS — IMO0001 Reserved for inherently not codable concepts without codable children: Secondary | ICD-10-CM | POA: Diagnosis not present

## 2012-02-24 DIAGNOSIS — N390 Urinary tract infection, site not specified: Secondary | ICD-10-CM | POA: Diagnosis not present

## 2012-02-24 DIAGNOSIS — R269 Unspecified abnormalities of gait and mobility: Secondary | ICD-10-CM | POA: Diagnosis not present

## 2012-02-24 DIAGNOSIS — A498 Other bacterial infections of unspecified site: Secondary | ICD-10-CM | POA: Diagnosis not present

## 2012-02-24 DIAGNOSIS — I10 Essential (primary) hypertension: Secondary | ICD-10-CM | POA: Diagnosis not present

## 2012-02-24 DIAGNOSIS — E119 Type 2 diabetes mellitus without complications: Secondary | ICD-10-CM | POA: Diagnosis not present

## 2012-02-25 DIAGNOSIS — R630 Anorexia: Secondary | ICD-10-CM | POA: Diagnosis not present

## 2012-02-25 DIAGNOSIS — J3489 Other specified disorders of nose and nasal sinuses: Secondary | ICD-10-CM | POA: Diagnosis not present

## 2012-02-25 DIAGNOSIS — E291 Testicular hypofunction: Secondary | ICD-10-CM | POA: Diagnosis not present

## 2012-02-25 DIAGNOSIS — R569 Unspecified convulsions: Secondary | ICD-10-CM | POA: Diagnosis not present

## 2012-02-26 DIAGNOSIS — I10 Essential (primary) hypertension: Secondary | ICD-10-CM | POA: Diagnosis not present

## 2012-02-26 DIAGNOSIS — R05 Cough: Secondary | ICD-10-CM | POA: Diagnosis not present

## 2012-02-26 DIAGNOSIS — R269 Unspecified abnormalities of gait and mobility: Secondary | ICD-10-CM | POA: Diagnosis not present

## 2012-02-26 DIAGNOSIS — N390 Urinary tract infection, site not specified: Secondary | ICD-10-CM | POA: Diagnosis not present

## 2012-02-26 DIAGNOSIS — G4733 Obstructive sleep apnea (adult) (pediatric): Secondary | ICD-10-CM | POA: Diagnosis not present

## 2012-02-26 DIAGNOSIS — A498 Other bacterial infections of unspecified site: Secondary | ICD-10-CM | POA: Diagnosis not present

## 2012-02-26 DIAGNOSIS — J342 Deviated nasal septum: Secondary | ICD-10-CM | POA: Diagnosis not present

## 2012-02-26 DIAGNOSIS — E119 Type 2 diabetes mellitus without complications: Secondary | ICD-10-CM | POA: Diagnosis not present

## 2012-02-26 DIAGNOSIS — R059 Cough, unspecified: Secondary | ICD-10-CM | POA: Diagnosis not present

## 2012-02-26 DIAGNOSIS — IMO0001 Reserved for inherently not codable concepts without codable children: Secondary | ICD-10-CM | POA: Diagnosis not present

## 2012-02-26 DIAGNOSIS — J343 Hypertrophy of nasal turbinates: Secondary | ICD-10-CM | POA: Diagnosis not present

## 2012-02-28 DIAGNOSIS — IMO0001 Reserved for inherently not codable concepts without codable children: Secondary | ICD-10-CM | POA: Diagnosis not present

## 2012-02-28 DIAGNOSIS — A498 Other bacterial infections of unspecified site: Secondary | ICD-10-CM | POA: Diagnosis not present

## 2012-02-28 DIAGNOSIS — E119 Type 2 diabetes mellitus without complications: Secondary | ICD-10-CM | POA: Diagnosis not present

## 2012-02-28 DIAGNOSIS — N390 Urinary tract infection, site not specified: Secondary | ICD-10-CM | POA: Diagnosis not present

## 2012-02-28 DIAGNOSIS — I10 Essential (primary) hypertension: Secondary | ICD-10-CM | POA: Diagnosis not present

## 2012-02-28 DIAGNOSIS — R269 Unspecified abnormalities of gait and mobility: Secondary | ICD-10-CM | POA: Diagnosis not present

## 2012-03-02 DIAGNOSIS — I10 Essential (primary) hypertension: Secondary | ICD-10-CM | POA: Diagnosis not present

## 2012-03-02 DIAGNOSIS — A498 Other bacterial infections of unspecified site: Secondary | ICD-10-CM | POA: Diagnosis not present

## 2012-03-02 DIAGNOSIS — E119 Type 2 diabetes mellitus without complications: Secondary | ICD-10-CM | POA: Diagnosis not present

## 2012-03-02 DIAGNOSIS — R269 Unspecified abnormalities of gait and mobility: Secondary | ICD-10-CM | POA: Diagnosis not present

## 2012-03-02 DIAGNOSIS — N390 Urinary tract infection, site not specified: Secondary | ICD-10-CM | POA: Diagnosis not present

## 2012-03-02 DIAGNOSIS — IMO0001 Reserved for inherently not codable concepts without codable children: Secondary | ICD-10-CM | POA: Diagnosis not present

## 2012-03-04 DIAGNOSIS — IMO0001 Reserved for inherently not codable concepts without codable children: Secondary | ICD-10-CM | POA: Diagnosis not present

## 2012-03-04 DIAGNOSIS — I10 Essential (primary) hypertension: Secondary | ICD-10-CM | POA: Diagnosis not present

## 2012-03-04 DIAGNOSIS — N390 Urinary tract infection, site not specified: Secondary | ICD-10-CM | POA: Diagnosis not present

## 2012-03-04 DIAGNOSIS — E119 Type 2 diabetes mellitus without complications: Secondary | ICD-10-CM | POA: Diagnosis not present

## 2012-03-04 DIAGNOSIS — A498 Other bacterial infections of unspecified site: Secondary | ICD-10-CM | POA: Diagnosis not present

## 2012-03-04 DIAGNOSIS — R269 Unspecified abnormalities of gait and mobility: Secondary | ICD-10-CM | POA: Diagnosis not present

## 2012-03-05 ENCOUNTER — Encounter (HOSPITAL_COMMUNITY): Payer: Self-pay | Admitting: Emergency Medicine

## 2012-03-05 ENCOUNTER — Inpatient Hospital Stay (HOSPITAL_COMMUNITY)
Admission: EM | Admit: 2012-03-05 | Discharge: 2012-03-07 | DRG: 872 | Disposition: A | Discharge status: Home / Self Care | Payer: Medicare Other | Attending: Internal Medicine | Admitting: Internal Medicine

## 2012-03-05 ENCOUNTER — Emergency Department (HOSPITAL_COMMUNITY): Payer: Medicare Other

## 2012-03-05 DIAGNOSIS — G4730 Sleep apnea, unspecified: Secondary | ICD-10-CM | POA: Diagnosis present

## 2012-03-05 DIAGNOSIS — K7689 Other specified diseases of liver: Secondary | ICD-10-CM | POA: Diagnosis not present

## 2012-03-05 DIAGNOSIS — R509 Fever, unspecified: Secondary | ICD-10-CM | POA: Diagnosis not present

## 2012-03-05 DIAGNOSIS — E119 Type 2 diabetes mellitus without complications: Secondary | ICD-10-CM | POA: Diagnosis not present

## 2012-03-05 DIAGNOSIS — R059 Cough, unspecified: Secondary | ICD-10-CM | POA: Diagnosis not present

## 2012-03-05 DIAGNOSIS — D649 Anemia, unspecified: Secondary | ICD-10-CM

## 2012-03-05 DIAGNOSIS — R6510 Systemic inflammatory response syndrome (SIRS) of non-infectious origin without acute organ dysfunction: Secondary | ICD-10-CM

## 2012-03-05 DIAGNOSIS — A498 Other bacterial infections of unspecified site: Secondary | ICD-10-CM | POA: Diagnosis present

## 2012-03-05 DIAGNOSIS — G40909 Epilepsy, unspecified, not intractable, without status epilepticus: Secondary | ICD-10-CM | POA: Diagnosis present

## 2012-03-05 DIAGNOSIS — I10 Essential (primary) hypertension: Secondary | ICD-10-CM | POA: Diagnosis not present

## 2012-03-05 DIAGNOSIS — E118 Type 2 diabetes mellitus with unspecified complications: Secondary | ICD-10-CM | POA: Diagnosis present

## 2012-03-05 DIAGNOSIS — K828 Other specified diseases of gallbladder: Secondary | ICD-10-CM | POA: Diagnosis not present

## 2012-03-05 DIAGNOSIS — N390 Urinary tract infection, site not specified: Secondary | ICD-10-CM | POA: Diagnosis not present

## 2012-03-05 DIAGNOSIS — R569 Unspecified convulsions: Secondary | ICD-10-CM | POA: Diagnosis present

## 2012-03-05 DIAGNOSIS — R05 Cough: Secondary | ICD-10-CM | POA: Diagnosis not present

## 2012-03-05 DIAGNOSIS — B9689 Other specified bacterial agents as the cause of diseases classified elsewhere: Secondary | ICD-10-CM | POA: Diagnosis not present

## 2012-03-05 DIAGNOSIS — R7881 Bacteremia: Principal | ICD-10-CM | POA: Diagnosis present

## 2012-03-05 DIAGNOSIS — K5730 Diverticulosis of large intestine without perforation or abscess without bleeding: Secondary | ICD-10-CM | POA: Diagnosis not present

## 2012-03-05 LAB — COMPREHENSIVE METABOLIC PANEL
BUN: 5 mg/dL — ABNORMAL LOW (ref 6–23)
CO2: 26 mEq/L (ref 19–32)
Chloride: 93 mEq/L — ABNORMAL LOW (ref 96–112)
Creatinine, Ser: 0.68 mg/dL (ref 0.50–1.35)
GFR calc Af Amer: >90 mL/min (ref >90)
GFR calc non Af Amer: >90 mL/min (ref >90)
Glucose, Bld: 137 mg/dL — ABNORMAL HIGH (ref 70–99)
Total Bilirubin: 0.6 mg/dL (ref 0.3–1.2)

## 2012-03-05 LAB — CBC WITH DIFFERENTIAL/PLATELET
Basophils Relative: 0 % (ref 0–1)
HCT: 35.5 % — ABNORMAL LOW (ref 39.0–52.0)
Hemoglobin: 11.9 g/dL — ABNORMAL LOW (ref 13.0–17.0)
Lymphocytes Relative: 9 % — ABNORMAL LOW (ref 12–46)
Lymphs Abs: 0.8 10*3/uL (ref 0.7–4.0)
MCHC: 33.5 g/dL (ref 30.0–36.0)
Monocytes Absolute: 0.8 10*3/uL (ref 0.1–1.0)
Monocytes Relative: 8 % (ref 3–12)
Neutro Abs: 7.7 10*3/uL (ref 1.7–7.7)

## 2012-03-05 LAB — LACTIC ACID, PLASMA: Lactic Acid, Venous: 6.4 mmol/L — ABNORMAL HIGH (ref 0.5–2.2)

## 2012-03-05 LAB — URINALYSIS, ROUTINE W REFLEX MICROSCOPIC
Glucose, UA: NEGATIVE mg/dL
Ketones, ur: 15 mg/dL — AB
pH: 6.5 (ref 5.0–8.0)

## 2012-03-05 LAB — URINE MICROSCOPIC-ADD ON

## 2012-03-05 MED ORDER — PIPERACILLIN-TAZOBACTAM 3.375 G IVPB
3.3750 g | Freq: Once | INTRAVENOUS | Status: AC
  Administered 2012-03-06: 3.375 g via INTRAVENOUS
  Filled 2012-03-05: qty 50

## 2012-03-05 MED ORDER — SODIUM CHLORIDE 0.9 % IJ SOLN
3.0000 mL | Freq: Two times a day (BID) | INTRAMUSCULAR | Status: DC
  Administered 2012-03-06 – 2012-03-07 (×2): 3 mL via INTRAVENOUS

## 2012-03-05 MED ORDER — INSULIN ASPART 100 UNIT/ML ~~LOC~~ SOLN
0.0000 [IU] | Freq: Three times a day (TID) | SUBCUTANEOUS | Status: DC
  Administered 2012-03-06: 2 [IU] via SUBCUTANEOUS
  Administered 2012-03-07: 3 [IU] via SUBCUTANEOUS

## 2012-03-05 MED ORDER — HYDRALAZINE HCL 20 MG/ML IJ SOLN
10.0000 mg | Freq: Four times a day (QID) | INTRAMUSCULAR | Status: DC | PRN
  Filled 2012-03-05: qty 0.5

## 2012-03-05 MED ORDER — VANCOMYCIN HCL IN DEXTROSE 1-5 GM/200ML-% IV SOLN
1000.0000 mg | Freq: Once | INTRAVENOUS | Status: AC
  Administered 2012-03-05: 1000 mg via INTRAVENOUS
  Filled 2012-03-05: qty 200

## 2012-03-05 MED ORDER — VITAMIN B-12 100 MCG PO TABS
100.0000 ug | ORAL_TABLET | Freq: Every day | ORAL | Status: DC
  Administered 2012-03-06 – 2012-03-07 (×2): 100 ug via ORAL
  Filled 2012-03-05 (×2): qty 1

## 2012-03-05 MED ORDER — SODIUM CHLORIDE 0.9 % IV BOLUS (SEPSIS)
1000.0000 mL | Freq: Once | INTRAVENOUS | Status: AC
  Administered 2012-03-05: 1000 mL via INTRAVENOUS

## 2012-03-05 MED ORDER — VITAMIN E 45 MG (100 UNIT) PO CAPS
100.0000 [IU] | ORAL_CAPSULE | Freq: Every day | ORAL | Status: DC
  Administered 2012-03-06 – 2012-03-07 (×2): 100 [IU] via ORAL
  Filled 2012-03-05 (×2): qty 1

## 2012-03-05 MED ORDER — ONDANSETRON HCL 4 MG PO TABS: 4.0000 mg | ORAL_TABLET | Freq: Four times a day (QID) | ORAL | Status: DC | PRN

## 2012-03-05 MED ORDER — PHENYTOIN 50 MG PO CHEW
50.0000 mg | CHEWABLE_TABLET | Freq: Two times a day (BID) | ORAL | Status: DC
  Administered 2012-03-06: 50 mg via ORAL
  Filled 2012-03-05 (×3): qty 1

## 2012-03-05 MED ORDER — ACETAMINOPHEN 650 MG RE SUPP: 650.0000 mg | Freq: Four times a day (QID) | RECTAL | Status: DC | PRN

## 2012-03-05 MED ORDER — TESTOSTERONE 50 MG/5GM (1%) TD GEL
5.0000 g | Freq: Every day | TRANSDERMAL | Status: DC
  Administered 2012-03-07: 5 g via TRANSDERMAL
  Filled 2012-03-05: qty 5

## 2012-03-05 MED ORDER — ACETAMINOPHEN 325 MG PO TABS: 650.0000 mg | ORAL_TABLET | Freq: Four times a day (QID) | ORAL | Status: DC | PRN

## 2012-03-05 MED ORDER — METOPROLOL TARTRATE 25 MG PO TABS
25.0000 mg | ORAL_TABLET | Freq: Every day | ORAL | Status: DC
  Administered 2012-03-06 – 2012-03-07 (×2): 25 mg via ORAL
  Filled 2012-03-05 (×2): qty 1

## 2012-03-05 MED ORDER — INSULIN ASPART 100 UNIT/ML ~~LOC~~ SOLN
0.0000 [IU] | Freq: Every day | SUBCUTANEOUS | Status: DC
  Filled 2012-03-05: qty 1

## 2012-03-05 MED ORDER — SODIUM CHLORIDE 0.9 % IV SOLN
INTRAVENOUS | Status: DC
  Administered 2012-03-06: 75 mL/h via INTRAVENOUS
  Administered 2012-03-06 – 2012-03-07 (×2): via INTRAVENOUS

## 2012-03-05 MED ORDER — ONDANSETRON HCL 4 MG/2ML IJ SOLN
4.0000 mg | Freq: Four times a day (QID) | INTRAMUSCULAR | Status: DC | PRN
  Administered 2012-03-06: 4 mg via INTRAVENOUS
  Filled 2012-03-05: qty 2

## 2012-03-05 MED ORDER — ADULT MULTIVITAMIN W/MINERALS CH
1.0000 | ORAL_TABLET | Freq: Every day | ORAL | Status: DC
  Administered 2012-03-06 – 2012-03-07 (×2): 1 via ORAL
  Filled 2012-03-05 (×2): qty 1

## 2012-03-05 MED ORDER — SODIUM CHLORIDE 0.9 % IV BOLUS (SEPSIS): 500.0000 mL | INTRAVENOUS | Status: DC

## 2012-03-05 MED ORDER — TAMSULOSIN HCL 0.4 MG PO CAPS
0.4000 mg | ORAL_CAPSULE | Freq: Every day | ORAL | Status: DC
  Administered 2012-03-06 – 2012-03-07 (×2): 0.4 mg via ORAL
  Filled 2012-03-05 (×2): qty 1

## 2012-03-05 MED ORDER — METFORMIN HCL ER 750 MG PO TB24: 750.0000 mg | ORAL_TABLET | Freq: Every day | ORAL | Status: DC

## 2012-03-05 NOTE — H&P (Addendum)
[facility] History and Physical  Joseph Copeland WUX:[id number] DOB: [birth year] DOA: 03/05/2012  Referring physician: ER physician PCP: Pearla Dubonnet, MD   Chief Complaint: urinary tract infection  HPI:  71 year old male with medical history significant for hypertension, anemia, seizure disorder and recent E. Coli UTI who initially went to urgent care center for fevers at home, Tmax being 101.48F. Patient did not have any other symptoms however on evaluation in urgent care center he was found to have urinary tract infection and was sent to [facility] ED. Patient has no other significant symptoms such as hematuria, dysuria or urgency. No complaints of abdominal pain, no nausea or vomiting, no lightheadedness or dizziness or loss of consciousness. No chest pain, no shortness of breath and no palpitations.  Assessment and Plan:  Principal Problem:  *UTI (lower urinary tract infection) - patient was started on broad spectrum antibiotics for fever and possible sepsis, vanco and zosyn - pt was apparently recently admitted with similar symptoms and has developed sepsis - it is unclear if UTI is the source of his fever but will I agree covering broadly for now and narrowing down as possible - the antibiotic choice can be narrowed down once we have blood and urine culture results - lactic acid and procalcitonin results are pending  Active Problems:  Seizures, generalized convulsive - continue dilantin per home regimen   HTN (hypertension) with tachycardia - continue metoprolol - added hydralazine PRN IV for better BP control - if BP allows may need to increase the dose of Metoprolol for better HR but I think with IVF HR will improve  Diabetes Mellitus - controlled base on A1C on recent admission - hold metformin - start sliding scale insulin   Anemia - hemoglobin 11.9 on admission - no signs of active bleed - will continue to monitor  Code Status: Full Family Communication: Pt at  bedside Disposition Plan: Admit for further evaluation  Danie Binder, MD  [facility] Pager [phone]  If 7PM-7AM, please contact night-coverage [email or web address] Password [email or web address] 03/05/2012, 10:38 PM   Review of Systems:  Constitutional: positive for fever,  No chills and malaise/fatigue. Negative for diaphoresis.  HENT: Negative for hearing loss, ear pain, nosebleeds, congestion, sore throat, neck pain, tinnitus and ear discharge.   Eyes: Negative for blurred vision, double vision, photophobia, pain, discharge and redness.  Respiratory: Negative for cough, hemoptysis, sputum production, shortness of breath, wheezing and stridor.   Cardiovascular: Negative for chest pain, palpitations, orthopnea, claudication and leg swelling.  Gastrointestinal: Negative for nausea, vomiting and abdominal pain. Negative for heartburn, constipation, blood in stool and melena.  Genitourinary: Negative for dysuria, urgency, frequency, hematuria and flank pain.  Musculoskeletal: Negative for myalgias, back pain, joint pain and falls.  Skin: Negative for itching and rash.  Neurological: Negative for dizziness and weakness. Negative for tingling, tremors, sensory change, speech change, focal weakness, loss of consciousness and headaches.  Endo/Heme/Allergies: Negative for environmental allergies and polydipsia. Does not bruise/bleed easily.  Psychiatric/Behavioral: Negative for suicidal ideas. The patient is not nervous/anxious.      Past Medical History  Diagnosis Date  . Hypertension   . Arthritis   . Seizures     first12/12-none since  . Diabetes mellitus     type 2-no meds  . Sleep apnea     nasal CPAP at [facility]   Past Surgical History  Procedure Date  . Hemrhoidectomy   . Colonoscopy   . Mass excision 10/07/2011    Procedure: EXCISION MASS;  Surgeon: Louisa Second, MD;  Location: [facility];  Service: Plastics;  Laterality: Left;  excision of large mass on left back with  possible lipo assistence  . Liposuction 10/07/2011    Procedure: LIPOSUCTION;  Surgeon: Louisa Second, MD;  Location: [facility];  Service: Plastics;  Laterality: Left;   Social History:  reports that he quit smoking about 45 years ago. He has never used smokeless tobacco. He reports that he drinks about 12.6 ounces of alcohol per week. He reports that he does not use illicit drugs.  No Known Allergies  Family History: htn in mother  Medication Sig  Cyanocobalamin (VITAMIN B 12 PO) Take 1 tablet by mouth daily.    metFORMIN (GLUCOPHAGE-XR) 750 MG 24 hr tablet Take 750 mg by mouth daily with breakfast.  metoprolol tartrate (LOPRESSOR) 25 MG tablet Take 25 mg by mouth daily.  Milk Thistle 1000 MG CAPS Take 1 tablet by mouth daily.  Multiple Vitamin (MULTIVITAMIN WITH MINERALS) TABS Take 1 tablet by mouth daily.  phenytoin (DILANTIN) 50 MG tablet Chew 50 mg by mouth 2 (two) times daily. Takes 150 mg during the day and 200 mg at night, and on Monday, Wednesday and Fridays he takes 300 mg  saw palmetto 160 MG capsule Take 160 mg by mouth 2 (two) times daily.  Tamsulosin HCl (FLOMAX) 0.4 MG CAPS Take 0.4 mg by mouth daily.  testosterone (ANDROGEL) 50 MG/5GM GEL Place 5 g onto the skin daily.  vitamin E 100 UNIT capsule Take 100 Units by mouth daily.   Physical Exam: Filed Vitals:   03/05/12 1923 03/05/12 2114  BP: 154/90 153/110  Pulse: 110 118  Temp: 100 F (37.8 C) 99.7 F (37.6 C)  TempSrc: Oral Oral  Resp: 20 18  SpO2: 96% 100%    Physical Exam  General Appearance: Alert, cooperative, no acute distress, appears stated age  Head: Normocephalic, without obvious abnormality, atraumatic  Neck: Supple, no JVD appreciated  Lungs: Clear to auscultation bilaterally, respirations unlabored  Heart: Regular rhythm, tachycardic, S1 and S2 normal, no murmur, rub or gallop  Abdomen: Soft, non-tender, bowel sounds appreciated  in all four quadrants, no masses, no organomegaly   Extremities: Extremities normal, atraumatic, no cyanosis or edema  Pulses: 2+ and symmetric all extremities  Skin: Skin color, texture, turgor normal, no rashes or lesions  Neuro: CNII-XII intact. Normal strength, sensation and reflexes throughout    Labs on Admission:  Basic Metabolic Panel:  Lab 03/05/12 4098  NA 135  K 3.8  CL 93*  CO2 26  GLUCOSE 137*  BUN 5*  CREATININE 0.68  CALCIUM 9.5  MG --  PHOS --   Liver Function Tests:  Lab 03/05/12 2001  AST 20  ALT 13  ALKPHOS 231*  BILITOT 0.6  PROT 7.7  ALBUMIN 3.4*   CBC:  Lab 03/05/12 2001  WBC 9.3  NEUTROABS 7.7  HGB 11.9*  HCT 35.5*  MCV 97.0  PLT 331    Radiological Exams on Admission: Dg Chest 2 View 03/05/2012   IMPRESSION: No acute abnormality.      EKG: Sinus tachycardia    Time spent: 85 minutes

## 2012-03-05 NOTE — ED Provider Notes (Signed)
History     CSN: [id number]  Arrival date & time 03/05/12  Joseph Copeland   First MD Initiated Contact with Patient 03/05/12 2148      Chief Complaint  Patient presents with  . Fever    (Consider location/radiation/quality/duration/timing/severity/associated sxs/prior treatment) Patient is a 70 y.o. male presenting with fever.  Fever Primary symptoms of the febrile illness include fever. Primary symptoms do not include headaches, shortness of breath, abdominal pain, nausea, vomiting, diarrhea, dysuria or rash.   patient presents with fatigue and fever. Recently was admitted for sepsis from a urinary tract infection. Was seen at urgent care. Totally urine tract infection and needed to come to the hospital for admission. Patient states he just feels weak all over. No clear dysuria.  Past Medical History  Diagnosis Date  . Hypertension   . Arthritis   . Seizures     first12/12-none since  . Diabetes mellitus     type 2-no meds  . Sleep apnea     nasal CPAP at [facility]    Past Surgical History  Procedure Date  . Hemrhoidectomy   . Colonoscopy   . Mass excision 10/07/2011    Procedure: EXCISION MASS;  Surgeon: Louisa Second, MD;  Location: [facility];  Service: Plastics;  Laterality: Left;  excision of large mass on left back with possible lipo assistence  . Liposuction 10/07/2011    Procedure: LIPOSUCTION;  Surgeon: Louisa Second, MD;  Location: [facility];  Service: Plastics;  Laterality: Left;    History reviewed. No pertinent family history.  History  Substance Use Topics  . Smoking status: Former Smoker    Quit date: 10/03/1966  . Smokeless tobacco: Never Used   Comment: quit smoking 1971  . Alcohol Use: 12.6 oz/week    21 Shots of liquor per week     occ      Review of Systems  Constitutional: Positive for fever. Negative for activity change and appetite change.  HENT: Negative for neck stiffness.   Eyes: Negative for pain.  Respiratory:  Negative for chest tightness and shortness of breath.   Cardiovascular: Negative for chest pain and leg swelling.  Gastrointestinal: Negative for nausea, vomiting, abdominal pain and diarrhea.  Genitourinary: Negative for dysuria and flank pain.  Musculoskeletal: Negative for back pain.  Skin: Negative for rash.  Neurological: Positive for light-headedness. Negative for weakness, numbness and headaches.  Psychiatric/Behavioral: Negative for behavioral problems.     Allergies  Review of patient's allergies indicates no known allergies.  Home Medications   Current Outpatient Rx  Name Route Sig Dispense Refill  . VITAMIN B 12 PO Oral Take 1 tablet by mouth daily.      Marland Kitchen METFORMIN HCL ER 750 MG PO TB24 Oral Take 750 mg by mouth daily with breakfast.    . METOPROLOL TARTRATE 25 MG PO TABS Oral Take 25 mg by mouth daily.    Marland Kitchen MILK THISTLE 1000 MG PO CAPS Oral Take 1 tablet by mouth daily.    . ADULT MULTIVITAMIN W/MINERALS CH Oral Take 1 tablet by mouth daily.    Marland Kitchen PHENYTOIN 50 MG PO CHEW Oral Chew 50 mg by mouth 2 (two) times daily. Takes 150 mg during the day and 200 mg at night, and on Monday, Wednesday and Fridays he takes 300 mg    . SAW PALMETTO (SERENOA REPENS) 160 MG PO CAPS Oral Take 160 mg by mouth 2 (two) times daily.    Marland Kitchen TAMSULOSIN HCL 0.4 MG PO  CAPS Oral Take 0.4 mg by mouth daily.    . TESTOSTERONE 50 MG/5GM TD GEL Transdermal Place 5 g onto the skin daily.    Marland Kitchen VITAMIN E 100 UNITS PO CAPS Oral Take 100 Units by mouth daily.      BP 132/64  Pulse 121  Temp 99.3 F (37.4 C) (Oral)  Resp 24  SpO2 99%  Physical Exam  Nursing note and vitals reviewed. Constitutional: He is oriented to person, place, and time. He appears well-developed and well-nourished.  HENT:  Head: Normocephalic and atraumatic.  Eyes: EOM are normal. Pupils are equal, round, and reactive to light.  Neck: Normal range of motion. Neck supple.  Cardiovascular: Regular rhythm and normal heart sounds.     No murmur heard.      Tachycardia  Pulmonary/Chest: Effort normal and breath sounds normal.  Abdominal: Soft. Bowel sounds are normal. He exhibits no distension and no mass. There is no tenderness. There is no rebound and no guarding.  Genitourinary:       No CVA tenderness.  Musculoskeletal: Normal range of motion. He exhibits no edema.  Neurological: He is alert and oriented to person, place, and time. No cranial nerve deficit.  Skin: Skin is warm.  Psychiatric: He has a normal mood and affect.    ED Course  Procedures (including critical care time)  Labs Reviewed  URINALYSIS, ROUTINE W REFLEX MICROSCOPIC - Abnormal; Notable for the following:    APPearance CLOUDY (*)     Hgb urine dipstick SMALL (*)     Ketones, ur 15 (*)     Nitrite POSITIVE (*)     Leukocytes, UA LARGE (*)     All other components within normal limits  CBC WITH DIFFERENTIAL - Abnormal; Notable for the following:    RBC 3.66 (*)     Hemoglobin 11.9 (*)     HCT 35.5 (*)     RDW 15.9 (*)     Neutrophils Relative 82 (*)     Lymphocytes Relative 9 (*)     All other components within normal limits  COMPREHENSIVE METABOLIC PANEL - Abnormal; Notable for the following:    Chloride 93 (*)     Glucose, Bld 137 (*)     BUN 5 (*)     Albumin 3.4 (*)     Alkaline Phosphatase 231 (*)     All other components within normal limits  URINE MICROSCOPIC-ADD ON - Abnormal; Notable for the following:    Bacteria, UA MANY (*)     All other components within normal limits  PHENYTOIN LEVEL, TOTAL - Abnormal; Notable for the following:    Phenytoin Lvl 8.5 (*)     All other components within normal limits  LACTIC ACID, PLASMA - Abnormal; Notable for the following:    Lactic Acid, Venous 6.4 (*)     All other components within normal limits  PROCALCITONIN  URINE CULTURE  CULTURE, BLOOD (ROUTINE X 2)  CULTURE, BLOOD (ROUTINE X 2)  MAGNESIUM  PHOSPHORUS  COMPREHENSIVE METABOLIC PANEL  CBC  PROCALCITONIN  LACTIC ACID,  PLASMA   Dg Chest 2 View  03/05/2012  *RADIOLOGY REPORT*  Clinical Data: Cough.  Fever.  CHEST - 2 VIEW  Comparison: 02/13/2012.  Findings: Normal sized heart.  Clear lungs.  Thoracic spine degenerative changes, including changes of DISH.  IMPRESSION: No acute abnormality.   Original Report Authenticated By: Darrol Angel, M.D.      1. UTI (urinary tract infection)   2.  SIRS (systemic inflammatory response syndrome)   3. Anemia   4. DM (diabetes mellitus), type 2   5. HTN (hypertension)     Date: 03/06/2012  Rate: 118  Rhythm: sinus tachycardia  QRS Axis: normal  Intervals: normal  ST/T Wave abnormalities: normal  Conduction Disutrbances:none  Narrative Interpretation:   Old EKG Reviewed: unchanged a     MDM  Patient presents with fever. He was seen at an urgent care center sent him here: That had a urinary tract infection. He recently was admitted for sepsis with urinary tract infection. He has had some nauseousness or vomiting. Laboratory showed a UTI here. He'll be started on Zosyn and vancomycin due to the recent sepsis and hospital stay. He'll be admitted to medicine.        Juliet Rude. Rubin Payor, MD 03/06/12 0005

## 2012-03-05 NOTE — ED Notes (Addendum)
Pt reports that he went to [employer], went to there d/t having fever of 101.9; pt was told by [facility] that he has UTI and to come to ED for admission; pt denies dysuria; but does report dry cough; pt was recently seen in hospital for similar complaints and became septic; pt a&ox4

## 2012-03-06 ENCOUNTER — Encounter (HOSPITAL_COMMUNITY): Payer: Self-pay | Admitting: *Deleted

## 2012-03-06 ENCOUNTER — Inpatient Hospital Stay (HOSPITAL_COMMUNITY): Payer: Medicare Other

## 2012-03-06 LAB — GLUCOSE, CAPILLARY
Glucose-Capillary: 136 mg/dL — ABNORMAL HIGH (ref 70–99)
Glucose-Capillary: 141 mg/dL — ABNORMAL HIGH (ref 70–99)
Glucose-Capillary: 146 mg/dL — ABNORMAL HIGH (ref 70–99)
Glucose-Capillary: 91 mg/dL (ref 70–99)

## 2012-03-06 LAB — COMPREHENSIVE METABOLIC PANEL
ALT: 11 U/L (ref 0–53)
Albumin: 2.9 g/dL — ABNORMAL LOW (ref 3.5–5.2)
Alkaline Phosphatase: 197 U/L — ABNORMAL HIGH (ref 39–117)
BUN: 5 mg/dL — ABNORMAL LOW (ref 6–23)
Potassium: 3.9 mEq/L (ref 3.5–5.1)
Sodium: 136 mEq/L (ref 135–145)
Total Protein: 6.9 g/dL (ref 6.0–8.3)

## 2012-03-06 LAB — CBC
HCT: 38.4 % — ABNORMAL LOW (ref 39.0–52.0)
MCV: 98 fL (ref 78.0–100.0)
RDW: 16.3 % — ABNORMAL HIGH (ref 11.5–15.5)
WBC: 10.7 10*3/uL — ABNORMAL HIGH (ref 4.0–10.5)

## 2012-03-06 LAB — MAGNESIUM: Magnesium: 1.2 mg/dL — ABNORMAL LOW (ref 1.5–2.5)

## 2012-03-06 LAB — MRSA PCR SCREENING: MRSA by PCR: NEGATIVE

## 2012-03-06 MED ORDER — PHENYTOIN SODIUM EXTENDED 100 MG PO CAPS
100.0000 mg | ORAL_CAPSULE | ORAL | Status: DC
  Filled 2012-03-06: qty 1

## 2012-03-06 MED ORDER — PHENYTOIN SODIUM EXTENDED 100 MG PO CAPS
100.0000 mg | ORAL_CAPSULE | Freq: Every day | ORAL | Status: DC
  Administered 2012-03-07: 100 mg via ORAL
  Filled 2012-03-06: qty 1

## 2012-03-06 MED ORDER — CIPROFLOXACIN IN D5W 400 MG/200ML IV SOLN
400.0000 mg | Freq: Two times a day (BID) | INTRAVENOUS | Status: DC
  Administered 2012-03-06 – 2012-03-07 (×3): 400 mg via INTRAVENOUS
  Filled 2012-03-06 (×6): qty 200

## 2012-03-06 MED ORDER — VANCOMYCIN HCL 1000 MG IV SOLR
750.0000 mg | Freq: Two times a day (BID) | INTRAVENOUS | Status: DC
  Filled 2012-03-06: qty 750

## 2012-03-06 MED ORDER — PHENYTOIN 50 MG PO CHEW
50.0000 mg | CHEWABLE_TABLET | ORAL | Status: DC
  Filled 2012-03-06: qty 1

## 2012-03-06 MED ORDER — PHENYTOIN SODIUM EXTENDED 100 MG PO CAPS
200.0000 mg | ORAL_CAPSULE | ORAL | Status: DC
  Administered 2012-03-06: 200 mg via ORAL
  Filled 2012-03-06: qty 2

## 2012-03-06 MED ORDER — PIPERACILLIN-TAZOBACTAM 3.375 G IVPB
3.3750 g | Freq: Three times a day (TID) | INTRAVENOUS | Status: DC
  Filled 2012-03-06 (×2): qty 50

## 2012-03-06 MED ORDER — PHENYTOIN 50 MG PO CHEW
50.0000 mg | CHEWABLE_TABLET | Freq: Once | ORAL | Status: AC
  Administered 2012-03-06: 50 mg via ORAL
  Filled 2012-03-06: qty 1

## 2012-03-06 MED ORDER — TECHNETIUM TC 99M MEBROFENIN IV KIT
5.0000 | PACK | Freq: Once | INTRAVENOUS | Status: AC | PRN
  Administered 2012-03-06: 5 via INTRAVENOUS

## 2012-03-06 MED ORDER — IOHEXOL 300 MG/ML  SOLN: 20.0000 mL | INTRAMUSCULAR | Status: AC

## 2012-03-06 MED ORDER — CIPROFLOXACIN HCL 500 MG PO TABS: 500.0000 mg | ORAL_TABLET | Freq: Two times a day (BID) | ORAL | Status: AC

## 2012-03-06 NOTE — Evaluation (Addendum)
Physical Therapy One Time Evaluation Patient Details Name: Joseph Copeland MRN: [id number] DOB: 1940/08/02 Today's Date: 03/06/2012 Time: 9518-8416 PT Time Calculation (min): 21 min  PT Assessment / Plan / Recommendation Clinical Impression  PAtient admit with UTI with no problems with mobility at present.  Will not need PT in the hospital or at d/c.  No equipment needs either.  Will not follow.    PT Assessment  Patent does not need any further PT services    Follow Up Recommendations  No PT follow up    Barriers to Discharge        Equipment Recommendations  None recommended by PT    Recommendations for Other Services     Frequency      Precautions / Restrictions Precautions Precautions: Fall Restrictions Weight Bearing Restrictions: No   Pertinent Vitals/Pain VSS, No pain      Mobility  Bed Mobility Bed Mobility: Supine to Sit Supine to Sit: 7: Independent;HOB flat Sit to Supine: Not Tested (comment) Transfers Transfers: Sit to Stand;Stand to Sit Sit to Stand: 7: Independent;From bed Stand to Sit: 7: Independent;With armrests;With upper extremity assist;To chair/3-in-1 Details for Transfer Assistance: No assist or cues needed Ambulation/Gait Ambulation/Gait Assistance: 7: Independent Ambulation Distance (Feet): 300 Feet Assistive device: None Ambulation/Gait Assistance Details: Patient can accept challenges all directions.  No problems with balance.   Gait Pattern: Within Functional Limits Gait velocity: functional gait speed for community Stairs: No Wheelchair Mobility Wheelchair Mobility: No    PT Treatment Interventions: Gait training;Functional mobility training;Patient/family education   PT Goals  N/A  Visit Information  Last PT Received On: 03/06/12 Assistance Needed: +1    Subjective Data  Subjective: "I can do whatever." Patient Stated Goal: Get into a continuous walking exercise program.   Prior Functioning  Home Living Lives With:  Spouse Available Help at Discharge: Family Type of Home: House Home Access: Stairs to enter Secretary/administrator of Steps: 2 Entrance Stairs-Rails: None Home Layout: One level Bathroom Shower/Tub: Engineer, manufacturing systems: Standard Home Adaptive Equipment: Straight cane Prior Function Level of Independence: Independent with assistive device(s);Needs assistance Needs Assistance: Light Housekeeping;Meal Prep Meal Prep: Maximal Light Housekeeping: Maximal Able to Take Stairs?: Yes Driving: Yes Vocation: Part time employment Communication Communication: No difficulties Dominant Hand: Right    Cognition  Overall Cognitive Status: Appears within functional limits for tasks assessed/performed Area of Impairment: Memory;Problem solving Arousal/Alertness: Awake/alert Orientation Level: Appears intact for tasks assessed Behavior During Session: [facility] for tasks performed    Extremity/Trunk Assessment Right Upper Extremity Assessment RUE ROM/Strength/Tone: WFL for tasks assessed RUE Sensation: WFL - Light Touch RUE Coordination: WFL - gross/fine motor Left Upper Extremity Assessment LUE ROM/Strength/Tone: WFL for tasks assessed LUE Sensation: WFL - Light Touch LUE Coordination: WFL - gross/fine motor Right Lower Extremity Assessment RLE ROM/Strength/Tone: WFL for tasks assessed RLE Sensation: WFL - Light Touch RLE Coordination: WFL - gross/fine motor Left Lower Extremity Assessment LLE ROM/Strength/Tone: WFL for tasks assessed LLE Sensation: WFL - Light Touch LLE Coordination: WFL - gross/fine motor Trunk Assessment Trunk Assessment: Normal   Balance Static Sitting Balance Static Sitting - Level of Assistance: Not tested (comment) Static Standing Balance Static Standing - Balance Support: No upper extremity supported;During functional activity Static Standing - Level of Assistance: 7: Independent Standardized Balance Assessment Standardized Balance Assessment: Berg  Balance Test Berg Balance Test Sit to Stand: Able to stand without using hands and stabilize independently Standing Unsupported: Able to stand safely 2 minutes Sitting with Back Unsupported but Feet  Supported on Floor or Stool: Able to sit safely and securely 2 minutes Stand to Sit: Sits safely with minimal use of hands Transfers: Able to transfer safely, minor use of hands Standing Unsupported with Eyes Closed: Able to stand 10 seconds safely Standing Ubsupported with Feet Together: Able to place feet together independently and stand 1 minute safely From Standing, Reach Forward with Outstretched Arm: Can reach confidently >25 cm (10") From Standing Position, Pick up Object from Floor: Able to pick up shoe safely and easily From Standing Position, Turn to Look Behind Over each Shoulder: Looks behind from both sides and weight shifts well Turn 360 Degrees: Able to turn 360 degrees safely in 4 seconds or less Standing Unsupported, Alternately Place Feet on Step/Stool: Able to stand independently and safely and complete 8 steps in 20 seconds Standing Unsupported, One Foot in Front: Able to place foot tandem independently and hold 30 seconds Standing on One Leg: Able to lift leg independently and hold 5-10 seconds Total Score: 55  High Level Balance High Level Balance Comments: 55/56 on Berg.  Low risk of falls  End of Session PT - End of Session Equipment Utilized During Treatment: Gait belt Activity Tolerance: Patient tolerated treatment well Patient left: in chair;with call bell/phone within reach Nurse Communication: Mobility status       INGOLD,Drake Wuertz 03/06/2012, 11:22 AM  [facility] [phone] (pager)

## 2012-03-06 NOTE — Progress Notes (Signed)
CRITICAL VALUE ALERT  Critical value received: blood  cx ..gram neg rods  Date of notification:  03/06/12  Time of notification:  12:06   Critical value read back:yes  Nurse who received alert: Lora Paula, RN  MD notified (1st page):  Dr. Jerelyn Scott  Time of first page: 1215  MD notified (2nd page):  Time of second page:  Responding MD: Jerelyn Scott  Time MD responded:  [phone]

## 2012-03-06 NOTE — Progress Notes (Signed)
ANTIBIOTIC CONSULT NOTE - INITIAL  Pharmacy Consult for Vancocin/Zosyn Indication: rule out sepsis and UTI  No Known Allergies  Patient Measurements: Height: 5\' 6"  (167.6 cm) Weight: 139 lb 1.8 oz (63.1 kg) IBW/kg (Calculated) : 63.8   Vital Signs: Temp: 99.3 F (37.4 C) (09/12 2244) Temp src: Oral (09/12 2244) BP: 132/64 mmHg (09/12 2230) Pulse Rate: 121  (09/12 2230)  Labs:  Basename 03/05/12 2001  WBC 9.3  HGB 11.9*  PLT 331  LABCREA --  CREATININE 0.68   Estimated Creatinine Clearance: 76.7 ml/min (by C-G formula based on Cr of 0.68).  Microbiology: Recent Results (from the past 720 hour(s))  URINE CULTURE     Status: Normal   Collection Time   02/09/12  8:52 AM      Component Value Range Status Comment   Specimen Description URINE, CLEAN CATCH   Final    Special Requests ADDED ON 02/09/12 AT 1050   Final    Culture  Setup Time 02/09/2012 16:54   Final    Colony Count >=100,000 COLONIES/ML   Final    Culture ESCHERICHIA COLI   Final    Report Status 02/11/2012 FINAL   Final    Organism ID, Bacteria ESCHERICHIA COLI   Final   CULTURE, BLOOD (ROUTINE X 2)     Status: Normal   Collection Time   02/09/12 10:41 AM      Component Value Range Status Comment   Specimen Description BLOOD LEFT ARM   Final    Special Requests BOTTLES DRAWN AEROBIC AND ANAEROBIC 10CC   Final    Culture  Setup Time 02/09/2012 16:55   Final    Culture NO GROWTH 5 DAYS   Final    Report Status 02/15/2012 FINAL   Final   CULTURE, BLOOD (ROUTINE X 2)     Status: Normal   Collection Time   02/09/12 10:46 AM      Component Value Range Status Comment   Specimen Description BLOOD LEFT HAND   Final    Special Requests BOTTLES DRAWN AEROBIC AND ANAEROBIC 10CC   Final    Culture  Setup Time 02/09/2012 16:55   Final    Culture NO GROWTH 5 DAYS   Final    Report Status 02/15/2012 FINAL   Final   MRSA PCR SCREENING     Status: Normal   Collection Time   02/09/12 12:49 PM      Component Value  Range Status Comment   MRSA by PCR NEGATIVE  NEGATIVE Final   CLOSTRIDIUM DIFFICILE BY PCR     Status: Abnormal   Collection Time   02/09/12  3:18 PM      Component Value Range Status Comment   C difficile by pcr POSITIVE (*) NEGATIVE Final     Medical History: Past Medical History  Diagnosis Date  . Hypertension   . Arthritis   . Seizures     first12/12-none since  . Diabetes mellitus     type 2-no meds  . Sleep apnea     nasal CPAP at [facility]    Assessment: 71yo male was recently tx'd for UTI, had been admitted and discharged, now septic, to begin broad-spectrum ABX with plan to narrow as Cx become available.  Goal of Therapy:  Vancomycin trough level 15-20 mcg/ml  Plan:  Rec'd vanc 1g and Zosyn 3.375g IV in ED; will continue with vancomycin 750mg  IV Q12H and Zosyn 3.375g IV Q8H and monitor CBC, Cx, levels prn.  Vernard Gambles  Eulah Citizen PharmD BCPS 03/06/2012,12:03 AM

## 2012-03-06 NOTE — Progress Notes (Signed)
Notified Dr. Kevan Ny regarding blood cx results of gram neg rods

## 2012-03-06 NOTE — Research (Signed)
To xray via w/c.

## 2012-03-06 NOTE — ED Notes (Signed)
Dr Elisabeth Pigeon paged RE duplicate labs. Pharmacy paged for dilantin.

## 2012-03-06 NOTE — ED Notes (Signed)
Pt to ED c/o nausea and fevers x 1 day.  Pt was sent here by [facility] b/c of hx of being tx in ICU for sepsis.  Pt denies any pain or urinary s/s at this time.  Tachycardic in the 130's. AO x 4.

## 2012-03-06 NOTE — Progress Notes (Signed)
Subjective: Mr. Joseph Copeland presents with febrile illness yesterday and found to have recurrent UTI.  Recently placed on Enablex in addition to Flomax for BPH.  Followed by Dr. Su Grand.  Had recent Escherichia coli sepsis.  Treated with standard course of antibiotics for that and had not noticed any dysuria or frequency prior to developing his febrile illness yesterday.  Was seen in the office several days ago feeling quite well.  Has also been seen in followup by Dr. Brunilda Payor since his hospitalization when Enablex was added.  Objective: Weight change:   Intake/Output Summary (Last 24 hours) at 03/06/12 0756 Last data filed at 03/06/12 0600  Gross per 24 hour  Intake    500 ml  Output    100 ml  Net    400 ml   Filed Vitals:   03/06/12 0300 03/06/12 0400 03/06/12 0500 03/06/12 0600  BP: 119/51 137/93 125/74 134/66  Pulse: 104 112 119 109  Temp:  100.3 F (37.9 C)    TempSrc:  Oral    Resp: 20 21 34 36  Height:      Weight:      SpO2: 98% 99% 93% 94%   General Appearance: Alert, cooperative, no distress, appears stated age, appears fatigued Head: Normocephalic, without obvious abnormality, atraumatic Neck: Supple, symmetrical Lungs: Clear to auscultation bilaterally, respirations unlabored Heart: Regular rate and rhythm, S1 and S2 normal, no murmur, rub or gallop Abdomen: Soft, non-tender, bowel sounds active all four quadrants, no masses, no organomegaly Extremities: Extremities normal, atraumatic, no cyanosis or edema Pulses: 2+ and symmetric all extremities Skin: Skin color, texture, turgor normal, no rashes or lesions Neuro: CNII-XII intact. Normal strength, sensation and reflexes throughout   Lab Results:  Basename 03/06/12 0425 03/05/12 2001  NA 136 135  K 3.9 3.8  CL 101 93*  CO2 19 26  GLUCOSE 123* 137*  BUN 5* 5*  CREATININE 0.70 0.68  CALCIUM 8.5 9.5  MG -- 1.2*  PHOS -- 4.4    Basename 03/06/12 0425 03/05/12 2001  AST 23 20  ALT 11 13  ALKPHOS 197* 231*    BILITOT 0.9 0.6  PROT 6.9 7.7  ALBUMIN 2.9* 3.4*   No results found for this basename: LIPASE:2,AMYLASE:2 in the last 72 hours  Basename 03/06/12 0425 03/05/12 2001  WBC 10.7* 9.3  NEUTROABS -- 7.7  HGB 12.8* 11.9*  HCT 38.4* 35.5*  MCV 98.0 97.0  PLT 220 331   No results found for this basename: CKTOTAL:3,CKMB:3,CKMBINDEX:3,TROPONINI:3 in the last 72 hours No components found with this basename: POCBNP:3 No results found for this basename: DDIMER:2 in the last 72 hours No results found for this basename: HGBA1C:2 in the last 72 hours No results found for this basename: CHOL:2,HDL:2,LDLCALC:2,TRIG:2,CHOLHDL:2,LDLDIRECT:2 in the last 72 hours No results found for this basename: TSH,T4TOTAL,FREET3,T3FREE,THYROIDAB in the last 72 hours No results found for this basename: VITAMINB12:2,FOLATE:2,FERRITIN:2,TIBC:2,IRON:2,RETICCTPCT:2 in the last 72 hours  Studies/Results: Dg Chest 2 View  03/05/2012  *RADIOLOGY REPORT*  Clinical Data: Cough.  Fever.  CHEST - 2 VIEW  Comparison: 02/13/2012.  Findings: Normal sized heart.  Clear lungs.  Thoracic spine degenerative changes, including changes of DISH.  IMPRESSION: No acute abnormality.   Original Report Authenticated By: Darrol Angel, M.D.    Medications: Scheduled Meds:   . insulin aspart  0-15 Units Subcutaneous TID WC  . insulin aspart  0-5 Units Subcutaneous QHS  . metoprolol tartrate  25 mg Oral Daily  . multivitamin with minerals  1 tablet Oral  Daily  . phenytoin  50 mg Oral BID  . piperacillin-tazobactam (ZOSYN)  IV  3.375 g Intravenous Once  . piperacillin-tazobactam (ZOSYN)  IV  3.375 g Intravenous Q8H  . sodium chloride  1,000 mL Intravenous Once  . sodium chloride  1,000 mL Intravenous Once  . sodium chloride  500 mL Intravenous STAT  . sodium chloride  3 mL Intravenous Q12H  . Tamsulosin HCl  0.4 mg Oral Daily  . testosterone  5 g Transdermal Daily  . vancomycin  750 mg Intravenous Q12H  . vancomycin  1,000 mg  Intravenous Once  . vitamin B-12  100 mcg Oral Daily  . vitamin E  100 Units Oral Daily  . DISCONTD: metFORMIN  750 mg Oral Q breakfast   Continuous Infusions:   . sodium chloride 100 mL/hr (03/06/12 0000)   PRN Meds:.acetaminophen, acetaminophen, hydrALAZINE, ondansetron (ZOFRAN) IV, ondansetron  Assessment/Plan: Patient Active Problem List   Diagnosis Date Noted  . UTI (lower urinary tract infection) - change to IV Cipro.  Last Escherichia coli urinary tract infection was pansensitive.   02/09/2012  . Seizures, generalized convulsive - continue Dilantin  06/03/2011  . HTN (hypertension) - continue to monitor  06/03/2011  . DM (diabetes mellitus), type 2 - sliding scale NovoLog insulin  06/03/2011  . Anemia - mild  Abnormal abdominal ultrasound suggesting possible acalculous cholecystitis during last admission  - check biliary scan N.p.o. until after biliary scan  Continue IV fluids for now  06/03/2011     LOS: 1 day   Joseph Copeland 03/06/2012, 7:56 AM

## 2012-03-06 NOTE — Discharge Summary (Signed)
Physician Discharge Summary  NAME:Joseph Copeland  WUJ:[id number]  DOB: [birth year]   Admit date: 03/05/2012 Discharge date: 03/06/2012  Discharge Diagnoses:  Principal Problem:  UTI (lower urinary tract infection) - recurrent with gram-negative bacteremia Active Problems:  Seizures, generalized convulsive - stable  HTN (hypertension) - stable  DM (diabetes mellitus), type 2 - stable  Anemia - stable   Discharge Condition: improved  Hospital Course: Joseph Copeland is a pleasant 71 year old male who was just hospitalized a couple of weeks ago for gram-negative bacteremia complicated by nausea vomiting aspiration and respiratory arrest and cardiac arrest. One issue simply lead to another but pulmonary and cardiac issues were felt to be benign. He has a history of a seizure disorder developed within the past year it has been under good control. He also has diabetes controlled with oral therapy. On March 05, 2012, he developed fever and chills. Temperature as high as 101.2. Brought to the [facility] emergency room and found to have pyuria bacteriuria and was felt to have a recurrent urinary tract infection. He had been treated with a ten-day course of antibiotics during the last hospitalization and has seen Dr. Ezzie Dural since. Urinalysis was normal a [facility] within the past one to 2 weeks. On review of imaging studies from last hospitalization he was found to have a slightly thickened gallbladder but HIDA scanning was normal today for biliary function. He is being discharged on oral Cipro and will need to followup on blood cultures that are growing gram-negative rods, most likely the Escherichia coli that was pansensitive during his last hospitalization.  Speciation of bacteria should be present prior to discharge  Consults: None  Disposition: 01-Home or Self Care  Discharge Orders    Future Orders Please Complete By Expires   Diet - low sodium heart healthy      Increase activity  slowly      Discharge instructions      Comments:   Call [phone] for appointment in 2 weeks. Dr. Brunilda Payor will contact you on Monday for followup       Medication List     As of 03/06/2012  5:19 PM    TAKE these medications         ciprofloxacin 500 MG tablet   Commonly known as: CIPRO   Take 1 tablet (500 mg total) by mouth 2 (two) times daily.   Start taking on: 03/07/2012      metFORMIN 750 MG 24 hr tablet   Commonly known as: GLUCOPHAGE-XR   Take 750 mg by mouth daily with breakfast.      metoprolol tartrate 25 MG tablet   Commonly known as: LOPRESSOR   Take 25 mg by mouth daily.      Milk Thistle 1000 MG Caps   Take 1 tablet by mouth daily.      multivitamin with minerals Tabs   Take 1 tablet by mouth daily.      phenytoin 100 MG ER capsule   Commonly known as: DILANTIN   Take 100-200 mg by mouth 2 (two) times daily. Take 100mg  every morning.  Take 200mg  at bedtime on Mondays, Wednesdays, and Fridays.  Take 100mg  capsule plus 50mg  tablet at bedtime on Tuesdays, Thursdays, Saturdays, and Sundays.      phenytoin 50 MG tablet   Commonly known as: DILANTIN   Chew 50 mg by mouth See admin instructions. Take 50mg  tablet with 100mg  capsule at bedtime on Tuesdays, Thursdays, Saturdays, and Sundays.      saw palmetto  160 MG capsule   Take 160 mg by mouth 2 (two) times daily.      Tamsulosin HCl 0.4 MG Caps   Commonly known as: FLOMAX   Take 0.4 mg by mouth daily.      testosterone 50 MG/5GM Gel   Commonly known as: ANDROGEL   Place 5 g onto the skin daily.      VITAMIN B 12 PO   Take 1 tablet by mouth daily.      vitamin E 100 UNIT capsule   Take 100 Units by mouth daily.         Things to follow up in the outpatient setting:  Recurrence of fever or chills  Time coordinating discharge: 35 minutes  The results of significant diagnostics from this hospitalization (including imaging, microbiology, ancillary and laboratory) are listed below for reference.     Significant Diagnostic Studies: Dg Chest 2 View  03/05/2012  *RADIOLOGY REPORT*  Clinical Data: Cough.  Fever.  CHEST - 2 VIEW  Comparison: 02/13/2012.  Findings: Normal sized heart.  Clear lungs.  Thoracic spine degenerative changes, including changes of DISH.  IMPRESSION: No acute abnormality.   Original Report Authenticated By: Darrol Angel, M.D.    Dg Chest 2 View  02/09/2012  *RADIOLOGY REPORT*  Clinical Data: Fever.  Cough.  Seizures.  CHEST - 2 VIEW  Comparison: 06/03/2011  Findings: The heart size and pulmonary vascularity are normal. The lungs appear clear and expanded without focal air space disease or consolidation. No blunting of the costophrenic angles.  No pneumothorax.  Mediastinal contours appear intact.  Calcification of the aorta.  Degenerative changes in the spine.  No significant change since previous study.  IMPRESSION: No evidence of active pulmonary disease.  Original Report Authenticated By: Marlon Pel, M.D.   Nm Hepatobiliary Liver Func  03/06/2012  *RADIOLOGY REPORT*  Clinical Data:  Fever, abnormal abdominal ultrasound with gallbladder wall thickening worrisome for possible acalculous cholecystitis.  NUCLEAR MEDICINE HEPATOBILIARY IMAGING  Technique:  Sequential images of the abdomen were obtained out to 60 minutes following intravenous administration of radiopharmaceutical.  Radiopharmaceutical:  Tc-18m Choletec  Comparison:  Abdominal ultrasound - 02/10/2012  Findings:  There is homogeneous distribution of radiotracer throughout the hepatic parenchyma.  There is early opacification of the gallbladder, initially seen on the provided 15-minute anterior projection planar image.  There is eventual opacification of the small bowel, seen on the 55- minute planar image.  IMPRESSION: No scintigraphic evidence of acute cholecystitis.   Original Report Authenticated By: Waynard Reeds, M.D.    US Abdomen Complete  02/10/2012  *RADIOLOGY REPORT*  Clinical Data:   Abdominal distention, hypertension, diabetes, question cirrhosis, ascites  ULTRASOUND ABDOMEN:  Technique:  Sonography of upper abdominal structures was performed. Imaging degraded by labored breathing.  Comparison:  06/04/2011  Gallbladder:  Gallbladder wall thickening up to 10 mm thick with hypoechoic striations consistent with edema.  No definite calculi or sonographic Seaman's sign.  Common bile duct:  Upper normal caliber 6 mm diameter.  Liver:  Echogenic, question fatty infiltration though this can be seen with cirrhosis and certain infiltrative disorders.  No definite hepatic nodularity for irregular contours identified. Several small hypoechoic foci are seen question small cysts, largest 18 mm diameter. Additional of a 16 mm diameter hypoechoic focus centrally within right lobe, cannot exclude small solid nodule.  No intrahepatic biliary dilatation.  IVC:  Normal appearance  Pancreas:  Body and proximal tail normal appearance, remainder obscured by bowel gas.  Spleen:  Normal appearance, 6.3 cm length.  Right kidney:  10.5 cm length. Normal morphology without mass or hydronephrosis.  Left kidney:  11.0 cm length.  Normal cortical thickness without definite solid mass or hydronephrosis.  Hypoechoic nodule centrally left kidney, 1.2 x 1.7 x 1.4 cm, corresponding to a cyst seen on the previous exam, grossly stable in size but less well visualized on current study.  Aorta:  Limited visualization due to bowel gas.  Other:  No ascites.  IMPRESSION: Echogenic liver which could represent fatty infiltration or cirrhosis. Small hepatic cysts with an additional 1.6 cm diameter nonspecific hypoechoic focus centrally within the liver. A solid mass is not excluded; follow-up MR imaging with without contrast recommended to evaluate. Probable small left renal cyst 1.7 cm diameter. Incomplete visualization of pancreas and aorta. Nonspecific gallbladder wall thickening, though no definite calculi are visualized; acalculous  cholecystitis not excluded.   Original Report Authenticated By: Lollie Marrow, M.D. ( 02/10/2012 09:57:15 )    Nm Myocar Multi W/spect W/wall Motion / Ef  02/17/2012  *RADIOLOGY REPORT*  Clinical Data:  Cardiac arrest with mildly elevated CPK.  Diabetes and hypertension.  MYOCARDIAL IMAGING WITH SPECT (REST AND PHARMACOLOGIC-STRESS) GATED LEFT VENTRICULAR WALL MOTION STUDY LEFT VENTRICULAR EJECTION FRACTION  Technique:  Standard myocardial SPECT imaging was performed after resting intravenous injection of 10 mCi Tc-62m tetrofosmin. Subsequently, intravenous infusion of lexiscan was performed under the supervision of the Cardiology staff.  At peak effect of the drug, 30 mCi Tc-52m tetrofosmin was injected intravenously and standard myocardial SPECT  imaging was performed.  Quantitative gated imaging was also performed to evaluate left ventricular wall motion, and estimate left ventricular ejection fraction.  Comparison:  Chest x-ray 02/13/2012.  Findings: Normal perfusion is evident during rest and stress imaging.  There is normal contractility and wall motion.  The estimated diastolic volume is 38.  The estimated systolic volume is 8.  The calculated ejection fraction is 78%.  IMPRESSION:  1.  Normal cardiac perfusion without evidence for reversible ischemia. 2.  Normal contractility wall motion. 3.  Normal cardiac function with an estimated ejection fraction of 78%.   Original Report Authenticated By: Jamesetta Orleans. MATTERN, M.D.    Dg Chest Port 1 View  02/13/2012  *RADIOLOGY REPORT*  Clinical Data: Evaluate ET tube placement  PORTABLE CHEST - 1 VIEW  Comparison: 02/12/2012  Findings: There is been interval extubation and removal of the nasogastric tube. The left IJ catheter tip remains and is in the projection of the innominate vein.  No pleural effusion or edema.  No airspace consolidation.  Review of the visualized osseous structures is unremarkable.  IMPRESSION:  1.  Status post extubation.   Original  Report Authenticated By: Rosealee Albee, M.D.    Dg Chest Port 1 View  02/12/2012  *RADIOLOGY REPORT*  Clinical Data: Ventilator dependent respiratory failure.  PORTABLE CHEST - 1 VIEW  Comparison: 02/11/2012  Findings: Endotracheal tube has been pulled back with tip now 2 cm above carina.  Nasogastric tube and left jugular center venous catheter remain in appropriate position.  Low lung volumes and mild bibasilar atelectasis show no significant change.  No evidence of pulmonary consolidation or edema.  Heart size is normal.  IMPRESSION:  1.  Low lung volumes and mild bibasilar atelectasis, without significant change. 2.  Endotracheal tube in appropriate position.   Original Report Authenticated By: Danae Orleans, M.D.    Dg Chest Port 1 View  02/11/2012  *RADIOLOGY REPORT*  Clinical  Data: 71 year old male endotracheal tube advancement.  PORTABLE CHEST - 1 VIEW  Comparison: 1202 hours the same day earlier.  Findings: AP portable semi upright view at 1403 hours. Endotracheal tube tip now above the carina.  This should be retracted for more optimal place.  Stable left IJ central line.  Enteric tube now in place, looped in the gastric body region.  Stable lung volumes.  Mildly improved bibasilar ventilation.  No confluent opacity, pneumothorax, edema, or definite effusion. Stable cardiac size and mediastinal contours.  IMPRESSION:  1.  Endotracheal tube tip now above the carina.  Withdraw 2 cm for optimal placement. 2.  Enteric tube placed, looped at the gastric body. 3. No acute cardiopulmonary abnormality.   Original Report Authenticated By: Harley Hallmark, M.D.    Dg Chest Port 1 View  02/11/2012  *RADIOLOGY REPORT*  Clinical Data: Intubation.  PORTABLE CHEST - 1 VIEW  Comparison: 02/09/2012  Findings: An endotracheal tube is in place, with tip 2.9 cm above the carina.  A left internal jugular line has been placed.  Its tip is oriented transversely and projects over the SVC.  Low lung volumes noted with  mild cardiomegaly and thoracic spondylosis.  Linear subsegmental atelectasis noted at the right lung base.  IMPRESSION:  1.  ET tube tip 2.9 cm above the carina. 2.  Left IJ line tip projects over the SVC.  No pneumothorax observed. 3.  Low lung volumes. 4.  Mild atelectasis at the right lung base.   Original Report Authenticated By: Dellia Cloud, M.D.    Dg Chest Portable 1 View  02/09/2012  *RADIOLOGY REPORT*  Clinical Data: Sepsis.  Central line placement.  PORTABLE CHEST - 1 VIEW  Comparison: 6:47 hours  Findings: Left internal jugular vein center venous catheter placed. Tip is in the upper SVC.  No pneumothorax.  Normal heart size. Clear lungs.  IMPRESSION: Left internal jugular vein central venous catheter placement with its tip in the upper SVC and no pneumothorax.  Original Report Authenticated By: Donavan Burnet, M.D.   Dg Abd 2 Views  02/09/2012  *RADIOLOGY REPORT*  Clinical Data: Abdominal distention for 4 months.  Diarrhea for 2 months.  Fever for 1 week.  Cough and congestion.  ABDOMEN - 2 VIEW  Comparison: Chest film earlier today.  Abdominal pelvic CT of 01/05/2009  Findings: Right-sided decubitus view demonstrates no free intraperitoneal air or significant air fluid levels.  Supine view demonstrates gas within normal caliber large and small bowel loops.  Distal gas.  No pneumatosis.  Phleboliths in the pelvis. No abnormal abdominal calcifications.   No appendicolith.  IMPRESSION: No acute findings.  Original Report Authenticated By: Consuello Bossier, M.D.   Ct Portable Head W/o Cm  02/11/2012  *RADIOLOGY REPORT*  Clinical Data: Cardiac arrest  CT HEAD WITHOUT CONTRAST  Technique:  Contiguous axial images were obtained from the base of the skull through the vertex without contrast.  Comparison: MRI 06/04/2011  Findings: Portable CT equipment was utilized.  Generalized atrophy.  Ventricle size is normal.  Negative for hemorrhage.  No acute infarct or mass.  IMPRESSION: No acute  abnormality.   Original Report Authenticated By: Camelia Phenes, M.D.     Microbiology: Recent Results (from the past 240 hour(s))  CULTURE, BLOOD (ROUTINE X 2)     Status: Normal (Preliminary result)   Collection Time   03/05/12  8:02 PM      Component Value Range Status Comment   Specimen Description BLOOD ARM  RIGHT   Final    Special Requests BOTTLES DRAWN AEROBIC AND ANAEROBIC B 10CC R 5 8CC   Final    Culture  Setup Time 03/06/2012 01:55   Final    Culture     Final    Value: GRAM NEGATIVE RODS     Note: Gram Stain Report Called to,Read Back By and Verified With: CAROLYN W @ 1204 03/06/12 BY KRAWS   Report Status PENDING   Incomplete   CULTURE, BLOOD (ROUTINE X 2)     Status: Normal (Preliminary result)   Collection Time   03/05/12  8:16 PM      Component Value Range Status Comment   Specimen Description BLOOD HAND RIGHT   Final    Special Requests BOTTLES DRAWN AEROBIC AND ANAEROBIC 10CC   Final    Culture  Setup Time 03/06/2012 01:56   Final    Culture     Final    Value: GRAM NEGATIVE RODS     Note: Gram Stain Report Called to,Read Back By and Verified With: CAROLYN W @ 1204 03/06/12 BY KRAWS   Report Status PENDING   Incomplete   MRSA PCR SCREENING     Status: Normal   Collection Time   03/06/12  2:09 AM      Component Value Range Status Comment   MRSA by PCR NEGATIVE  NEGATIVE Final      Labs: Results for orders placed during the hospital encounter of 03/05/12  URINALYSIS, ROUTINE W REFLEX MICROSCOPIC      Component Value Range   Color, Urine YELLOW  YELLOW   APPearance CLOUDY (*) CLEAR   Specific Gravity, Urine 1.006  1.005 - 1.030   pH 6.5  5.0 - 8.0   Glucose, UA NEGATIVE  NEGATIVE mg/dL   Hgb urine dipstick SMALL (*) NEGATIVE   Bilirubin Urine NEGATIVE  NEGATIVE   Ketones, ur 15 (*) NEGATIVE mg/dL   Protein, ur NEGATIVE  NEGATIVE mg/dL   Urobilinogen, UA 0.2  0.0 - 1.0 mg/dL   Nitrite POSITIVE (*) NEGATIVE   Leukocytes, UA LARGE (*) NEGATIVE  CBC WITH  DIFFERENTIAL      Component Value Range   WBC 9.3  4.0 - 10.5 K/uL   RBC 3.66 (*) 4.22 - 5.81 MIL/uL   Hemoglobin 11.9 (*) 13.0 - 17.0 g/dL   HCT 78.2 (*) 95.6 - 21.3 %   MCV 97.0  78.0 - 100.0 fL   MCH 32.5  26.0 - 34.0 pg   MCHC 33.5  30.0 - 36.0 g/dL   RDW 08.6 (*) 57.8 - 46.9 %   Platelets 331  150 - 400 K/uL   Neutrophils Relative 82 (*) 43 - 77 %   Neutro Abs 7.7  1.7 - 7.7 K/uL   Lymphocytes Relative 9 (*) 12 - 46 %   Lymphs Abs 0.8  0.7 - 4.0 K/uL   Monocytes Relative 8  3 - 12 %   Monocytes Absolute 0.8  0.1 - 1.0 K/uL   Eosinophils Relative 0  0 - 5 %   Eosinophils Absolute 0.0  0.0 - 0.7 K/uL   Basophils Relative 0  0 - 1 %   Basophils Absolute 0.0  0.0 - 0.1 K/uL  COMPREHENSIVE METABOLIC PANEL      Component Value Range   Sodium 135  135 - 145 mEq/L   Potassium 3.8  3.5 - 5.1 mEq/L   Chloride 93 (*) 96 - 112 mEq/L   CO2 26  19 -  32 mEq/L   Glucose, Bld 137 (*) 70 - 99 mg/dL   BUN 5 (*) 6 - 23 mg/dL   Creatinine, Ser 0.45  0.50 - 1.35 mg/dL   Calcium 9.5  8.4 - 40.9 mg/dL   Total Protein 7.7  6.0 - 8.3 g/dL   Albumin 3.4 (*) 3.5 - 5.2 g/dL   AST 20  0 - 37 U/L   ALT 13  0 - 53 U/L   Alkaline Phosphatase 231 (*) 39 - 117 U/L   Total Bilirubin 0.6  0.3 - 1.2 mg/dL   GFR calc non Af Amer >90  >90 mL/min   GFR calc Af Amer >90  >90 mL/min  CULTURE, BLOOD (ROUTINE X 2)      Component Value Range   Specimen Description BLOOD ARM RIGHT     Special Requests BOTTLES DRAWN AEROBIC AND ANAEROBIC B 10CC R 5 8CC     Culture  Setup Time 03/06/2012 01:55     Culture       Value: GRAM NEGATIVE RODS     Note: Gram Stain Report Called to,Read Back By and Verified With: CAROLYN W @ 1204 03/06/12 BY KRAWS   Report Status PENDING    CULTURE, BLOOD (ROUTINE X 2)      Component Value Range   Specimen Description BLOOD HAND RIGHT     Special Requests BOTTLES DRAWN AEROBIC AND ANAEROBIC 10CC     Culture  Setup Time 03/06/2012 01:56     Culture       Value: GRAM NEGATIVE RODS      Note: Gram Stain Report Called to,Read Back By and Verified With: CAROLYN W @ 1204 03/06/12 BY KRAWS   Report Status PENDING    URINE MICROSCOPIC-ADD ON      Component Value Range   WBC, UA TOO NUMEROUS TO COUNT  <3 WBC/hpf   RBC / HPF 0-2  <3 RBC/hpf   Bacteria, UA MANY (*) RARE  PHENYTOIN LEVEL, TOTAL      Component Value Range   Phenytoin Lvl 8.5 (*) 10.0 - 20.0 ug/mL  LACTIC ACID, PLASMA      Component Value Range   Lactic Acid, Venous 6.4 (*) 0.5 - 2.2 mmol/L  PROCALCITONIN      Component Value Range   Procalcitonin 0.90    MAGNESIUM      Component Value Range   Magnesium 1.2 (*) 1.5 - 2.5 mg/dL  PHOSPHORUS      Component Value Range   Phosphorus 4.4  2.3 - 4.6 mg/dL  COMPREHENSIVE METABOLIC PANEL      Component Value Range   Sodium 136  135 - 145 mEq/L   Potassium 3.9  3.5 - 5.1 mEq/L   Chloride 101  96 - 112 mEq/L   CO2 19  19 - 32 mEq/L   Glucose, Bld 123 (*) 70 - 99 mg/dL   BUN 5 (*) 6 - 23 mg/dL   Creatinine, Ser 8.11  0.50 - 1.35 mg/dL   Calcium 8.5  8.4 - 91.4 mg/dL   Total Protein 6.9  6.0 - 8.3 g/dL   Albumin 2.9 (*) 3.5 - 5.2 g/dL   AST 23  0 - 37 U/L   ALT 11  0 - 53 U/L   Alkaline Phosphatase 197 (*) 39 - 117 U/L   Total Bilirubin 0.9  0.3 - 1.2 mg/dL   GFR calc non Af Amer >90  >90 mL/min   GFR calc Af Amer >90  >90 mL/min  CBC      Component Value Range   WBC 10.7 (*) 4.0 - 10.5 K/uL   RBC 3.92 (*) 4.22 - 5.81 MIL/uL   Hemoglobin 12.8 (*) 13.0 - 17.0 g/dL   HCT 40.9 (*) 81.1 - 91.4 %   MCV 98.0  78.0 - 100.0 fL   MCH 32.7  26.0 - 34.0 pg   MCHC 33.3  30.0 - 36.0 g/dL   RDW 78.2 (*) 95.6 - 21.3 %   Platelets 220  150 - 400 K/uL  PROCALCITONIN      Component Value Range   Procalcitonin 2.80    LACTIC ACID, PLASMA      Component Value Range   Lactic Acid, Venous 1.6  0.5 - 2.2 mmol/L  GLUCOSE, CAPILLARY      Component Value Range   Glucose-Capillary 146 (*) 70 - 99 mg/dL   Comment 1 Notify RN     Comment 2 Documented in Chart    MRSA PCR  SCREENING      Component Value Range   MRSA by PCR NEGATIVE  NEGATIVE  GLUCOSE, CAPILLARY      Component Value Range   Glucose-Capillary 141 (*) 70 - 99 mg/dL  GLUCOSE, CAPILLARY      Component Value Range   Glucose-Capillary 130 (*) 70 - 99 mg/dL  GLUCOSE, CAPILLARY      Component Value Range   Glucose-Capillary 91  70 - 99 mg/dL     Signed: Kadelyn Dimascio NEVILL 03/06/2012, 5:19 PM

## 2012-03-07 ENCOUNTER — Other Ambulatory Visit (HOSPITAL_COMMUNITY): Payer: Medicare Other

## 2012-03-07 ENCOUNTER — Inpatient Hospital Stay (HOSPITAL_COMMUNITY): Payer: Medicare Other

## 2012-03-07 LAB — GLUCOSE, CAPILLARY
Glucose-Capillary: 120 mg/dL — ABNORMAL HIGH (ref 70–99)
Glucose-Capillary: 154 mg/dL — ABNORMAL HIGH (ref 70–99)

## 2012-03-07 LAB — URINE CULTURE

## 2012-03-07 MED ORDER — IOHEXOL 300 MG/ML  SOLN
80.0000 mL | Freq: Once | INTRAMUSCULAR | Status: AC | PRN
  Administered 2012-03-07: 80 mL via INTRAVENOUS

## 2012-03-07 MED ORDER — PNEUMOCOCCAL VAC POLYVALENT 25 MCG/0.5ML IJ INJ
0.5000 mL | INJECTION | INTRAMUSCULAR | Status: DC
  Filled 2012-03-07: qty 0.5

## 2012-03-07 MED ORDER — IOHEXOL 300 MG/ML  SOLN
20.0000 mL | INTRAMUSCULAR | Status: AC
  Administered 2012-03-07 (×2): 20 mL via ORAL

## 2012-03-07 MED ORDER — PNEUMOCOCCAL VAC POLYVALENT 25 MCG/0.5ML IJ INJ
0.5000 mL | INJECTION | Freq: Once | INTRAMUSCULAR | Status: AC
  Administered 2012-03-07: 0.5 mL via INTRAMUSCULAR
  Filled 2012-03-07: qty 0.5

## 2012-03-07 MED ORDER — INFLUENZA VIRUS VACC SPLIT PF IM SUSP
0.5000 mL | Freq: Once | INTRAMUSCULAR | Status: AC
  Administered 2012-03-07: 0.5 mL via INTRAMUSCULAR
  Filled 2012-03-07: qty 0.5

## 2012-03-07 MED ORDER — PNEUMOCOCCAL VAC POLYVALENT 25 MCG/0.5ML IJ INJ: 0.5000 mL | INJECTION | INTRAMUSCULAR | Status: DC

## 2012-03-07 NOTE — Progress Notes (Signed)
Subjective: Patient doing well, no problems overnight. No nausea no vomiting no dysuria. Unfortunately patient did not have his CAT scan of his abdomen and pelvis. Urine culture does show E. coli, blood culture gram-negative rod. Patient remains afebrile, on IV Cipro.  Objective: Vital signs in last 24 hours: Temp:  [98 F (36.7 C)-99.9 F (37.7 C)] 98.4 F (36.9 C) (09/14 0400) Pulse Rate:  [92-103] 95  (09/14 0400) Resp:  [12-28] 24  (09/14 0400) BP: (103-129)/(60-85) 118/62 mmHg (09/14 0400) SpO2:  [95 %-99 %] 99 % (09/14 0400) Weight:  [59.8 kg (131 lb 13.4 oz)] 59.8 kg (131 lb 13.4 oz) (09/14 0026) Weight change: -3.3 kg (-7 lb 4.4 oz) Last BM Date: 03/06/12  Intake/Output from previous day: 09/13 0701 - 09/14 0700 In: 1120 [P.O.:720; I.V.:400] Out: 2451 [Urine:2450; Stool:1] Intake/Output this shift:    General appearance: alert and cooperative Resp: clear to auscultation bilaterally Cardio: regular rate and rhythm, S1, S2 normal, no murmur, click, rub or gallop GI: soft, non-tender; bowel sounds normal; no masses,  no organomegaly Extremities: extremities normal, atraumatic, no cyanosis or edema  Lab Results:  Results for orders placed during the hospital encounter of 03/05/12 (from the past 24 hour(s))  GLUCOSE, CAPILLARY     Status: Abnormal   Collection Time   03/06/12  8:11 AM      Component Value Range   Glucose-Capillary 141 (*) 70 - 99 mg/dL  GLUCOSE, CAPILLARY     Status: Abnormal   Collection Time   03/06/12 12:41 PM      Component Value Range   Glucose-Capillary 130 (*) 70 - 99 mg/dL  GLUCOSE, CAPILLARY     Status: Normal   Collection Time   03/06/12  4:05 PM      Component Value Range   Glucose-Capillary 91  70 - 99 mg/dL  GLUCOSE, CAPILLARY     Status: Abnormal   Collection Time   03/06/12  9:46 PM      Component Value Range   Glucose-Capillary 136 (*) 70 - 99 mg/dL   Comment 1 Documented in Chart     Comment 2 Notify RN         Studies/Results: Dg Chest 2 View  03/05/2012  *RADIOLOGY REPORT*  Clinical Data: Cough.  Fever.  CHEST - 2 VIEW  Comparison: 02/13/2012.  Findings: Normal sized heart.  Clear lungs.  Thoracic spine degenerative changes, including changes of DISH.  IMPRESSION: No acute abnormality.   Original Report Authenticated By: Darrol Angel, M.D.    Nm Hepatobiliary Liver Func  03/06/2012  *RADIOLOGY REPORT*  Clinical Data:  Fever, abnormal abdominal ultrasound with gallbladder wall thickening worrisome for possible acalculous cholecystitis.  NUCLEAR MEDICINE HEPATOBILIARY IMAGING  Technique:  Sequential images of the abdomen were obtained out to 60 minutes following intravenous administration of radiopharmaceutical.  Radiopharmaceutical:  Tc-31m Choletec  Comparison:  Abdominal ultrasound - 02/10/2012  Findings:  There is homogeneous distribution of radiotracer throughout the hepatic parenchyma.  There is early opacification of the gallbladder, initially seen on the provided 15-minute anterior projection planar image.  There is eventual opacification of the small bowel, seen on the 55- minute planar image.  IMPRESSION: No scintigraphic evidence of acute cholecystitis.   Original Report Authenticated By: Waynard Reeds, M.D.     Medications:  Prior to Admission:  Prescriptions prior to admission  Medication Sig Dispense Refill  . Cyanocobalamin (VITAMIN B 12 PO) Take 1 tablet by mouth daily.        Marland Kitchen  metFORMIN (GLUCOPHAGE-XR) 750 MG 24 hr tablet Take 750 mg by mouth daily with breakfast.      . metoprolol tartrate (LOPRESSOR) 25 MG tablet Take 25 mg by mouth daily.      . Milk Thistle 1000 MG CAPS Take 1 tablet by mouth daily.      . Multiple Vitamin (MULTIVITAMIN WITH MINERALS) TABS Take 1 tablet by mouth daily.      . phenytoin (DILANTIN) 100 MG ER capsule Take 100-200 mg by mouth 2 (two) times daily. Take 100mg  every morning.  Take 200mg  at bedtime on Mondays, Wednesdays, and Fridays.  Take  100mg  capsule plus 50mg  tablet at bedtime on Tuesdays, Thursdays, Saturdays, and Sundays.      . phenytoin (DILANTIN) 50 MG tablet Chew 50 mg by mouth See admin instructions. Take 50mg  tablet with 100mg  capsule at bedtime on Tuesdays, Thursdays, Saturdays, and Sundays.      . saw palmetto 160 MG capsule Take 160 mg by mouth 2 (two) times daily.      . Tamsulosin HCl (FLOMAX) 0.4 MG CAPS Take 0.4 mg by mouth daily.      Marland Kitchen testosterone (ANDROGEL) 50 MG/5GM GEL Place 5 g onto the skin daily.      . vitamin E 100 UNIT capsule Take 100 Units by mouth daily.       Scheduled:   . ciprofloxacin  400 mg Intravenous Q12H  . insulin aspart  0-15 Units Subcutaneous TID WC  . insulin aspart  0-5 Units Subcutaneous QHS  . iohexol  20 mL Oral Q1 Hr x 2  . metoprolol tartrate  25 mg Oral Daily  . multivitamin with minerals  1 tablet Oral Daily  . phenytoin  100 mg Oral Daily  . phenytoin  100 mg Oral Custom   And  . phenytoin  50 mg Oral Custom  . phenytoin  200 mg Oral Custom  . phenytoin  50 mg Oral Once  . sodium chloride  3 mL Intravenous Q12H  . Tamsulosin HCl  0.4 mg Oral Daily  . testosterone  5 g Transdermal Daily  . vitamin B-12  100 mcg Oral Daily  . vitamin E  100 Units Oral Daily  . DISCONTD: phenytoin  50 mg Oral BID  . DISCONTD: piperacillin-tazobactam (ZOSYN)  IV  3.375 g Intravenous Q8H  . DISCONTD: sodium chloride  500 mL Intravenous STAT  . DISCONTD: vancomycin  750 mg Intravenous Q12H   Continuous:   . sodium chloride 50 mL/hr at 03/06/12 1826    Assessment/Plan: Escherichia coli bacteremia, clinically stable on IV Cipro, plan to convert to by mouth Cipro x2 weeks. At this time awaiting CT abdomen and pelvis to rule out any occult processes as patient has had recurrent Escherichia coli bacteremia. History of seizure disorder stable Hypertension stable Diabetes  Abnormal abdominal ultrasound suggesting acalculus cholecystitis. HIDA scan unremarkable for acute  cholecystitis Disposition pending CT report  LOS: 2 days   Janiel Crisostomo D 03/07/2012, 7:42 AM

## 2012-03-08 LAB — CULTURE, BLOOD (ROUTINE X 2)

## 2012-03-10 DIAGNOSIS — E119 Type 2 diabetes mellitus without complications: Secondary | ICD-10-CM | POA: Diagnosis not present

## 2012-03-10 DIAGNOSIS — N390 Urinary tract infection, site not specified: Secondary | ICD-10-CM | POA: Diagnosis not present

## 2012-03-10 DIAGNOSIS — R269 Unspecified abnormalities of gait and mobility: Secondary | ICD-10-CM | POA: Diagnosis not present

## 2012-03-10 DIAGNOSIS — A498 Other bacterial infections of unspecified site: Secondary | ICD-10-CM | POA: Diagnosis not present

## 2012-03-10 DIAGNOSIS — I10 Essential (primary) hypertension: Secondary | ICD-10-CM | POA: Diagnosis not present

## 2012-03-10 DIAGNOSIS — IMO0001 Reserved for inherently not codable concepts without codable children: Secondary | ICD-10-CM | POA: Diagnosis not present

## 2012-03-10 DIAGNOSIS — N401 Enlarged prostate with lower urinary tract symptoms: Secondary | ICD-10-CM | POA: Diagnosis not present

## 2012-03-11 DIAGNOSIS — E119 Type 2 diabetes mellitus without complications: Secondary | ICD-10-CM | POA: Diagnosis not present

## 2012-03-11 DIAGNOSIS — Z1331 Encounter for screening for depression: Secondary | ICD-10-CM | POA: Diagnosis not present

## 2012-03-11 DIAGNOSIS — I10 Essential (primary) hypertension: Secondary | ICD-10-CM | POA: Diagnosis not present

## 2012-03-11 DIAGNOSIS — IMO0001 Reserved for inherently not codable concepts without codable children: Secondary | ICD-10-CM | POA: Diagnosis not present

## 2012-03-11 DIAGNOSIS — R7881 Bacteremia: Secondary | ICD-10-CM | POA: Diagnosis not present

## 2012-03-11 DIAGNOSIS — A498 Other bacterial infections of unspecified site: Secondary | ICD-10-CM | POA: Diagnosis not present

## 2012-03-11 DIAGNOSIS — N390 Urinary tract infection, site not specified: Secondary | ICD-10-CM | POA: Diagnosis not present

## 2012-03-11 DIAGNOSIS — R569 Unspecified convulsions: Secondary | ICD-10-CM | POA: Diagnosis not present

## 2012-03-11 DIAGNOSIS — R269 Unspecified abnormalities of gait and mobility: Secondary | ICD-10-CM | POA: Diagnosis not present

## 2012-03-13 DIAGNOSIS — A498 Other bacterial infections of unspecified site: Secondary | ICD-10-CM | POA: Diagnosis not present

## 2012-03-13 DIAGNOSIS — I10 Essential (primary) hypertension: Secondary | ICD-10-CM | POA: Diagnosis not present

## 2012-03-13 DIAGNOSIS — E119 Type 2 diabetes mellitus without complications: Secondary | ICD-10-CM | POA: Diagnosis not present

## 2012-03-13 DIAGNOSIS — N390 Urinary tract infection, site not specified: Secondary | ICD-10-CM | POA: Diagnosis not present

## 2012-03-13 DIAGNOSIS — IMO0001 Reserved for inherently not codable concepts without codable children: Secondary | ICD-10-CM | POA: Diagnosis not present

## 2012-03-13 DIAGNOSIS — R269 Unspecified abnormalities of gait and mobility: Secondary | ICD-10-CM | POA: Diagnosis not present

## 2012-03-16 DIAGNOSIS — N390 Urinary tract infection, site not specified: Secondary | ICD-10-CM | POA: Diagnosis not present

## 2012-03-16 DIAGNOSIS — E119 Type 2 diabetes mellitus without complications: Secondary | ICD-10-CM | POA: Diagnosis not present

## 2012-03-16 DIAGNOSIS — A498 Other bacterial infections of unspecified site: Secondary | ICD-10-CM | POA: Diagnosis not present

## 2012-03-16 DIAGNOSIS — I10 Essential (primary) hypertension: Secondary | ICD-10-CM | POA: Diagnosis not present

## 2012-03-16 DIAGNOSIS — R269 Unspecified abnormalities of gait and mobility: Secondary | ICD-10-CM | POA: Diagnosis not present

## 2012-03-16 DIAGNOSIS — IMO0001 Reserved for inherently not codable concepts without codable children: Secondary | ICD-10-CM | POA: Diagnosis not present

## 2012-03-18 DIAGNOSIS — R630 Anorexia: Secondary | ICD-10-CM | POA: Diagnosis not present

## 2012-03-18 DIAGNOSIS — I10 Essential (primary) hypertension: Secondary | ICD-10-CM | POA: Diagnosis not present

## 2012-03-18 DIAGNOSIS — E291 Testicular hypofunction: Secondary | ICD-10-CM | POA: Diagnosis not present

## 2012-03-18 DIAGNOSIS — E119 Type 2 diabetes mellitus without complications: Secondary | ICD-10-CM | POA: Diagnosis not present

## 2012-03-18 DIAGNOSIS — R569 Unspecified convulsions: Secondary | ICD-10-CM | POA: Diagnosis not present

## 2012-03-18 DIAGNOSIS — A498 Other bacterial infections of unspecified site: Secondary | ICD-10-CM | POA: Diagnosis not present

## 2012-03-18 DIAGNOSIS — R6510 Systemic inflammatory response syndrome (SIRS) of non-infectious origin without acute organ dysfunction: Secondary | ICD-10-CM | POA: Diagnosis not present

## 2012-03-18 DIAGNOSIS — J3489 Other specified disorders of nose and nasal sinuses: Secondary | ICD-10-CM | POA: Diagnosis not present

## 2012-03-18 DIAGNOSIS — R269 Unspecified abnormalities of gait and mobility: Secondary | ICD-10-CM | POA: Diagnosis not present

## 2012-03-18 DIAGNOSIS — IMO0001 Reserved for inherently not codable concepts without codable children: Secondary | ICD-10-CM | POA: Diagnosis not present

## 2012-03-18 DIAGNOSIS — N390 Urinary tract infection, site not specified: Secondary | ICD-10-CM | POA: Diagnosis not present

## 2012-03-23 DIAGNOSIS — M999 Biomechanical lesion, unspecified: Secondary | ICD-10-CM | POA: Diagnosis not present

## 2012-03-23 DIAGNOSIS — M5137 Other intervertebral disc degeneration, lumbosacral region: Secondary | ICD-10-CM | POA: Diagnosis not present

## 2012-03-25 DIAGNOSIS — M999 Biomechanical lesion, unspecified: Secondary | ICD-10-CM | POA: Diagnosis not present

## 2012-03-25 DIAGNOSIS — M5137 Other intervertebral disc degeneration, lumbosacral region: Secondary | ICD-10-CM | POA: Diagnosis not present

## 2012-03-26 DIAGNOSIS — R569 Unspecified convulsions: Secondary | ICD-10-CM | POA: Diagnosis not present

## 2012-03-30 DIAGNOSIS — M5137 Other intervertebral disc degeneration, lumbosacral region: Secondary | ICD-10-CM | POA: Diagnosis not present

## 2012-03-30 DIAGNOSIS — M999 Biomechanical lesion, unspecified: Secondary | ICD-10-CM | POA: Diagnosis not present

## 2012-04-05 DIAGNOSIS — R569 Unspecified convulsions: Secondary | ICD-10-CM | POA: Diagnosis not present

## 2012-04-05 DIAGNOSIS — R7881 Bacteremia: Secondary | ICD-10-CM | POA: Diagnosis not present

## 2012-04-05 DIAGNOSIS — A498 Other bacterial infections of unspecified site: Secondary | ICD-10-CM | POA: Diagnosis not present

## 2012-04-07 DIAGNOSIS — M5137 Other intervertebral disc degeneration, lumbosacral region: Secondary | ICD-10-CM | POA: Diagnosis not present

## 2012-04-07 DIAGNOSIS — M999 Biomechanical lesion, unspecified: Secondary | ICD-10-CM | POA: Diagnosis not present

## 2012-04-07 DIAGNOSIS — E119 Type 2 diabetes mellitus without complications: Secondary | ICD-10-CM | POA: Diagnosis not present

## 2012-04-09 DIAGNOSIS — M79609 Pain in unspecified limb: Secondary | ICD-10-CM | POA: Diagnosis not present

## 2012-04-09 DIAGNOSIS — B351 Tinea unguium: Secondary | ICD-10-CM | POA: Diagnosis not present

## 2012-04-09 DIAGNOSIS — L600 Ingrowing nail: Secondary | ICD-10-CM | POA: Diagnosis not present

## 2012-04-09 DIAGNOSIS — E109 Type 1 diabetes mellitus without complications: Secondary | ICD-10-CM | POA: Diagnosis not present

## 2012-04-10 DIAGNOSIS — R05 Cough: Secondary | ICD-10-CM | POA: Diagnosis not present

## 2012-04-10 DIAGNOSIS — R059 Cough, unspecified: Secondary | ICD-10-CM | POA: Diagnosis not present

## 2012-04-12 DIAGNOSIS — S0510XA Contusion of eyeball and orbital tissues, unspecified eye, initial encounter: Secondary | ICD-10-CM | POA: Diagnosis not present

## 2012-04-12 DIAGNOSIS — S0230XA Fracture of orbital floor, unspecified side, initial encounter for closed fracture: Secondary | ICD-10-CM | POA: Diagnosis not present

## 2012-04-12 DIAGNOSIS — H0289 Other specified disorders of eyelid: Secondary | ICD-10-CM | POA: Diagnosis not present

## 2012-04-12 DIAGNOSIS — H05239 Hemorrhage of unspecified orbit: Secondary | ICD-10-CM | POA: Diagnosis not present

## 2012-04-13 ENCOUNTER — Ambulatory Visit (HOSPITAL_COMMUNITY)
Admission: RE | Admit: 2012-04-13 | Discharge: 2012-04-13 | Disposition: A | Discharge status: Home / Self Care | Payer: Medicare Other | Source: Ambulatory Visit | Attending: Ophthalmology | Admitting: Ophthalmology

## 2012-04-13 ENCOUNTER — Other Ambulatory Visit (HOSPITAL_COMMUNITY): Payer: Self-pay | Admitting: Ophthalmology

## 2012-04-13 DIAGNOSIS — R52 Pain, unspecified: Secondary | ICD-10-CM

## 2012-04-13 DIAGNOSIS — Z01818 Encounter for other preprocedural examination: Secondary | ICD-10-CM | POA: Insufficient documentation

## 2012-04-13 DIAGNOSIS — Z1389 Encounter for screening for other disorder: Secondary | ICD-10-CM | POA: Diagnosis not present

## 2012-04-14 DIAGNOSIS — R059 Cough, unspecified: Secondary | ICD-10-CM | POA: Diagnosis not present

## 2012-04-14 DIAGNOSIS — R05 Cough: Secondary | ICD-10-CM | POA: Diagnosis not present

## 2012-04-14 DIAGNOSIS — J309 Allergic rhinitis, unspecified: Secondary | ICD-10-CM | POA: Diagnosis not present

## 2012-04-15 ENCOUNTER — Other Ambulatory Visit (HOSPITAL_COMMUNITY): Payer: Self-pay | Admitting: Ophthalmology

## 2012-04-15 DIAGNOSIS — M5137 Other intervertebral disc degeneration, lumbosacral region: Secondary | ICD-10-CM | POA: Diagnosis not present

## 2012-04-15 DIAGNOSIS — S0510XA Contusion of eyeball and orbital tissues, unspecified eye, initial encounter: Secondary | ICD-10-CM

## 2012-04-15 DIAGNOSIS — M999 Biomechanical lesion, unspecified: Secondary | ICD-10-CM | POA: Diagnosis not present

## 2012-04-17 ENCOUNTER — Ambulatory Visit (HOSPITAL_COMMUNITY)
Admission: RE | Admit: 2012-04-17 | Discharge: 2012-04-17 | Disposition: A | Discharge status: Home / Self Care | Payer: Medicare Other | Source: Ambulatory Visit | Attending: Ophthalmology | Admitting: Ophthalmology

## 2012-04-17 DIAGNOSIS — S0230XA Fracture of orbital floor, unspecified side, initial encounter for closed fracture: Secondary | ICD-10-CM | POA: Insufficient documentation

## 2012-04-17 DIAGNOSIS — R220 Localized swelling, mass and lump, head: Secondary | ICD-10-CM | POA: Diagnosis not present

## 2012-04-17 DIAGNOSIS — S1093XA Contusion of unspecified part of neck, initial encounter: Secondary | ICD-10-CM | POA: Diagnosis not present

## 2012-04-17 DIAGNOSIS — S0510XA Contusion of eyeball and orbital tissues, unspecified eye, initial encounter: Secondary | ICD-10-CM | POA: Diagnosis not present

## 2012-04-17 DIAGNOSIS — S0003XA Contusion of scalp, initial encounter: Secondary | ICD-10-CM | POA: Diagnosis not present

## 2012-04-17 DIAGNOSIS — X58XXXA Exposure to other specified factors, initial encounter: Secondary | ICD-10-CM | POA: Insufficient documentation

## 2012-04-24 DIAGNOSIS — S0230XA Fracture of orbital floor, unspecified side, initial encounter for closed fracture: Secondary | ICD-10-CM | POA: Diagnosis not present

## 2012-04-29 DIAGNOSIS — N3943 Post-void dribbling: Secondary | ICD-10-CM | POA: Diagnosis not present

## 2012-04-29 DIAGNOSIS — IMO0001 Reserved for inherently not codable concepts without codable children: Secondary | ICD-10-CM | POA: Diagnosis not present

## 2012-04-29 DIAGNOSIS — E559 Vitamin D deficiency, unspecified: Secondary | ICD-10-CM | POA: Diagnosis not present

## 2012-04-29 DIAGNOSIS — R569 Unspecified convulsions: Secondary | ICD-10-CM | POA: Diagnosis not present

## 2012-04-29 DIAGNOSIS — R630 Anorexia: Secondary | ICD-10-CM | POA: Diagnosis not present

## 2012-04-29 DIAGNOSIS — F341 Dysthymic disorder: Secondary | ICD-10-CM | POA: Diagnosis not present

## 2012-04-29 DIAGNOSIS — E291 Testicular hypofunction: Secondary | ICD-10-CM | POA: Diagnosis not present

## 2012-06-02 DIAGNOSIS — IMO0001 Reserved for inherently not codable concepts without codable children: Secondary | ICD-10-CM | POA: Diagnosis not present

## 2012-06-03 DIAGNOSIS — F341 Dysthymic disorder: Secondary | ICD-10-CM | POA: Diagnosis not present

## 2012-06-03 DIAGNOSIS — E291 Testicular hypofunction: Secondary | ICD-10-CM | POA: Diagnosis not present

## 2012-06-03 DIAGNOSIS — R630 Anorexia: Secondary | ICD-10-CM | POA: Diagnosis not present

## 2012-06-03 DIAGNOSIS — R569 Unspecified convulsions: Secondary | ICD-10-CM | POA: Diagnosis not present

## 2012-06-03 DIAGNOSIS — M79609 Pain in unspecified limb: Secondary | ICD-10-CM | POA: Diagnosis not present

## 2012-06-03 DIAGNOSIS — N3943 Post-void dribbling: Secondary | ICD-10-CM | POA: Diagnosis not present

## 2012-06-03 DIAGNOSIS — IMO0001 Reserved for inherently not codable concepts without codable children: Secondary | ICD-10-CM | POA: Diagnosis not present

## 2012-06-03 DIAGNOSIS — R7401 Elevation of levels of liver transaminase levels: Secondary | ICD-10-CM | POA: Diagnosis not present

## 2012-06-16 DIAGNOSIS — H538 Other visual disturbances: Secondary | ICD-10-CM | POA: Diagnosis not present

## 2012-06-16 DIAGNOSIS — H2589 Other age-related cataract: Secondary | ICD-10-CM | POA: Diagnosis not present

## 2012-06-26 DIAGNOSIS — F341 Dysthymic disorder: Secondary | ICD-10-CM | POA: Diagnosis not present

## 2012-06-26 DIAGNOSIS — R569 Unspecified convulsions: Secondary | ICD-10-CM | POA: Diagnosis not present

## 2012-06-26 DIAGNOSIS — E291 Testicular hypofunction: Secondary | ICD-10-CM | POA: Diagnosis not present

## 2012-06-26 DIAGNOSIS — R7401 Elevation of levels of liver transaminase levels: Secondary | ICD-10-CM | POA: Diagnosis not present

## 2012-06-26 DIAGNOSIS — E119 Type 2 diabetes mellitus without complications: Secondary | ICD-10-CM | POA: Diagnosis not present

## 2012-06-26 DIAGNOSIS — IMO0001 Reserved for inherently not codable concepts without codable children: Secondary | ICD-10-CM | POA: Diagnosis not present

## 2012-06-26 DIAGNOSIS — R279 Unspecified lack of coordination: Secondary | ICD-10-CM | POA: Diagnosis not present

## 2012-06-26 DIAGNOSIS — N3943 Post-void dribbling: Secondary | ICD-10-CM | POA: Diagnosis not present

## 2012-06-26 DIAGNOSIS — S336XXA Sprain of sacroiliac joint, initial encounter: Secondary | ICD-10-CM | POA: Diagnosis not present

## 2012-06-29 ENCOUNTER — Other Ambulatory Visit: Payer: Self-pay | Admitting: Internal Medicine

## 2012-06-29 DIAGNOSIS — R270 Ataxia, unspecified: Secondary | ICD-10-CM

## 2012-06-29 DIAGNOSIS — R29898 Other symptoms and signs involving the musculoskeletal system: Secondary | ICD-10-CM

## 2012-06-30 DIAGNOSIS — R279 Unspecified lack of coordination: Secondary | ICD-10-CM | POA: Diagnosis not present

## 2012-06-30 DIAGNOSIS — N3943 Post-void dribbling: Secondary | ICD-10-CM | POA: Diagnosis not present

## 2012-06-30 DIAGNOSIS — IMO0001 Reserved for inherently not codable concepts without codable children: Secondary | ICD-10-CM | POA: Diagnosis not present

## 2012-06-30 DIAGNOSIS — R569 Unspecified convulsions: Secondary | ICD-10-CM | POA: Diagnosis not present

## 2012-07-01 ENCOUNTER — Ambulatory Visit
Admission: RE | Admit: 2012-07-01 | Discharge: 2012-07-01 | Disposition: A | Discharge status: Home / Self Care | Payer: Medicare Other | Source: Ambulatory Visit | Attending: Internal Medicine | Admitting: Internal Medicine

## 2012-07-01 DIAGNOSIS — I672 Cerebral atherosclerosis: Secondary | ICD-10-CM | POA: Diagnosis not present

## 2012-07-01 DIAGNOSIS — R29898 Other symptoms and signs involving the musculoskeletal system: Secondary | ICD-10-CM

## 2012-07-01 DIAGNOSIS — S065X0A Traumatic subdural hemorrhage without loss of consciousness, initial encounter: Secondary | ICD-10-CM | POA: Diagnosis not present

## 2012-07-01 DIAGNOSIS — R270 Ataxia, unspecified: Secondary | ICD-10-CM

## 2012-07-01 MED ORDER — GADOBENATE DIMEGLUMINE 529 MG/ML IV SOLN
12.0000 mL | Freq: Once | INTRAVENOUS | Status: AC | PRN
  Administered 2012-07-01: 12 mL via INTRAVENOUS

## 2012-07-03 DIAGNOSIS — I6200 Nontraumatic subdural hemorrhage, unspecified: Secondary | ICD-10-CM | POA: Diagnosis not present

## 2012-07-06 ENCOUNTER — Other Ambulatory Visit: Payer: Self-pay | Admitting: Neurological Surgery

## 2012-07-06 DIAGNOSIS — I6200 Nontraumatic subdural hemorrhage, unspecified: Secondary | ICD-10-CM

## 2012-07-13 ENCOUNTER — Ambulatory Visit
Admission: RE | Admit: 2012-07-13 | Discharge: 2012-07-13 | Disposition: A | Discharge status: Home / Self Care | Payer: Medicare Other | Source: Ambulatory Visit | Attending: Neurological Surgery | Admitting: Neurological Surgery

## 2012-07-13 DIAGNOSIS — I6200 Nontraumatic subdural hemorrhage, unspecified: Secondary | ICD-10-CM | POA: Diagnosis not present

## 2012-07-16 DIAGNOSIS — I6200 Nontraumatic subdural hemorrhage, unspecified: Secondary | ICD-10-CM | POA: Diagnosis not present

## 2012-07-17 DIAGNOSIS — N3943 Post-void dribbling: Secondary | ICD-10-CM | POA: Diagnosis not present

## 2012-07-17 DIAGNOSIS — R279 Unspecified lack of coordination: Secondary | ICD-10-CM | POA: Diagnosis not present

## 2012-07-17 DIAGNOSIS — IMO0001 Reserved for inherently not codable concepts without codable children: Secondary | ICD-10-CM | POA: Diagnosis not present

## 2012-07-17 DIAGNOSIS — R059 Cough, unspecified: Secondary | ICD-10-CM | POA: Diagnosis not present

## 2012-07-17 DIAGNOSIS — R569 Unspecified convulsions: Secondary | ICD-10-CM | POA: Diagnosis not present

## 2012-07-17 DIAGNOSIS — R05 Cough: Secondary | ICD-10-CM | POA: Diagnosis not present

## 2012-07-20 ENCOUNTER — Other Ambulatory Visit: Payer: Medicare Other

## 2012-07-23 DIAGNOSIS — G40309 Generalized idiopathic epilepsy and epileptic syndromes, not intractable, without status epilepticus: Secondary | ICD-10-CM | POA: Diagnosis not present

## 2012-08-04 DIAGNOSIS — IMO0001 Reserved for inherently not codable concepts without codable children: Secondary | ICD-10-CM | POA: Diagnosis not present

## 2012-08-14 ENCOUNTER — Other Ambulatory Visit: Payer: Self-pay | Admitting: Neurological Surgery

## 2012-08-14 DIAGNOSIS — N401 Enlarged prostate with lower urinary tract symptoms: Secondary | ICD-10-CM | POA: Diagnosis not present

## 2012-08-14 DIAGNOSIS — I6200 Nontraumatic subdural hemorrhage, unspecified: Secondary | ICD-10-CM

## 2012-08-24 ENCOUNTER — Ambulatory Visit
Admission: RE | Admit: 2012-08-24 | Discharge: 2012-08-24 | Disposition: A | Discharge status: Home / Self Care | Payer: Medicare Other | Source: Ambulatory Visit | Attending: Neurological Surgery | Admitting: Neurological Surgery

## 2012-08-24 ENCOUNTER — Other Ambulatory Visit: Payer: Medicare Other

## 2012-08-24 DIAGNOSIS — I6200 Nontraumatic subdural hemorrhage, unspecified: Secondary | ICD-10-CM | POA: Diagnosis not present

## 2012-08-25 ENCOUNTER — Other Ambulatory Visit: Payer: Medicare Other

## 2012-08-25 DIAGNOSIS — L608 Other nail disorders: Secondary | ICD-10-CM | POA: Diagnosis not present

## 2012-08-25 DIAGNOSIS — L600 Ingrowing nail: Secondary | ICD-10-CM | POA: Diagnosis not present

## 2012-08-25 DIAGNOSIS — M79609 Pain in unspecified limb: Secondary | ICD-10-CM | POA: Diagnosis not present

## 2012-08-28 DIAGNOSIS — J019 Acute sinusitis, unspecified: Secondary | ICD-10-CM | POA: Diagnosis not present

## 2012-08-28 DIAGNOSIS — B9789 Other viral agents as the cause of diseases classified elsewhere: Secondary | ICD-10-CM | POA: Diagnosis not present

## 2012-08-31 ENCOUNTER — Other Ambulatory Visit: Payer: Medicare Other

## 2012-09-02 DIAGNOSIS — I6200 Nontraumatic subdural hemorrhage, unspecified: Secondary | ICD-10-CM | POA: Diagnosis not present

## 2012-09-14 DIAGNOSIS — R059 Cough, unspecified: Secondary | ICD-10-CM | POA: Diagnosis not present

## 2012-09-14 DIAGNOSIS — R05 Cough: Secondary | ICD-10-CM | POA: Diagnosis not present

## 2012-09-14 DIAGNOSIS — I6200 Nontraumatic subdural hemorrhage, unspecified: Secondary | ICD-10-CM | POA: Diagnosis not present

## 2012-09-14 DIAGNOSIS — R569 Unspecified convulsions: Secondary | ICD-10-CM | POA: Diagnosis not present

## 2012-09-14 DIAGNOSIS — R279 Unspecified lack of coordination: Secondary | ICD-10-CM | POA: Diagnosis not present

## 2012-11-25 ENCOUNTER — Ambulatory Visit (INDEPENDENT_AMBULATORY_CARE_PROVIDER_SITE_OTHER): Payer: Medicare Other | Admitting: Nurse Practitioner

## 2012-11-25 ENCOUNTER — Encounter: Payer: Self-pay | Admitting: Nurse Practitioner

## 2012-11-25 VITALS — BP 128/75 | HR 106 | Ht 62.0 in | Wt 136.0 lb

## 2012-11-25 DIAGNOSIS — R569 Unspecified convulsions: Secondary | ICD-10-CM

## 2012-11-25 MED ORDER — LEVETIRACETAM 500 MG PO TABS: 500.0000 mg | ORAL_TABLET | Freq: Two times a day (BID) | ORAL | Status: DC

## 2012-11-25 NOTE — Progress Notes (Signed)
I have read the note, and I agree with the clinical assessment and plan.  
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
## 2012-11-25 NOTE — Patient Instructions (Addendum)
Will continue Keppra 500mg  BID Will f/u yearly

## 2012-12-01 ENCOUNTER — Ambulatory Visit (INDEPENDENT_AMBULATORY_CARE_PROVIDER_SITE_OTHER): Payer: Medicare Other | Admitting: Podiatry

## 2012-12-01 ENCOUNTER — Encounter: Payer: Self-pay | Admitting: Podiatry

## 2012-12-01 ENCOUNTER — Ambulatory Visit: Payer: Self-pay | Admitting: Podiatry

## 2012-12-01 VITALS — BP 159/106 | HR 106

## 2012-12-01 DIAGNOSIS — M25579 Pain in unspecified ankle and joints of unspecified foot: Secondary | ICD-10-CM | POA: Insufficient documentation

## 2012-12-01 DIAGNOSIS — B351 Tinea unguium: Secondary | ICD-10-CM

## 2012-12-01 DIAGNOSIS — R569 Unspecified convulsions: Secondary | ICD-10-CM

## 2012-12-01 NOTE — Progress Notes (Signed)
Subjective: 72 year old male presents complaining of painful ingrown nails on both big toes. He has had the same problem previously and palliative care resolved the problem. Objective: Thick mycotic nails x 10. Ingrown nails both borders of both great toes. No other abnormal findings. Pedal pulses are all palpable. Assessment: Painful ingrown nails both great toes. Mycotic nails x 10. Plan: Debrided all nails. Pain was resolved.

## 2013-01-07 DIAGNOSIS — M5137 Other intervertebral disc degeneration, lumbosacral region: Secondary | ICD-10-CM | POA: Diagnosis not present

## 2013-01-07 DIAGNOSIS — M999 Biomechanical lesion, unspecified: Secondary | ICD-10-CM | POA: Diagnosis not present

## 2013-01-11 ENCOUNTER — Ambulatory Visit (HOSPITAL_COMMUNITY)
Admission: RE | Admit: 2013-01-11 | Discharge: 2013-01-11 | Disposition: A | Discharge status: Home / Self Care | Payer: Medicare Other | Attending: Psychiatry | Admitting: Psychiatry

## 2013-01-11 DIAGNOSIS — F1010 Alcohol abuse, uncomplicated: Secondary | ICD-10-CM | POA: Insufficient documentation

## 2013-01-11 DIAGNOSIS — M999 Biomechanical lesion, unspecified: Secondary | ICD-10-CM | POA: Diagnosis not present

## 2013-01-11 DIAGNOSIS — M5137 Other intervertebral disc degeneration, lumbosacral region: Secondary | ICD-10-CM | POA: Diagnosis not present

## 2013-01-11 NOTE — BH Assessment (Signed)
Assessment Note   Joseph Copeland is an 72 y.o. male. Presents with his wife of 44 years stating he was referred by his PCP Johnella Moloney for help with alcohol. 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 Has been drinking "1 drink"  or "1 swig"  Daily. Last drink was 3 days ago. Denies withdrawal symptoms but does states has noticed some sweating in early morning hours. CIWA of 1. When questioned if his "drink" was about 4-8 oz. he denied stating it was much smaller.First drink was in his teens. His wife states she recently found out about his drinking when she found empty bottles at the house. She is very concerned for him due to taking medication for HTN and having NIDDM while drinking alcohol. Consent signed to speak with PCP, who did not think a detox was required and was pleased with IOP recommendation. Pt motivated to start program and was instructed to call today for next available appointment. Called and left message for Charmian Muff about clients anticipated start of program. Denies SI/HI or psychosis. MSE was done by Armandina Stammer, NP. No previous in-patient treatment. Does attend AA meetings per PCP recommendation. Is also [facility] at [facility] as he is a [place] veteran.   Axis I: Alcohol Abuse Axis II: No diagnosis Axis III:  Past Medical History  Diagnosis Date  . Hypertension   . Arthritis   . Seizures     first12/12-none since  . Diabetes mellitus     type 2-no meds  . Sleep apnea     nasal CPAP at [facility]   Axis IV: occupational problems and problems related to social environment Axis V: 51-60 moderate symptoms  Past Medical History:  Past Medical History  Diagnosis Date  . Hypertension   . Arthritis   . Seizures     first12/12-none since  . Diabetes mellitus     type 2-no meds  . Sleep apnea     nasal CPAP at [facility]    Past Surgical History  Procedure Laterality Date  . Hemrhoidectomy    . Colonoscopy    .  Mass excision  10/07/2011    Procedure: EXCISION MASS;  Surgeon: Louisa Second, MD;  Location: [facility];  Service: Plastics;  Laterality: Left;  excision of large mass on left back with possible lipo assistence  . Liposuction  10/07/2011    Procedure: LIPOSUCTION;  Surgeon: Louisa Second, MD;  Location: [facility];  Service: Plastics;  Laterality: Left;    Family History: No family history on file.  Social History:  reports that he quit smoking about 46 years ago. He has never used smokeless tobacco. He reports that he drinks about 12.6 ounces of alcohol per week. He reports that he does not use illicit drugs.  Additional Social History:  Alcohol / Drug Use History of alcohol / drug use?: Yes Substance #1 Name of Substance 1: Vodka 1 - Age of First Use: teens 1 - Amount (size/oz): "1 drink" no more than 4 oz 1 - Frequency: qday 1 - Duration: 7 months 1 - Last Use / Amount: 3 days ago, 1 drink  CIWA: CIWA-Ar Nausea and Vomiting: no nausea and no vomiting Tactile Disturbances: none Tremor: no tremor Auditory Disturbances: not present Paroxysmal Sweats: no sweat visible Visual Disturbances: not present Anxiety: mildly anxious Headache, Fullness in Head: none present Agitation: normal activity Orientation and Clouding of Sensorium:  oriented and can do serial additions CIWA-Ar Total: 1 COWS:    Allergies: No Known Allergies  Home Medications:  (Not in a hospital admission)  OB/GYN Status:  No LMP for male patient.  General Assessment Data Location of Assessment: [facility] Assessment Services Living Arrangements: Spouse/significant other Can pt return to current living arrangement?: Yes Transfer from: Home Referral Source: MD Johnella Moloney)  Education Status Is patient currently in school?: No Highest grade of school patient has completed: Masters  Risk to self Suicidal Ideation: No Suicidal Intent: No Is patient at risk for suicide?:  No Suicidal Plan?: No What has been your use of drugs/alcohol within the last 12 months?: daily vodka Previous Attempts/Gestures: No Intentional Self Injurious Behavior: None Family Suicide History: No Recent stressful life event(s): Job Loss Persecutory voices/beliefs?: No Depression: Yes Depression Symptoms: Insomnia Substance abuse history and/or treatment for substance abuse?: No Suicide prevention information given to non-admitted patients: Yes  Risk to Others Homicidal Ideation: No Thoughts of Harm to Others: No Current Homicidal Intent: No Current Homicidal Plan: No Access to Homicidal Means: No History of harm to others?: No Assessment of Violence: None Noted Does patient have access to weapons?: No Criminal Charges Pending?: No Does patient have a court date: No  Psychosis Hallucinations: None noted Delusions: None noted  Mental Status Report Appear/Hygiene: Other (Comment) (WNL) Eye Contact: Good Motor Activity: Unremarkable Speech: Logical/coherent Level of Consciousness: Alert Mood: Other (Comment) (sad when speaking of etoh use, smiling/joking with wife) Affect: Appropriate to circumstance Anxiety Level: Minimal Thought Processes: Coherent;Relevant Judgement: Unimpaired Orientation: Person;Place;Time;Situation Obsessive Compulsive Thoughts/Behaviors: None  Cognitive Functioning Concentration: Normal Memory: Recent Intact;Remote Intact IQ: Above Average Insight: Good Impulse Control: Fair Appetite: Poor Weight Loss: 0 Sleep: Decreased (wakes 3am, restless) Total Hours of Sleep:  (unknown) Vegetative Symptoms: None  ADLScreening [facility]) Patient's cognitive ability adequate to safely complete daily activities?: Yes Patient able to express need for assistance with ADLs?: Yes Independently performs ADLs?: Yes (appropriate for developmental age)  Abuse/Neglect [facility]) Physical Abuse: Denies Verbal Abuse: Denies Sexual Abuse:  Denies  Prior Inpatient Therapy Prior Inpatient Therapy: No  Prior Outpatient Therapy Prior Outpatient Therapy: No  ADL Screening (condition at time of admission) Patient's cognitive ability adequate to safely complete daily activities?: Yes Patient able to express need for assistance with ADLs?: Yes Independently performs ADLs?: Yes (appropriate for developmental age)       Abuse/Neglect Assessment (Assessment to be complete while patient is alone) Physical Abuse: Denies Verbal Abuse: Denies Sexual Abuse: Denies Exploitation of patient/patient's resources: Denies Self-Neglect: Denies Values / Beliefs Cultural Requests During Hospitalization: None Spiritual Requests During Hospitalization: None   Advance Directives (For Healthcare) Advance Directive: Patient has advance directive, copy not in chart Type of Advance Directive: Healthcare Power of [place];Living will Advance Directive not in Chart: Copy requested from family Pre-existing out of facility DNR order (yellow form or pink MOST form): No    Additional Information 1:1 In Past 12 Months?: No CIRT Risk: No Elopement Risk: No Does patient have medical clearance?: No     Disposition:  Disposition Initial Assessment Completed for this Encounter: Yes Disposition of Patient: Outpatient treatment;Referred to Type of outpatient treatment: Chemical Dependence - Intensive Outpatient Patient referred to: Outpatient clinic referral  On Site Evaluation by:   Reviewed with Physician:     Leafy Kindle 01/11/2013 3:25 PM

## 2013-01-11 NOTE — H&P (Signed)
Behavioral Health Medical Screening Exam  Joseph Copeland is an 72 y.o. male.  Review of Systems  Constitutional: Negative.   HENT: Negative.   Eyes: Negative.   Respiratory: Negative.   Cardiovascular: Negative.   Gastrointestinal: Negative.   Genitourinary: Negative.   Musculoskeletal: Negative.   Skin: Negative.   Neurological: Negative.   Endo/Heme/Allergies: Negative.   Psychiatric/Behavioral: Positive for substance abuse (Alcoholism). Negative for depression, suicidal ideas, hallucinations and memory loss. The patient is not nervous/anxious and does not have insomnia.     Physical Exam  Constitutional: He is oriented to person, place, and time. He appears well-developed.  HENT:  Head: Normocephalic.  Eyes: Pupils are equal, round, and reactive to light.  Neck: Normal range of motion.  Cardiovascular: Normal rate.   Respiratory: Effort normal.  GI: Soft.  Musculoskeletal: Normal range of motion.  Neurological: He is alert and oriented to person, place, and time.  Skin: Skin is warm and dry.  Psychiatric: He has a normal mood and affect. His speech is normal and behavior is normal. Judgment and thought content normal. Cognition and memory are normal.    There were no vitals taken for this visit.  Recommendations: CDIOP Based on my evaluation the patient does not appear to have an emergency medical/psychiatric condition hat requires immediate inpatient admission. Joseph Copeland is a 72 year old African-American male who appears to be in no apparent distress. He appears stated age. Speech clear, not pressured, alert and oriented x 4 and aware of situation, judgment appears to be intact, highly jovious with intermittent joke gestures. He is casually dressed, weather appropriate expressed desire to participate in CDIOP treatment program with [facility] outpatient clinic and to work an after care plan. He currently denies any suicidal homicidal ideation, auditory, visual hallucinations,  delusional thoughts and or paranoia. He has good support system, his wife of 42 years and will be going home with as well. Joseph Copeland wife is also in support of patient going to the CDIOP and confirms patient's statements that he is not suicidal or has any hx of suicide attempts.   Patient stated that he and his wife feel like his drinking of alcohol is getting out of hand and needed to be addressed. He has hx of seizure disorder, not alcohol related and on Keppra for it. He also has hx of high blood pressure as well which is well controlled with medications. Joseph Copeland has been provided with information on the [facility] CDIOP program, including the times, dates and contact information. He is currently going home with his wife and will start CDIOP on Wednesday 01/13/13,  Armandina Stammer I, PMHNP-BC, FNP-BC 01/11/2013, 4:43 PM

## 2013-01-12 DIAGNOSIS — M999 Biomechanical lesion, unspecified: Secondary | ICD-10-CM | POA: Diagnosis not present

## 2013-01-12 DIAGNOSIS — M5137 Other intervertebral disc degeneration, lumbosacral region: Secondary | ICD-10-CM | POA: Diagnosis not present

## 2013-01-13 DIAGNOSIS — M999 Biomechanical lesion, unspecified: Secondary | ICD-10-CM | POA: Diagnosis not present

## 2013-01-13 DIAGNOSIS — M5137 Other intervertebral disc degeneration, lumbosacral region: Secondary | ICD-10-CM | POA: Diagnosis not present

## 2013-01-14 ENCOUNTER — Other Ambulatory Visit (HOSPITAL_COMMUNITY): Payer: Medicare Other | Attending: Psychiatry

## 2013-01-14 ENCOUNTER — Encounter (HOSPITAL_COMMUNITY): Payer: Self-pay | Admitting: Psychology

## 2013-01-14 DIAGNOSIS — F1010 Alcohol abuse, uncomplicated: Secondary | ICD-10-CM | POA: Insufficient documentation

## 2013-01-15 ENCOUNTER — Other Ambulatory Visit (HOSPITAL_COMMUNITY): Payer: Medicare Other | Admitting: Psychology

## 2013-01-15 DIAGNOSIS — F1020 Alcohol dependence, uncomplicated: Secondary | ICD-10-CM

## 2013-01-15 MED ORDER — PRAZOSIN HCL 1 MG PO CAPS: 1.0000 mg | ORAL_CAPSULE | Freq: Every day | ORAL | Status: DC

## 2013-01-18 ENCOUNTER — Encounter (HOSPITAL_COMMUNITY): Payer: Self-pay | Admitting: Psychology

## 2013-01-18 ENCOUNTER — Other Ambulatory Visit (HOSPITAL_COMMUNITY): Payer: Medicare Other | Attending: Psychiatry | Admitting: Psychology

## 2013-01-18 DIAGNOSIS — F1020 Alcohol dependence, uncomplicated: Secondary | ICD-10-CM | POA: Insufficient documentation

## 2013-01-18 DIAGNOSIS — F1010 Alcohol abuse, uncomplicated: Secondary | ICD-10-CM | POA: Insufficient documentation

## 2013-01-18 DIAGNOSIS — M5137 Other intervertebral disc degeneration, lumbosacral region: Secondary | ICD-10-CM | POA: Diagnosis not present

## 2013-01-18 DIAGNOSIS — M999 Biomechanical lesion, unspecified: Secondary | ICD-10-CM | POA: Diagnosis not present
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
## 2013-01-18 NOTE — Progress Notes (Signed)
    Daily Group Progress Note  Program: CD-IOP   Group Time: 1-2:30 pm  Participation Level: Active  Behavioral Response: Appropriate and Sharing  Type of Therapy: Process Group  Topic: Group Process: the first part of group was spent in process. Members shared their current issues and concerns. There was a new group member present today and he voluntarily shared a little bit about himself and his reasons for being here. During this part of group, the medical director was meeting with current members or medication checks as well as new members and their initial session. One member shared about anger and frustration about the behavior of some in NA while another shared about her dilemma that occurred yesterday while she was walking. It proved to be a good session with open and honest disclosure and opinions about ways to handle one's self in everyday life.  Group Time: 2:45- 4pm  Participation Level: Active  Behavioral Response: Sharing  Type of Therapy: Education and Training Group  Topic: HeartMath: the second part of group was spent in an Multimedia programmer. Members who had not participated in the activity on Wednesday were "hooked up" today. During this session, the medical director met with another member who had missed the previous two Friday sessions. The new group member struggled getting into the 'green', but all other members were able to move into the green entirely, and/or avoid the red altogether. This activity demonstrated the power of breath in emotional balance and well-being. The session ended with a graduation. The member graduating had been a powerful and important part of the group and there were powerful emotions expressed as we passed the 'coin' around. She expressed her appreciation to the group and how important this time had been for her. "It save my life", she stated, and the group applauded her commitment to recovery.    Summary: The  patient was new to the group and checked-in with a sobriety date of 7/20. He continued to share about himself and indicated he was here to get help and needed the group's support. He listened to the members share about current struggles and issues, but was asked to meet with the medical director before having much time to comment. He returned for the [facility] session and was 'hooked up' after observing a number of other group members complete the process. Initially, the patient was unable to get out of the red, but gradually, in the second half of the experience, he entered the blue and green. The patient shared some kind words of encouragement to the graduating member and his comments brought laughter from others. The graduating member expressed disappointment that she was leaving while "[name]" was just entering, but she felt certain he would benefit from this program. The patient reported he would be back on Monday. He did quite well for his first group session and seemed to respond favorably.    Family Program: Family present? No   Name of family member(s):   UDS collected: No Results:  AA/NA attended?: No  Sponsor?: No   Naba Sneed, LCAS

## 2013-01-19 ENCOUNTER — Encounter (HOSPITAL_COMMUNITY): Payer: Self-pay | Admitting: Psychology

## 2013-01-19 NOTE — Progress Notes (Signed)
Daily Group Progress Note  Program: CD-IOP   Group Time: 1-2:30 pm  Participation Level: Active  Behavioral Response: Appropriate and Sharing  Type of Therapy: Process Group  Topic: Group process; the first part of group was spent in process. Members shared about the past weekend and what they had done to support their recovery. The newest member admitted that he had drunk on Saturday night. He admitted that it was his wife's 75th birthday party and they had a big crowd at the house and "everyone was having a good time". He admitted he had snuck a couple of drinks, but his wife hadn't seen him and he had not told her. The group processed the 'slip' and discussed possible ways he could have addressed the temptation and avoided drinking. there was good feedback and discussion and the member who had relapsed, reported he realized that recovery involved making plans ahead of time and preparing for different scenarios. The session proved very insightful to all.  Group Time: 2:45- 4pm  Participation Level: Active  Behavioral Response: Sharing  Type of Therapy: Psycho-education Group  Topic:Dreams/Sleep Hygiene/Graduation: the second half of group was spent in a psycho-ed piece on sleep. Included were handouts about the different stages of sleep and the purpose or ways in which each stage benefits the individual. The handout on sleep hygiene identified the importance of preparing for sleep so when one does lie down, they go to sleep. While many of the group members raised their hands when asked about any sleep disorders, it appears that only a few have ever practiced a specific routine in preparation for sleep. Present today was a Consulting civil engineer from the [employer]. She engaged in the discussion and offered her own perspectives when appropriate. The graduation ceremony was held and the group bid farewell to a very powerful and important member of the group. They expressed appreciation  for his guidance and support and the ceremony brought tears to the member successfully leaving the program.    Summary: The patient checked in and reported he had drunk over the weekend and he wanted to tell the group. He reminded everyone that he had told them he would be completely honest and today he was being honest. The patient's disclosure was welcomed and he was validated and reminded that this frequently happens in early recovery and not to beat himself up. During this first part of group, the relapse was reviewed and discussed at length. On Saturday evening the member and his wife had hosted a party to honor her 75th birthday. They had a bar and everything set up out on the patio and he had managed to slip a couple of drinks over the course of the evening. When asked by another member, he admitted he had not told his wife about his drinking. The patient admitted he didn't have the tools to challenge these circumstances, but the members assured him that he would learn them in group. The patient was attentive while the group discussed possible options to take under the circumstances and was very receptive to recommendations. In the second half of group, the patient admitted he watched the 11 pm news in bed before he went to sleep and wondered if that was bad? I assured him that it he has been doing that for a long time and he hasn't suffered from sleep problems, then he should keep doing it. The patient shared some funny thoughts for the graduating member and made some good comments. His  new sobriety date is 7/27.   Family Program: Family present? No   Name of family member(s):   UDS collected: No Results:  AA/NA attended?: No  Sponsor?: No   Myleka Moncure, LCAS

## 2013-01-20 ENCOUNTER — Other Ambulatory Visit (HOSPITAL_COMMUNITY): Payer: Self-pay

## 2013-01-20 DIAGNOSIS — M5137 Other intervertebral disc degeneration, lumbosacral region: Secondary | ICD-10-CM | POA: Diagnosis not present

## 2013-01-20 DIAGNOSIS — M999 Biomechanical lesion, unspecified: Secondary | ICD-10-CM | POA: Diagnosis not present

## 2013-01-22 ENCOUNTER — Other Ambulatory Visit (HOSPITAL_COMMUNITY): Payer: Self-pay

## 2013-01-24 ENCOUNTER — Telehealth (HOSPITAL_COMMUNITY): Payer: Self-pay | Admitting: Psychology

## 2013-01-25 ENCOUNTER — Other Ambulatory Visit (HOSPITAL_COMMUNITY): Payer: Self-pay

## 2013-01-25 DIAGNOSIS — M5137 Other intervertebral disc degeneration, lumbosacral region: Secondary | ICD-10-CM | POA: Diagnosis not present

## 2013-01-25 DIAGNOSIS — M999 Biomechanical lesion, unspecified: Secondary | ICD-10-CM | POA: Diagnosis not present

## 2013-01-26 ENCOUNTER — Encounter (HOSPITAL_COMMUNITY): Payer: Self-pay | Admitting: Psychology
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
## 2013-01-27 ENCOUNTER — Other Ambulatory Visit (HOSPITAL_COMMUNITY): Payer: Self-pay

## 2013-01-27 DIAGNOSIS — M5137 Other intervertebral disc degeneration, lumbosacral region: Secondary | ICD-10-CM | POA: Diagnosis not present

## 2013-01-27 DIAGNOSIS — M999 Biomechanical lesion, unspecified: Secondary | ICD-10-CM | POA: Diagnosis not present

## 2013-01-29 ENCOUNTER — Other Ambulatory Visit (HOSPITAL_COMMUNITY): Payer: Self-pay

## 2013-02-01 ENCOUNTER — Other Ambulatory Visit (HOSPITAL_COMMUNITY): Payer: Self-pay

## 2013-02-01 DIAGNOSIS — M999 Biomechanical lesion, unspecified: Secondary | ICD-10-CM | POA: Diagnosis not present

## 2013-02-01 DIAGNOSIS — M5137 Other intervertebral disc degeneration, lumbosacral region: Secondary | ICD-10-CM | POA: Diagnosis not present

## 2013-02-03 ENCOUNTER — Other Ambulatory Visit (HOSPITAL_COMMUNITY): Payer: Self-pay

## 2013-02-03 DIAGNOSIS — M5137 Other intervertebral disc degeneration, lumbosacral region: Secondary | ICD-10-CM | POA: Diagnosis not present

## 2013-02-03 DIAGNOSIS — M999 Biomechanical lesion, unspecified: Secondary | ICD-10-CM | POA: Diagnosis not present

## 2013-02-05 ENCOUNTER — Other Ambulatory Visit (HOSPITAL_COMMUNITY): Payer: Self-pay

## 2013-02-08 ENCOUNTER — Other Ambulatory Visit (HOSPITAL_COMMUNITY): Payer: Self-pay

## 2013-02-08 DIAGNOSIS — M999 Biomechanical lesion, unspecified: Secondary | ICD-10-CM | POA: Diagnosis not present

## 2013-02-08 DIAGNOSIS — M5137 Other intervertebral disc degeneration, lumbosacral region: Secondary | ICD-10-CM | POA: Diagnosis not present

## 2013-02-10 ENCOUNTER — Other Ambulatory Visit (HOSPITAL_COMMUNITY): Payer: Self-pay

## 2013-02-11 DIAGNOSIS — M5137 Other intervertebral disc degeneration, lumbosacral region: Secondary | ICD-10-CM | POA: Diagnosis not present

## 2013-02-11 DIAGNOSIS — M999 Biomechanical lesion, unspecified: Secondary | ICD-10-CM | POA: Diagnosis not present

## 2013-02-12 ENCOUNTER — Other Ambulatory Visit (HOSPITAL_COMMUNITY): Payer: Self-pay

## 2013-02-12 DIAGNOSIS — N401 Enlarged prostate with lower urinary tract symptoms: Secondary | ICD-10-CM | POA: Diagnosis not present

## 2013-02-15 ENCOUNTER — Other Ambulatory Visit (HOSPITAL_COMMUNITY): Payer: Self-pay

## 2013-02-16 DIAGNOSIS — M999 Biomechanical lesion, unspecified: Secondary | ICD-10-CM | POA: Diagnosis not present

## 2013-02-16 DIAGNOSIS — M5137 Other intervertebral disc degeneration, lumbosacral region: Secondary | ICD-10-CM | POA: Diagnosis not present

## 2013-02-17 ENCOUNTER — Other Ambulatory Visit (HOSPITAL_COMMUNITY): Payer: Self-pay

## 2013-02-19 ENCOUNTER — Other Ambulatory Visit (HOSPITAL_COMMUNITY): Payer: Self-pay

## 2013-02-24 ENCOUNTER — Other Ambulatory Visit (HOSPITAL_COMMUNITY): Payer: Self-pay

## 2013-02-26 ENCOUNTER — Other Ambulatory Visit (HOSPITAL_COMMUNITY): Payer: Self-pay

## 2013-03-01 ENCOUNTER — Other Ambulatory Visit (HOSPITAL_COMMUNITY): Payer: Self-pay

## 2013-03-03 ENCOUNTER — Other Ambulatory Visit (HOSPITAL_COMMUNITY): Payer: Self-pay

## 2013-03-03 ENCOUNTER — Ambulatory Visit: Payer: Medicare Other | Admitting: Podiatry

## 2013-03-05 ENCOUNTER — Other Ambulatory Visit (HOSPITAL_COMMUNITY): Payer: Self-pay

## 2013-03-08 ENCOUNTER — Other Ambulatory Visit (HOSPITAL_COMMUNITY): Payer: Self-pay

## 2013-03-10 ENCOUNTER — Other Ambulatory Visit (HOSPITAL_COMMUNITY): Payer: Self-pay

## 2013-03-10 ENCOUNTER — Ambulatory Visit (INDEPENDENT_AMBULATORY_CARE_PROVIDER_SITE_OTHER): Payer: Medicare Other | Admitting: Podiatry

## 2013-03-10 ENCOUNTER — Encounter: Payer: Self-pay | Admitting: Podiatry

## 2013-03-10 VITALS — BP 125/82 | HR 71 | Ht 63.0 in | Wt 135.0 lb

## 2013-03-10 DIAGNOSIS — B351 Tinea unguium: Secondary | ICD-10-CM | POA: Diagnosis not present

## 2013-03-10 DIAGNOSIS — M25579 Pain in unspecified ankle and joints of unspecified foot: Secondary | ICD-10-CM

## 2013-03-10 NOTE — Progress Notes (Signed)
Subjective: 72 y.o. year old male patient presents complaining of painful nails. Patient requests toe nails trimmed.   Objective: Dermatologic: Thick yellow deformed nails x 10. Vascular: Pedal pulses are all palpable. Orthopedic: Rectus foot without gross deformities.  Neurologic: All epicritic and tactile sensations grossly intact.  Assessment: Dystrophic mycotic nails x 10.  Treatment: All mycotic nails debrided.  Return in 3 months or as needed.

## 2013-03-10 NOTE — Patient Instructions (Addendum)
Seen for hypertrophic nails. Debrided all nails. Make sure keep in between digits clean and dry. Return in 3 months or as needed.   

## 2013-03-12 ENCOUNTER — Other Ambulatory Visit (HOSPITAL_COMMUNITY): Payer: Self-pay

## 2013-03-15 ENCOUNTER — Other Ambulatory Visit (HOSPITAL_COMMUNITY): Payer: Self-pay

## 2013-03-17 DIAGNOSIS — Z23 Encounter for immunization: Secondary | ICD-10-CM | POA: Diagnosis not present

## 2013-06-09 ENCOUNTER — Ambulatory Visit: Payer: Medicare Other | Admitting: Podiatry

## 2013-08-20 DIAGNOSIS — N401 Enlarged prostate with lower urinary tract symptoms: Secondary | ICD-10-CM | POA: Diagnosis not present

## 2013-08-20 DIAGNOSIS — N139 Obstructive and reflux uropathy, unspecified: Secondary | ICD-10-CM | POA: Diagnosis not present

## 2013-11-26 ENCOUNTER — Encounter: Payer: Self-pay | Admitting: Nurse Practitioner

## 2013-11-26 ENCOUNTER — Encounter (INDEPENDENT_AMBULATORY_CARE_PROVIDER_SITE_OTHER): Payer: Self-pay

## 2013-11-26 ENCOUNTER — Ambulatory Visit (INDEPENDENT_AMBULATORY_CARE_PROVIDER_SITE_OTHER): Payer: Medicare Other | Admitting: Nurse Practitioner

## 2013-11-26 VITALS — BP 127/76 | HR 61 | Ht 62.5 in | Wt 143.0 lb

## 2013-11-26 DIAGNOSIS — R569 Unspecified convulsions: Secondary | ICD-10-CM

## 2013-11-26 MED ORDER — LEVETIRACETAM 500 MG PO TABS: 500.0000 mg | ORAL_TABLET | Freq: Two times a day (BID) | ORAL | Status: DC

## 2013-11-26 NOTE — Progress Notes (Signed)
I have read the note, and I agree with the clinical assessment and plan.  Khary Schaben K Okechukwu Regnier   
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
## 2013-11-26 NOTE — Patient Instructions (Signed)
Continue Keppra at current dose will refill for one year. Call for any seizure activity Follow-up yearly and when necessary 

## 2014-01-19 DIAGNOSIS — F4323 Adjustment disorder with mixed anxiety and depressed mood: Secondary | ICD-10-CM | POA: Diagnosis not present

## 2014-01-27 DIAGNOSIS — F4323 Adjustment disorder with mixed anxiety and depressed mood: Secondary | ICD-10-CM | POA: Diagnosis not present

## 2014-02-09 DIAGNOSIS — F4323 Adjustment disorder with mixed anxiety and depressed mood: Secondary | ICD-10-CM | POA: Diagnosis not present

## 2014-02-18 DIAGNOSIS — N401 Enlarged prostate with lower urinary tract symptoms: Secondary | ICD-10-CM | POA: Diagnosis not present

## 2014-02-18 DIAGNOSIS — N139 Obstructive and reflux uropathy, unspecified: Secondary | ICD-10-CM | POA: Diagnosis not present

## 2014-02-22 DIAGNOSIS — J370 Chronic laryngitis: Secondary | ICD-10-CM | POA: Diagnosis not present

## 2014-02-22 DIAGNOSIS — J309 Allergic rhinitis, unspecified: Secondary | ICD-10-CM | POA: Diagnosis not present

## 2014-02-23 DIAGNOSIS — Z6825 Body mass index (BMI) 25.0-25.9, adult: Secondary | ICD-10-CM | POA: Diagnosis not present

## 2014-02-23 DIAGNOSIS — IMO0002 Reserved for concepts with insufficient information to code with codable children: Secondary | ICD-10-CM | POA: Diagnosis not present

## 2014-02-23 DIAGNOSIS — F4323 Adjustment disorder with mixed anxiety and depressed mood: Secondary | ICD-10-CM | POA: Diagnosis not present

## 2014-02-24 ENCOUNTER — Other Ambulatory Visit: Payer: Self-pay | Admitting: Neurological Surgery

## 2014-02-24 DIAGNOSIS — M5416 Radiculopathy, lumbar region: Secondary | ICD-10-CM

## 2014-03-02 ENCOUNTER — Ambulatory Visit
Admission: RE | Admit: 2014-03-02 | Discharge: 2014-03-02 | Disposition: A | Discharge status: Home / Self Care | Payer: Medicare Other | Source: Ambulatory Visit | Attending: Neurological Surgery | Admitting: Neurological Surgery

## 2014-03-02 DIAGNOSIS — M48061 Spinal stenosis, lumbar region without neurogenic claudication: Secondary | ICD-10-CM | POA: Diagnosis not present

## 2014-03-02 DIAGNOSIS — M5126 Other intervertebral disc displacement, lumbar region: Secondary | ICD-10-CM | POA: Diagnosis not present

## 2014-03-02 DIAGNOSIS — M47817 Spondylosis without myelopathy or radiculopathy, lumbosacral region: Secondary | ICD-10-CM | POA: Diagnosis not present

## 2014-03-02 DIAGNOSIS — M5416 Radiculopathy, lumbar region: Secondary | ICD-10-CM

## 2014-03-02 DIAGNOSIS — M5137 Other intervertebral disc degeneration, lumbosacral region: Secondary | ICD-10-CM | POA: Diagnosis not present

## 2014-03-10 DIAGNOSIS — M25559 Pain in unspecified hip: Secondary | ICD-10-CM | POA: Diagnosis not present

## 2014-03-16 DIAGNOSIS — F4323 Adjustment disorder with mixed anxiety and depressed mood: Secondary | ICD-10-CM | POA: Diagnosis not present

## 2014-04-06 DIAGNOSIS — F4323 Adjustment disorder with mixed anxiety and depressed mood: Secondary | ICD-10-CM | POA: Diagnosis not present

## 2014-04-13 DIAGNOSIS — Z23 Encounter for immunization: Secondary | ICD-10-CM | POA: Diagnosis not present

## 2014-04-19 DIAGNOSIS — F4323 Adjustment disorder with mixed anxiety and depressed mood: Secondary | ICD-10-CM | POA: Diagnosis not present

## 2014-04-27 DIAGNOSIS — M25552 Pain in left hip: Secondary | ICD-10-CM | POA: Diagnosis not present

## 2014-05-05 DIAGNOSIS — M5136 Other intervertebral disc degeneration, lumbar region: Secondary | ICD-10-CM | POA: Diagnosis not present

## 2014-05-10 DIAGNOSIS — F4323 Adjustment disorder with mixed anxiety and depressed mood: Secondary | ICD-10-CM | POA: Diagnosis not present

## 2014-05-10 DIAGNOSIS — M5136 Other intervertebral disc degeneration, lumbar region: Secondary | ICD-10-CM | POA: Diagnosis not present

## 2014-05-11 ENCOUNTER — Encounter: Payer: Self-pay | Admitting: Neurology

## 2014-05-13 DIAGNOSIS — M5136 Other intervertebral disc degeneration, lumbar region: Secondary | ICD-10-CM | POA: Diagnosis not present

## 2014-05-17 ENCOUNTER — Encounter: Payer: Self-pay | Admitting: Neurology

## 2014-05-17 ENCOUNTER — Encounter: Payer: Self-pay | Admitting: Nurse Practitioner

## 2014-05-17 DIAGNOSIS — M5136 Other intervertebral disc degeneration, lumbar region: Secondary | ICD-10-CM | POA: Diagnosis not present

## 2014-05-24 DIAGNOSIS — M5136 Other intervertebral disc degeneration, lumbar region: Secondary | ICD-10-CM | POA: Diagnosis not present

## 2014-05-26 DIAGNOSIS — M5136 Other intervertebral disc degeneration, lumbar region: Secondary | ICD-10-CM | POA: Diagnosis not present

## 2014-05-31 DIAGNOSIS — M5136 Other intervertebral disc degeneration, lumbar region: Secondary | ICD-10-CM | POA: Diagnosis not present

## 2014-06-02 DIAGNOSIS — M5136 Other intervertebral disc degeneration, lumbar region: Secondary | ICD-10-CM | POA: Diagnosis not present

## 2014-06-29 DIAGNOSIS — E1165 Type 2 diabetes mellitus with hyperglycemia: Secondary | ICD-10-CM | POA: Diagnosis not present

## 2014-06-29 DIAGNOSIS — E559 Vitamin D deficiency, unspecified: Secondary | ICD-10-CM | POA: Diagnosis not present

## 2014-06-29 DIAGNOSIS — R569 Unspecified convulsions: Secondary | ICD-10-CM | POA: Diagnosis not present

## 2014-06-29 DIAGNOSIS — Z23 Encounter for immunization: Secondary | ICD-10-CM | POA: Diagnosis not present

## 2014-06-29 DIAGNOSIS — M5411 Radiculopathy, occipito-atlanto-axial region: Secondary | ICD-10-CM | POA: Diagnosis not present

## 2014-06-29 DIAGNOSIS — F4320 Adjustment disorder, unspecified: Secondary | ICD-10-CM | POA: Diagnosis not present

## 2014-06-29 DIAGNOSIS — N400 Enlarged prostate without lower urinary tract symptoms: Secondary | ICD-10-CM | POA: Diagnosis not present

## 2014-06-29 DIAGNOSIS — E780 Pure hypercholesterolemia: Secondary | ICD-10-CM | POA: Diagnosis not present

## 2014-06-29 DIAGNOSIS — M169 Osteoarthritis of hip, unspecified: Secondary | ICD-10-CM | POA: Diagnosis not present

## 2014-06-29 DIAGNOSIS — I10 Essential (primary) hypertension: Secondary | ICD-10-CM | POA: Diagnosis not present

## 2014-06-29 DIAGNOSIS — Z79899 Other long term (current) drug therapy: Secondary | ICD-10-CM | POA: Diagnosis not present

## 2014-07-27 ENCOUNTER — Emergency Department (HOSPITAL_COMMUNITY)
Admission: EM | Admit: 2014-07-27 | Discharge: 2014-07-28 | Disposition: A | Discharge status: Home / Self Care | Payer: Medicare Other | Attending: Emergency Medicine | Admitting: Emergency Medicine

## 2014-07-27 ENCOUNTER — Encounter (HOSPITAL_COMMUNITY): Payer: Self-pay | Admitting: *Deleted

## 2014-07-27 DIAGNOSIS — Z9981 Dependence on supplemental oxygen: Secondary | ICD-10-CM | POA: Diagnosis not present

## 2014-07-27 DIAGNOSIS — Z87891 Personal history of nicotine dependence: Secondary | ICD-10-CM | POA: Diagnosis not present

## 2014-07-27 DIAGNOSIS — F10920 Alcohol use, unspecified with intoxication, uncomplicated: Secondary | ICD-10-CM

## 2014-07-27 DIAGNOSIS — R000 Tachycardia, unspecified: Secondary | ICD-10-CM | POA: Diagnosis not present

## 2014-07-27 DIAGNOSIS — Y908 Blood alcohol level of 240 mg/100 ml or more: Secondary | ICD-10-CM | POA: Insufficient documentation

## 2014-07-27 DIAGNOSIS — Z79899 Other long term (current) drug therapy: Secondary | ICD-10-CM | POA: Diagnosis not present

## 2014-07-27 DIAGNOSIS — I10 Essential (primary) hypertension: Secondary | ICD-10-CM | POA: Insufficient documentation

## 2014-07-27 DIAGNOSIS — Z8739 Personal history of other diseases of the musculoskeletal system and connective tissue: Secondary | ICD-10-CM | POA: Insufficient documentation

## 2014-07-27 DIAGNOSIS — E119 Type 2 diabetes mellitus without complications: Secondary | ICD-10-CM | POA: Diagnosis not present

## 2014-07-27 DIAGNOSIS — G40909 Epilepsy, unspecified, not intractable, without status epilepticus: Secondary | ICD-10-CM | POA: Insufficient documentation

## 2014-07-27 DIAGNOSIS — F10120 Alcohol abuse with intoxication, uncomplicated: Secondary | ICD-10-CM | POA: Insufficient documentation

## 2014-07-27 DIAGNOSIS — G4733 Obstructive sleep apnea (adult) (pediatric): Secondary | ICD-10-CM | POA: Diagnosis not present

## 2014-07-27 DIAGNOSIS — F10129 Alcohol abuse with intoxication, unspecified: Secondary | ICD-10-CM | POA: Diagnosis present

## 2014-07-27 LAB — URINALYSIS, ROUTINE W REFLEX MICROSCOPIC
Bilirubin Urine: NEGATIVE
Glucose, UA: NEGATIVE mg/dL
Hgb urine dipstick: NEGATIVE
Ketones, ur: NEGATIVE mg/dL
Leukocytes, UA: NEGATIVE
Nitrite: NEGATIVE
Protein, ur: NEGATIVE mg/dL
Specific Gravity, Urine: 1.005 (ref 1.005–1.030)
Urobilinogen, UA: 0.2 mg/dL (ref 0.0–1.0)
pH: 7 (ref 5.0–8.0)

## 2014-07-27 LAB — COMPREHENSIVE METABOLIC PANEL
ALK PHOS: 92 U/L (ref 39–117)
ALT: 45 U/L (ref 0–53)
ANION GAP: 12 (ref 5–15)
AST: 77 U/L — AB (ref 0–37)
Albumin: 4.1 g/dL (ref 3.5–5.2)
BUN: 10 mg/dL (ref 6–23)
CALCIUM: 9.6 mg/dL (ref 8.4–10.5)
CHLORIDE: 100 mmol/L (ref 96–112)
CO2: 27 mmol/L (ref 19–32)
Creatinine, Ser: 0.77 mg/dL (ref 0.50–1.35)
GFR calc Af Amer: >90 mL/min (ref >90)
GFR calc non Af Amer: 89 mL/min — ABNORMAL LOW (ref >90)
Glucose, Bld: 170 mg/dL — ABNORMAL HIGH (ref 70–99)
POTASSIUM: 4.8 mmol/L (ref 3.5–5.1)
Sodium: 139 mmol/L (ref 135–145)
Total Bilirubin: 0.6 mg/dL (ref 0.3–1.2)
Total Protein: 8.4 g/dL — ABNORMAL HIGH (ref 6.0–8.3)

## 2014-07-27 LAB — CBC WITH DIFFERENTIAL/PLATELET
Basophils Absolute: 0 10*3/uL (ref 0.0–0.1)
Basophils Relative: 1 % (ref 0–1)
EOS ABS: 0.1 10*3/uL (ref 0.0–0.7)
Eosinophils Relative: 1 % (ref 0–5)
HEMATOCRIT: 44.5 % (ref 39.0–52.0)
HEMOGLOBIN: 15.4 g/dL (ref 13.0–17.0)
LYMPHS ABS: 2.1 10*3/uL (ref 0.7–4.0)
Lymphocytes Relative: 36 % (ref 12–46)
MCH: 33.5 pg (ref 26.0–34.0)
MCHC: 34.6 g/dL (ref 30.0–36.0)
MCV: 96.7 fL (ref 78.0–100.0)
MONO ABS: 0.7 10*3/uL (ref 0.1–1.0)
MONOS PCT: 12 % (ref 3–12)
NEUTROS ABS: 2.9 10*3/uL (ref 1.7–7.7)
Neutrophils Relative %: 50 % (ref 43–77)
Platelets: 153 10*3/uL (ref 150–400)
RBC: 4.6 MIL/uL (ref 4.22–5.81)
RDW: 12.9 % (ref 11.5–15.5)
WBC: 5.7 10*3/uL (ref 4.0–10.5)

## 2014-07-27 LAB — ETHANOL: ALCOHOL ETHYL (B): 417 mg/dL — AB (ref 0–9)

## 2014-07-27 LAB — RAPID URINE DRUG SCREEN, HOSP PERFORMED
Amphetamines: NOT DETECTED
Barbiturates: NOT DETECTED
Benzodiazepines: NOT DETECTED
Cocaine: NOT DETECTED
Opiates: NOT DETECTED
Tetrahydrocannabinol: NOT DETECTED

## 2014-07-27 NOTE — ED Notes (Signed)
Family at bedside. 

## 2014-07-27 NOTE — ED Notes (Signed)
The pts wife reports that he has not been acting right since this am.   He is an alcoholic and his wife reports that the pt will not admit it.  The pt last had a drink of alcohol 3 days ago.  The pt is not eating or sleeping well.  His wife is concerned

## 2014-07-27 NOTE — ED Provider Notes (Signed)
CSN: [id number]     Arrival date & time 07/27/14  1852 History   First MD Initiated Contact with Patient 07/27/14 2103     Chief Complaint  Patient presents with  . not acting right      (Consider location/radiation/quality/duration/timing/severity/associated sxs/prior Treatment) HPI 74 year old male past history of hypertension, diabetes, seizures, chronic alcoholism who presents to ED with wife who states patient is not acting right. Wife states patient has not been eating and she is concerned about his alcohol use. Patient reports his last alcohol use was 3 days ago. He states he typically drinks 3 or 4 vodka drinks daily. Patient denies having any symptoms currently aside from having no appetite and feeling fatigued. He denies recent illness, nausea, vomiting, confusion, headache, abdominal pain.    Past Medical History  Diagnosis Date  . Hypertension   . Arthritis   . Seizures     first12/12-none since  . Diabetes mellitus     type 2-no meds  . Sleep apnea     nasal CPAP at HS  . Glaucoma   . Low testosterone    Past Surgical History  Procedure Laterality Date  . Hemrhoidectomy  1988  . Colonoscopy    . Mass excision  10/07/2011    Procedure: EXCISION MASS;  Surgeon: Cristine Polio, MD;  Location: [facility];  Service: Plastics;  Laterality: Left;  excision of large mass on left back with possible lipo assistence  . Liposuction  10/07/2011    Procedure: LIPOSUCTION;  Surgeon: Cristine Polio, MD;  Location: [facility];  Service: Plastics;  Laterality: Left;   Family History  Problem Relation Age of Onset  . Alcohol abuse Father   . Alcohol abuse Paternal Grandfather    History  Substance Use Topics  . Smoking status: Former Smoker    Quit date: 10/03/1966  . Smokeless tobacco: Never Used     Comment: quit smoking 1971  . Alcohol Use: 12.6 oz/week    21 Shots of liquor per week     Comment: occ    Review of Systems   Constitutional: Positive for appetite change and fatigue. Negative for fever and activity change.  HENT: Negative for congestion, rhinorrhea and sore throat.   Eyes: Negative for visual disturbance.  Respiratory: Negative for cough and shortness of breath.   Cardiovascular: Negative for chest pain, palpitations and leg swelling.  Gastrointestinal: Negative for nausea, vomiting, abdominal pain and diarrhea.  Genitourinary: Negative for dysuria, flank pain, decreased urine volume and difficulty urinating.  Musculoskeletal: Negative for back pain and neck pain.  Skin: Negative for rash.  Neurological: Negative for dizziness, syncope, speech difficulty, weakness, light-headedness, numbness and headaches.  Psychiatric/Behavioral: Negative for confusion.      Allergies  Review of patient's allergies indicates no known allergies.  Home Medications   Prior to Admission medications   Medication Sig Start Date End Date Taking? Authorizing Provider  Cyanocobalamin (VITAMIN B 12 PO) Take 1 tablet by mouth daily.      Historical Provider, MD  levETIRAcetam (KEPPRA) 500 MG tablet Take 1 tablet (500 mg total) by mouth 2 (two) times daily. 11/26/13   Dennie Bible, NP  metoprolol tartrate (LOPRESSOR) 25 MG tablet Take 25 mg by mouth daily.    Historical Provider, MD  Milk Thistle 1000 MG CAPS Take 1 tablet by mouth daily.    Historical Provider, MD  Multiple Vitamin (MULTIVITAMIN WITH MINERALS) TABS Take 1 tablet by mouth daily.    Historical  Provider, MD  PREVIDENT 5000 BOOSTER PLUS 1.1 % PSTE  11/24/13   Historical Provider, MD  saw palmetto 160 MG capsule Take 160 mg by mouth 2 (two) times daily.    Historical Provider, MD  Tamsulosin HCl (FLOMAX) 0.4 MG CAPS Take 0.4 mg by mouth daily.    Historical Provider, MD  testosterone (ANDROGEL) 50 MG/5GM GEL Place 5 g onto the skin daily.    Historical Provider, MD  vitamin E 100 UNIT capsule Take 100 Units by mouth daily.    Historical Provider, MD    BP 123/78 mmHg  Pulse 104  Temp(Src) 98.4 F (36.9 C)  Resp 20  SpO2 95% Physical Exam  Constitutional: He is oriented to person, place, and time. He appears well-developed and well-nourished. No distress.  HENT:  Head: Normocephalic and atraumatic.  Nose: Nose normal.  Mouth/Throat: Oropharynx is clear and moist. No oropharyngeal exudate.  Eyes: Conjunctivae and EOM are normal.  Neck: Normal range of motion. Neck supple. No JVD present.  Cardiovascular: Regular rhythm, normal heart sounds and intact distal pulses.  Tachycardia present.   Pulmonary/Chest: Effort normal and breath sounds normal. No respiratory distress.  Abdominal: Soft. He exhibits no distension. There is no tenderness. There is no rebound and no guarding.  Musculoskeletal: Normal range of motion.  Neurological: He is alert and oriented to person, place, and time. No cranial nerve deficit.  HDS, AAOx4. PERRL, EOMI, TML, face sym. CN 2-12 grossly intact. 5/5 sym, no drift, SILT, normal gait and coordination.    Skin: Skin is warm and dry. No rash noted.  Psychiatric: He has a normal mood and affect.  Nursing note and vitals reviewed.   ED Course  Procedures (including critical care time) Labs Review Labs Reviewed  COMPREHENSIVE METABOLIC PANEL - Abnormal; Notable for the following:    Glucose, Bld 170 (*)    Total Protein 8.4 (*)    AST 77 (*)    GFR calc non Af Amer 89 (*)    All other components within normal limits  ETHANOL - Abnormal; Notable for the following:    Alcohol, Ethyl (B) 417 (*)    All other components within normal limits  CBC WITH DIFFERENTIAL/PLATELET  URINALYSIS, ROUTINE W REFLEX MICROSCOPIC  URINE RAPID DRUG SCREEN (HOSP PERFORMED)    Imaging Review No results found.   EKG Interpretation None      MDM   Final diagnoses:  None    Annie Roseboom is a 74 y.o. male with H&P as above. Patient arrives hemodynamically stable in no apparent distress. Wife concerned about  patient's alcohol use and poor appetite. Patient denies having alcohol for 3 days however his EtOH in the ED is 417. Patient states he absolutely did not have any alcohol today. Patient has memory was tested and otherwise appears to be within normal limits and his wife verifies. Patient has a benign neuro exam at this time without nystagmus, incoordination, ataxia, memory impairment. Patient has a benign abdominal exam. Screening labs otherwise unremarkable. Had lengthy discussion with patient and his wife regarding his alcohol abuse. Advised close PCP f/u to discuss this problem. Also, provided pt resource handout. Pt signed out to Dr. Lita Mains at 0100. Pt is metabolizing until morning until ETOH improved. Wife to return in AM to pick up.   Pt seen in conjunction with Dr. Mirna Mires, MD  Kirstie Peri, [facility] Resident - PGY-2    Kirstie Peri, MD 07/28/14 3903  Mirna Mires, MD  07/29/14 0830 

## 2014-07-28 NOTE — Discharge Instructions (Signed)
°Emergency Department Resource Guide °1) Find a Doctor and Pay Out of Pocket °Although you won't have to find out who is covered by your insurance plan, it is a good idea to ask around and get recommendations. You will then need to call the office and see if the doctor you have chosen will accept you as a new patient and what types of options they offer for patients who are self-pay. Some doctors offer discounts or will set up payment plans for their patients who do not have insurance, but you will need to ask so you aren't surprised when you get to your appointment. ° °2) Contact Your Local Health Department °Not all health departments have doctors that can see patients for sick visits, but many do, so it is worth a call to see if yours does. If you don't know where your local health department is, you can check in your phone book. The CDC also has a tool to help you locate your state's health department, and many state websites also have listings of all of their local health departments. ° °3) Find a Walk-in Clinic °If your illness is not likely to be very severe or complicated, you may want to try a walk in clinic. These are popping up all over the country in pharmacies, drugstores, and shopping centers. They're usually staffed by nurse practitioners or physician assistants that have been trained to treat common illnesses and complaints. They're usually fairly quick and inexpensive. However, if you have serious medical issues or chronic medical problems, these are probably not your best option. ° °No Primary Care Doctor: °- Call [facility] at  [phone] - they can help you locate a primary care doctor that  accepts your insurance, provides certain services, etc. °- Physician Referral Service- 1-800-533-3463 ° °Chronic Pain Problems: °Organization         Address  Phone   Notes  °[facility]  (336) [phone] Patients need to be referred by their primary care doctor.  ° °Medication  Assistance: °Organization         Address  Phone   Notes  °[place] [facility] [address] °[place], [place] [address] (336) 641-8030 --Must be a resident of [place] °-- Must have NO insurance coverage whatsoever (no Medicaid/ Medicare, etc.) °-- The pt. MUST have a primary care doctor that directs their care regularly and follows them in the community °  °[facility]  (866) 331-1348   °[employer]  (888) 892-1162   ° °Agencies that provide inexpensive medical care: °Organization         Address  Phone   Notes  °[facility]  (336) 832-8035   °[facility]    (336) 832-7272   °[facility] [address] °[place], [place] [address] (336) 832-4777   °[facility] [address], °[place] (336) 271-4999   °[facility]    (336) 373-0678   °[facility]    (336) 272-1050   °[facility] ° [address], [place] Phone:  (336) 832-4444, Fax:  (336) 832-4440 Hours of Operation:  9 am - 6 pm, M-F.  Also accepts Medicaid/Medicare and self-pay.  °[facility] ° [address], [place] Phone: (336) [phone], Fax: (336) [phone]. Hours of Operation:  8:30 am - 5:30 pm, M-F.  Also accepts Medicaid and self-pay.  °[facility] [place] [address], [place] Phone: (336) 878-6027   °[facility] [address], [place], [place] (336)723-1848, Ext. 123 Mondays & Thursdays: 7-9 AM.  First 15 patients are seen on a first come, first serve basis. °  ° °Medicaid-accepting [place] Providers: ° °Organization         Address  Phone   Notes  °[facility] [address], [place] (336) 641-2100 Also accepts self-pay patients.  °[facility] [address], [place] ° (336) [phone]   °[facility] [address], [place]  (336) 288-8857   °[facility] [address] [place] [address], [place] (336) 299-7000   °Veita Bland [address], [place]  ° (336) 373-1557 Only accepts [place] Access Medicaid patients after they have their name applied to their card.  ° °Self-Pay (no insurance) in [place]: ° °Organization         Address  Phone   Notes  °Sickle Cell Patients, [facility] [address], [place] (336) 832-1970   °[facility] [address], [place] (336) 832-4400   °[facility] ° [address], [place] (336) 992-4800   °[facility]/Dr. Osei-Bonsu ° [address] [place] [address], [place] or [address], [place] (336) [phone] Phone number for both [place] and [place] locations is the same.  °[facility] [address], [place] (336) 299-0000   °[facility] [address] [place] [address], [place] or [address] (336) 852-7530 °(336) 878-2260   °[facility] [address], [place] (336) 350-1642, phone; (336) 294-5005, fax Sees patients 1st and 3rd Saturday of every month.  Must not qualify for public or private insurance (i.e. Medicaid, Medicare, [place] Health Choice, Veterans' Benefits) • Household income should be no more than 200% of the poverty level •The clinic cannot treat you if you are pregnant or think you are pregnant • Sexually transmitted diseases are not treated at the clinic.  ° ° °Dental Care: °Organization         Address  Phone  Notes  °[place] [facility] [address], [place] (336) 641-6152 Accepts children up to age 21 who are enrolled in Medicaid or [place] Health Choice; pregnant women with a Medicaid card; and children who have applied for Medicaid or [place] Health Choice, but were declined, whose parents can pay a reduced fee at time of service.  °[place]  [facility] [place]  [address], [place] (336) 641-7733 Accepts children up to age 21 who are enrolled in Medicaid or [place] Health Choice; pregnant women with a Medicaid card; and children who have applied for Medicaid or [place] Health Choice, but were declined, whose parents can pay a reduced fee at time of service.  °[facility] ° [address], [place] (336) 641-4533 Patients are seen by appointment only. Walk-ins are not accepted. [facility] will see patients 18 years of age and older. °Monday - Tuesday (8am-5pm) °Most Wednesdays (8:30-5pm) °$30 per visit, cash only  °[facility] ° [address], [place] (336) [phone] Patients are seen by appointment only. Walk-ins are not accepted. [facility] will see patients 18 years of age and older. °One   Wednesday Evening (Monthly: Volunteer Based).  $30 per visit, cash only  °[facility]  (919) [phone] for adults; Children under age 4, call Graduate Pediatric Dentistry at ([phone]) [phone]. Children aged 4-14, please call ([phone]) [phone] to request a pediatric application. ° Dental services are provided in all areas of dental care including fillings, crowns and bridges, complete and partial dentures, implants, gum treatment, root canals, and extractions. Preventive care is also provided. Treatment is provided to both adults and children. °Patients are selected via a lottery and there is often a waiting list. °  °[facility] [address], °[place] ° (336) 763-8833 [email or web address] °  °[facility] [address], [place], [place] (336)723-1848, Ext. 123 Second and Fourth Thursday of each month, opens at 6:30 AM; Clinic ends at 9 AM.  Patients are seen on a first-come first-served basis, and a limited number are seen during each clinic.  ° °[facility] ° [address], [place], [place] (336) [phone]    Eligibility Requirements °You must have lived in [place], [place], or [place] counties for at least the last three months. °  You cannot be eligible for state or federal sponsored healthcare insurance, including Veterans Administration, Medicaid, or Medicare. °  You generally cannot be eligible for healthcare insurance through your employer.  °  How to apply: °Eligibility screenings are held every Tuesday and Wednesday afternoon from 1:00 pm until 4:00 pm. You do not need an appointment for the interview!  °[facility] [address], [place], [place] 336-631-2330   °[facility]  336-342-8273   °[facility]  336-703-3100   °[facility]  336-570-6415   ° °Behavioral Health Resources in the Community: °Intensive Outpatient Programs °Organization         Address  Phone  Notes  °[facility] [address], [place], [place] 336-878-6098   °[facility] [address], [place], [place] 336-832-9800   °[facility] [address], [place], [place] ° 336-882-2125   °[facility] [address],  °[place], [place] 1-800-853-5163 or 336-641-4981   °Substance Abuse Resources °Organization         Address  Phone  Notes  °[facility]  336-882-2125   °[facility]  336-784-9470   °[facility]  336-285-9073   °[facility]  336-845-3988   °Residential & Outpatient Substance Abuse Program  1-800-659-3381   °Psychological Services °Organization         Address  Phone  Notes  °[facility]  336- 832-9600   °[facility]  336- 378-7881   °[facility] [address], [place] 1-800-853-5163 or 336-641-4981   ° °Mobile Crisis Teams °Organization         Address  Phone  Notes  °[facility], Mobile Crisis Care Unit  1-877-626-1772   °Assertive °[facility] ° [address]  [place], [place] 336-834-9664   °Sharon DeEsch [address] °[place]  336-554-5454   ° °Self-Help/Support Groups °Organization         Address  Phone             Notes  °[facility] - variety of support groups  336- 373-1402 Call for more information  °Narcotics Anonymous (NA), [facility] [address], °[place]   2 meetings at this location  ° °  Residential Treatment Programs Organization         Address  Phone  Notes  [facility] [address]    [place]  1-812 146 0532   [facility]  [address] [place] [address], [place], [place]   [place], [place] 563-687-8938 Admissions: 8am-3pm M-F  [facility] [place] [address]    [place], [place] 664-403-4742   [facility] [address] [place], [place], [place]   [facility] [address],  [place], [place]   [facility] Intensive Outpatient [address], [place], [place]   [facility]  [place], [place] 1-726-405-0712 or (812)612-7693   [facility] [address] [place], [place] Accepts Medicaid  [facility] [address],  [place] 1-709-421-7834 Substance Abuse/Addiction Treatment   [facility]         Address  Phone  Notes  [facility]  910-292-7563   Domenic Schwab, PhD [address] [place], [place]   [phone] or [phone]   [facility] [place], [place] (986) 879-1575   [facility] [address] [place], [place] 925-346-4228 Insurance/Medicaid/sponsorship through [facility] and Families [address], [address] [place]                                    [place], [place] (636) 364-5575 [place] [address], [place] (708)702-9758    Dr. Adele Schilder  [phone]   [facility] Dept. 1) [address] [place] 2) [place] 3)  [address], [place] [phone]  3657412388   [place] 804-005-2220 or [phone] (After Hours)       Alcohol Intoxication Alcohol intoxication occurs when the amount of alcohol that a person has consumed impairs his or her ability to mentally and physically function. Alcohol directly impairs the normal chemical activity of the brain. Drinking large amounts of alcohol can lead to changes in mental function and behavior, and it can cause many physical effects that can be harmful.  Alcohol intoxication can range in severity from mild to very severe. Various factors can affect the level of intoxication that occurs, such as the person's age, gender, weight, frequency of alcohol consumption, and the presence of other medical conditions (such as diabetes, seizures, or heart conditions). Dangerous levels of alcohol intoxication may occur when people drink large amounts of alcohol in a short period (binge drinking). Alcohol can also be especially dangerous when combined with certain prescription medicines or "recreational" drugs. SIGNS AND SYMPTOMS Some common signs and symptoms of mild alcohol intoxication include:  Loss of coordination.  Changes in mood and behavior.  Impaired judgment.  Slurred speech. As alcohol intoxication progresses to more severe levels, other signs and symptoms will appear. These may include:  Vomiting.  Confusion and impaired memory.  Slowed breathing.  Seizures.  Loss of consciousness. DIAGNOSIS  Your health care provider will take a medical history and perform a physical exam. You will be asked about the amount and type of alcohol  you have consumed. Blood tests will  be done to measure the concentration of alcohol in your blood. In many places, your blood alcohol level must be lower than 80 mg/dL (0.08%) to legally drive. However, many dangerous effects of alcohol can occur at much lower levels.  TREATMENT  People with alcohol intoxication often do not require treatment. Most of the effects of alcohol intoxication are temporary, and they go away as the alcohol naturally leaves the body. Your health care provider will monitor your condition until you are stable enough to go home. Fluids are sometimes given through an IV access tube to help prevent dehydration.  HOME CARE INSTRUCTIONS  Do not drive after drinking alcohol.  Stay hydrated. Drink enough water and fluids to keep your urine clear or pale yellow. Avoid caffeine.   Only take over-the-counter or prescription medicines as directed by your health care provider.  SEEK MEDICAL CARE IF:   You have persistent vomiting.   You do not feel better after a few days.  You have frequent alcohol intoxication. Your health care provider can help determine if you should see a substance use treatment counselor. SEEK IMMEDIATE MEDICAL CARE IF:   You become shaky or tremble when you try to stop drinking.   You shake uncontrollably (seizure).   You throw up (vomit) blood. This may be bright red or may look like black coffee grounds.   You have blood in your stool. This may be bright red or may appear as a black, tarry, bad smelling stool.   You become lightheaded or faint.  MAKE SURE YOU:   Understand these instructions.  Will watch your condition.  Will get help right away if you are not doing well or get worse. Document Released: 03/20/2005 Document Revised: 02/10/2013 Document Reviewed: 11/13/2012 [facility] Patient Information 2015 [place], [place]. This information is not intended to replace advice given to you by your health care provider. Make sure you discuss any questions you have with your  health care provider.

## 2014-07-28 NOTE — ED Notes (Signed)
Patient is resting comfortably. 

## 2014-07-28 NOTE — ED Notes (Signed)
The patient was able to walk to the restroom with no problems.

## 2014-07-28 NOTE — ED Provider Notes (Signed)
Patient with history of chronic alcoholism presents with EtOH intoxication with level greater than 400. He has been observed overnight in the emergency department. He is ambulating without any difficulty. We'll discharge home with his wife. Precautions given.  Julianne Rice, MD 07/28/14 [phone]

## 2014-09-15 DIAGNOSIS — M1612 Unilateral primary osteoarthritis, left hip: Secondary | ICD-10-CM | POA: Diagnosis not present

## 2014-09-15 DIAGNOSIS — M19011 Primary osteoarthritis, right shoulder: Secondary | ICD-10-CM | POA: Diagnosis not present

## 2014-09-27 DIAGNOSIS — M7541 Impingement syndrome of right shoulder: Secondary | ICD-10-CM | POA: Diagnosis not present

## 2014-09-28 DIAGNOSIS — L570 Actinic keratosis: Secondary | ICD-10-CM | POA: Diagnosis not present

## 2014-09-29 DIAGNOSIS — L570 Actinic keratosis: Secondary | ICD-10-CM | POA: Diagnosis not present

## 2014-10-03 IMAGING — CT CT ORBITS W/O CM
3 series · 16 of 47 positions shown, 19 images · non-contrast
Comparison: Head CT 02/11/2012.

CLINICAL DATA: 71-year-old male with fall, left eye pain redness
and swelling.  Contusion.

CT ORBITS WITHOUT CONTRAST
TECHNIQUE: Multidetector CT imaging of the orbits was performed
following the standard protocol without intravenous contrast.

[Series 2: orbits · axial · 0.38mm/px · z∈[-2,+89]mm · 10 of 46 slices shown, 13 images]
[im 4/46  brain]
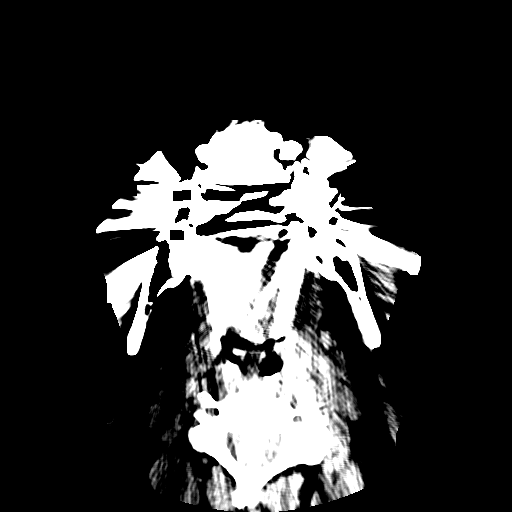
[im 4/46  bone]
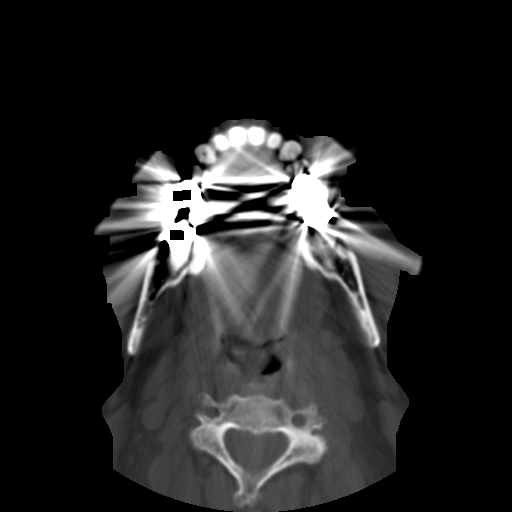
[im 8/46  bone]
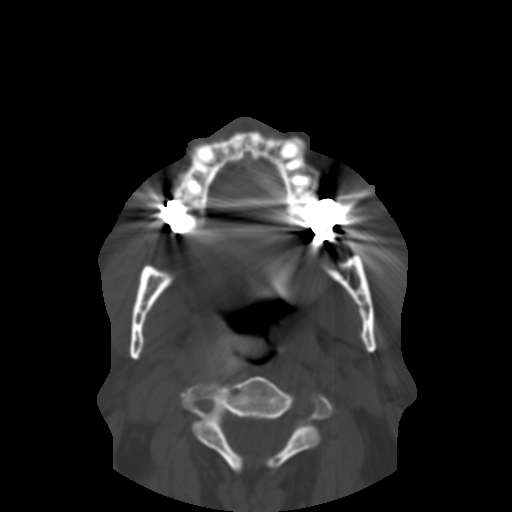
[im 13/46  bone]
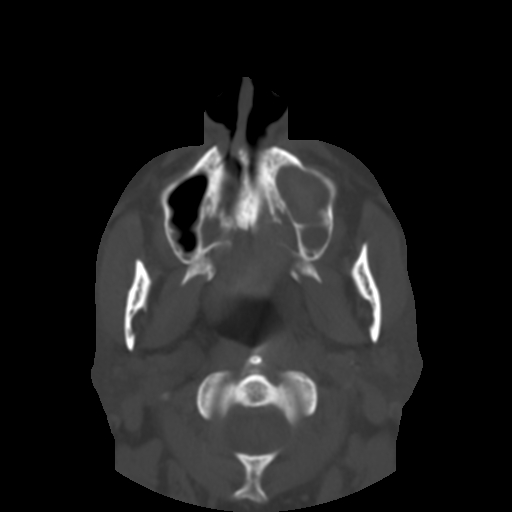
[im 16/46  bone]
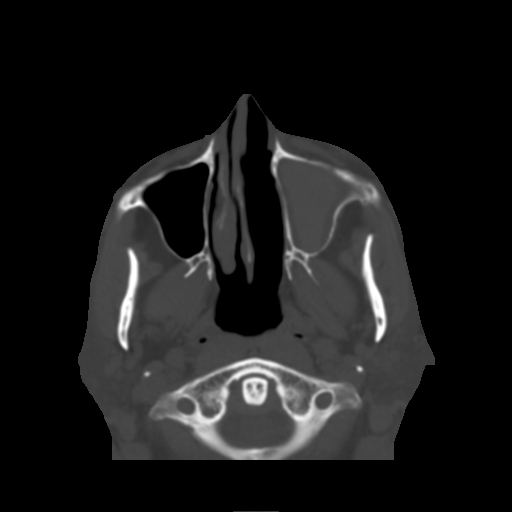
[im 21/46  brain]
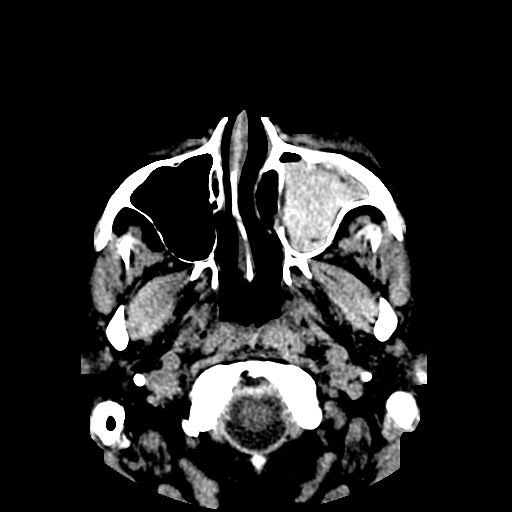
[im 21/46  bone]
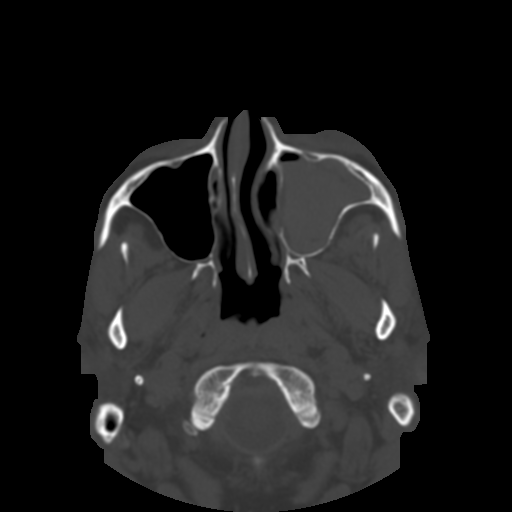
[im 25/46  bone]
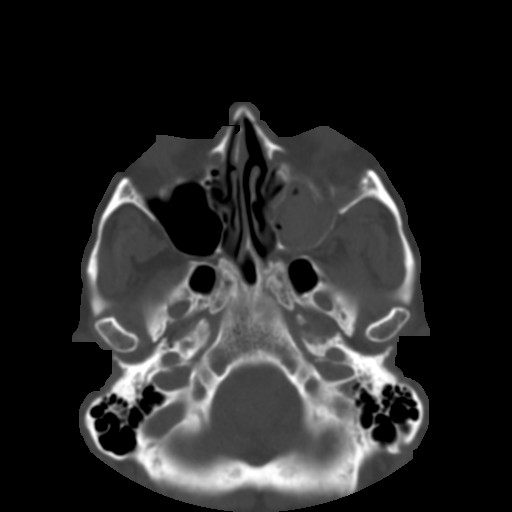
[im 30/46  bone]
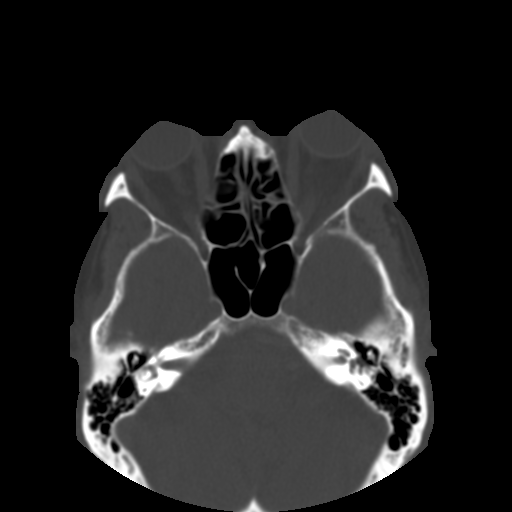
[im 35/46  bone]
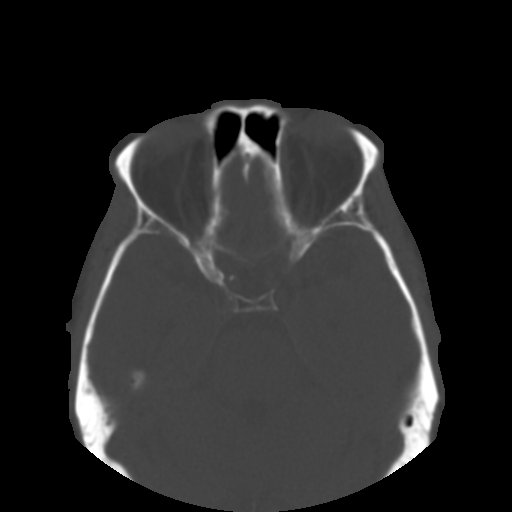
[im 38/46  brain]
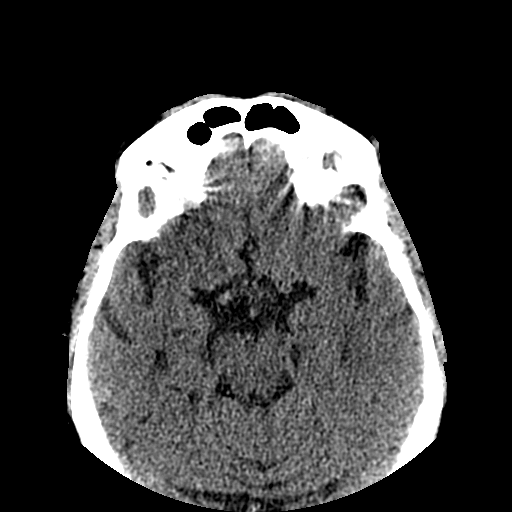
[im 38/46  bone]
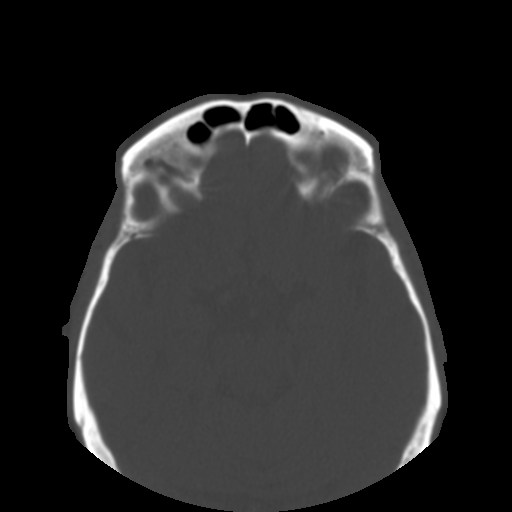
[im 42/46  bone]
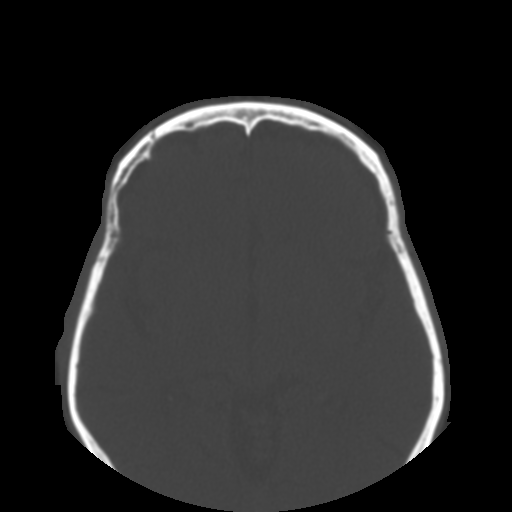

[Series 103: st sag · sagittal · 0.38mm/px · 3 of 84 slices shown]
[im 28/84  bone]
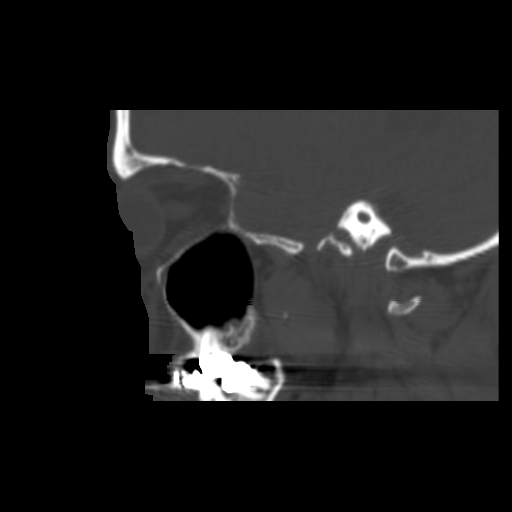
[im 42/84  bone]
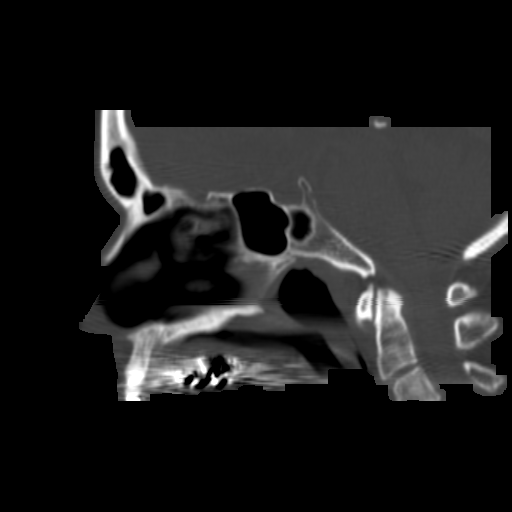
[im 56/84  bone]
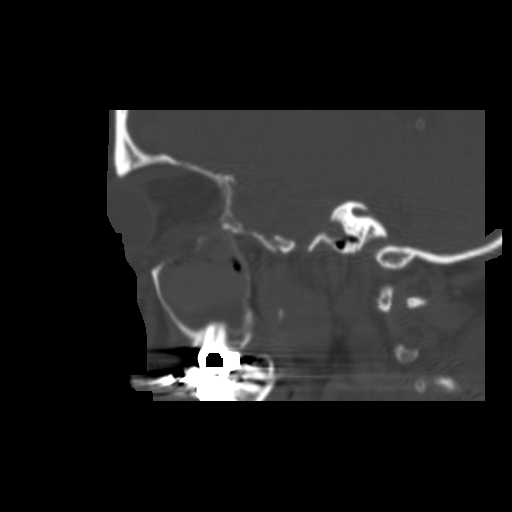

[Series 104: st cor · coronal · 0.38mm/px · 3 of 83 slices shown]
[im 28/83  bone]
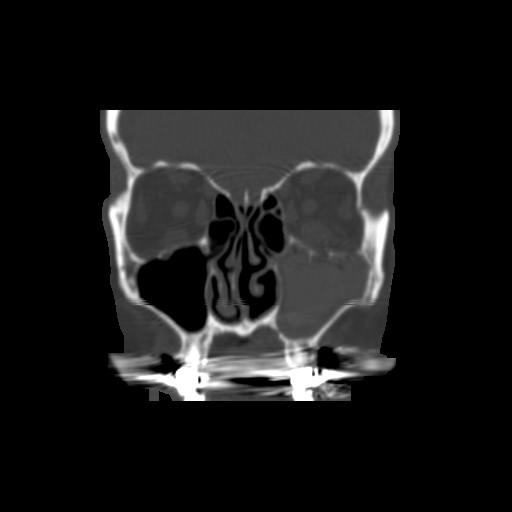
[im 37/83  bone]
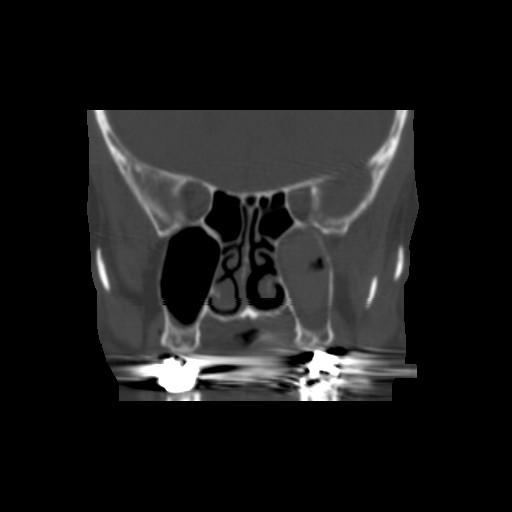
[im 46/83  bone]
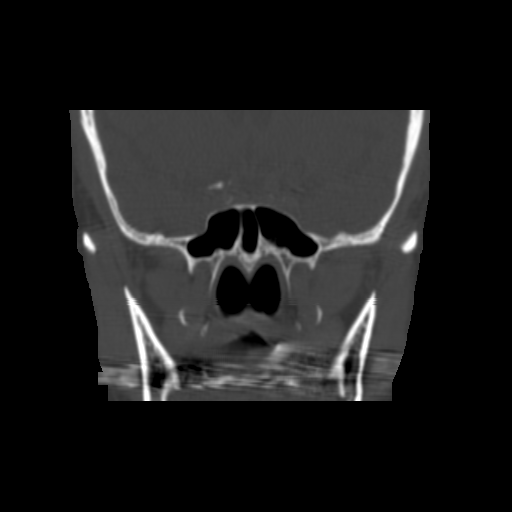

[16 of 47 positions shown; findings below may reference images not displayed]

FINDINGS: There may be scalp soft tissue swelling at the vertex
such as due to a scalp hematoma.  This area is not included on the
CT images.

Stable visualized noncontrast brain parenchyma.  Visualized deep
soft tissue spaces of the face are within normal limits.

Comminuted but only minimally-displaced fracture through the left
orbital floor.  Hemorrhage within the left maxillary sinus.  Mild
enlargement of the left inferior rectus muscle with mild adjacent
stranding likely is post traumatic.  No other retrobulbar are soft
tissue injury identified.  There may be mild left exophthalmos.
There is mild superficial left periorbital soft tissue swelling
compatible with soft tissue contusion.

The other walls of the left orbit appear to remain intact.  The
left zygoma appears intact.  No other acute face fracture
visualized.

Minimal left ethmoid sinus mucosal thickening.  Other paranasal
sinuses and mastoids are clear.
IMPRESSION: 1.  Comminuted minimally-displaced left orbital floor fracture
associated with blood in the left maxillary sinus and mild inferior
left antral orbital soft tissue contusion.
2.  Superficial left periorbital soft tissue contusion.  No
additional facial fracture identified.

## 2014-10-06 DIAGNOSIS — M7541 Impingement syndrome of right shoulder: Secondary | ICD-10-CM | POA: Diagnosis not present

## 2014-10-27 DIAGNOSIS — L570 Actinic keratosis: Secondary | ICD-10-CM | POA: Diagnosis not present

## 2014-11-14 DIAGNOSIS — H109 Unspecified conjunctivitis: Secondary | ICD-10-CM | POA: Diagnosis not present

## 2014-11-14 DIAGNOSIS — M25511 Pain in right shoulder: Secondary | ICD-10-CM | POA: Diagnosis not present

## 2014-11-14 DIAGNOSIS — J309 Allergic rhinitis, unspecified: Secondary | ICD-10-CM | POA: Diagnosis not present

## 2014-11-14 DIAGNOSIS — G40909 Epilepsy, unspecified, not intractable, without status epilepticus: Secondary | ICD-10-CM | POA: Diagnosis not present

## 2014-11-28 ENCOUNTER — Encounter: Payer: Self-pay | Admitting: Nurse Practitioner

## 2014-11-28 ENCOUNTER — Ambulatory Visit (INDEPENDENT_AMBULATORY_CARE_PROVIDER_SITE_OTHER): Payer: Medicare Other | Admitting: Nurse Practitioner

## 2014-11-28 ENCOUNTER — Ambulatory Visit: Payer: Medicare Other | Admitting: Nurse Practitioner

## 2014-11-28 VITALS — BP 107/68 | HR 71 | Ht 62.0 in | Wt 130.2 lb

## 2014-11-28 DIAGNOSIS — R569 Unspecified convulsions: Secondary | ICD-10-CM

## 2014-11-28 MED ORDER — LEVETIRACETAM 500 MG PO TABS: 500.0000 mg | ORAL_TABLET | Freq: Two times a day (BID) | ORAL | Status: DC
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
## 2014-11-28 NOTE — Progress Notes (Signed)
I have read the note, and I agree with the clinical assessment and plan.  Joseph Copeland,Joseph Copeland   

## 2014-11-28 NOTE — Patient Instructions (Signed)
Continue Keppra 500 twice daily will refill for one year Call for any seizure activity Caution that alcohol lowers the seizure threshold as patient continues to drink Follow-up yearly and when necessary

## 2014-12-21 DIAGNOSIS — M4722 Other spondylosis with radiculopathy, cervical region: Secondary | ICD-10-CM | POA: Diagnosis not present

## 2014-12-21 DIAGNOSIS — M25511 Pain in right shoulder: Secondary | ICD-10-CM | POA: Diagnosis not present

## 2014-12-28 DIAGNOSIS — M7541 Impingement syndrome of right shoulder: Secondary | ICD-10-CM | POA: Diagnosis not present

## 2015-01-04 DIAGNOSIS — M7541 Impingement syndrome of right shoulder: Secondary | ICD-10-CM | POA: Diagnosis not present

## 2015-01-10 DIAGNOSIS — M19011 Primary osteoarthritis, right shoulder: Secondary | ICD-10-CM | POA: Diagnosis not present

## 2015-01-10 DIAGNOSIS — M7541 Impingement syndrome of right shoulder: Secondary | ICD-10-CM | POA: Diagnosis not present

## 2015-01-24 DIAGNOSIS — M75101 Unspecified rotator cuff tear or rupture of right shoulder, not specified as traumatic: Secondary | ICD-10-CM | POA: Diagnosis not present

## 2015-01-24 DIAGNOSIS — M7541 Impingement syndrome of right shoulder: Secondary | ICD-10-CM | POA: Diagnosis not present

## 2015-01-24 DIAGNOSIS — M24111 Other articular cartilage disorders, right shoulder: Secondary | ICD-10-CM | POA: Diagnosis not present

## 2015-01-24 DIAGNOSIS — S43431A Superior glenoid labrum lesion of right shoulder, initial encounter: Secondary | ICD-10-CM | POA: Diagnosis not present

## 2015-01-24 DIAGNOSIS — M75111 Incomplete rotator cuff tear or rupture of right shoulder, not specified as traumatic: Secondary | ICD-10-CM | POA: Diagnosis not present

## 2015-01-24 DIAGNOSIS — M129 Arthropathy, unspecified: Secondary | ICD-10-CM | POA: Diagnosis not present

## 2015-01-24 DIAGNOSIS — M19011 Primary osteoarthritis, right shoulder: Secondary | ICD-10-CM | POA: Diagnosis not present

## 2015-01-24 DIAGNOSIS — G8918 Other acute postprocedural pain: Secondary | ICD-10-CM | POA: Diagnosis not present

## 2015-02-03 DIAGNOSIS — Z4789 Encounter for other orthopedic aftercare: Secondary | ICD-10-CM | POA: Diagnosis not present

## 2015-02-03 DIAGNOSIS — M7541 Impingement syndrome of right shoulder: Secondary | ICD-10-CM | POA: Diagnosis not present

## 2015-02-07 DIAGNOSIS — M7541 Impingement syndrome of right shoulder: Secondary | ICD-10-CM | POA: Diagnosis not present

## 2015-02-09 DIAGNOSIS — M7541 Impingement syndrome of right shoulder: Secondary | ICD-10-CM | POA: Diagnosis not present

## 2015-02-14 DIAGNOSIS — M7541 Impingement syndrome of right shoulder: Secondary | ICD-10-CM | POA: Diagnosis not present

## 2015-02-16 DIAGNOSIS — M7541 Impingement syndrome of right shoulder: Secondary | ICD-10-CM | POA: Diagnosis not present

## 2015-02-21 DIAGNOSIS — M7541 Impingement syndrome of right shoulder: Secondary | ICD-10-CM | POA: Diagnosis not present

## 2015-02-23 DIAGNOSIS — M7541 Impingement syndrome of right shoulder: Secondary | ICD-10-CM | POA: Diagnosis not present

## 2015-02-28 DIAGNOSIS — M7541 Impingement syndrome of right shoulder: Secondary | ICD-10-CM | POA: Diagnosis not present

## 2015-03-02 DIAGNOSIS — M7541 Impingement syndrome of right shoulder: Secondary | ICD-10-CM | POA: Diagnosis not present

## 2015-03-06 DIAGNOSIS — Z4789 Encounter for other orthopedic aftercare: Secondary | ICD-10-CM | POA: Diagnosis not present

## 2015-03-07 DIAGNOSIS — M7541 Impingement syndrome of right shoulder: Secondary | ICD-10-CM | POA: Diagnosis not present

## 2015-03-09 DIAGNOSIS — M7541 Impingement syndrome of right shoulder: Secondary | ICD-10-CM | POA: Diagnosis not present

## 2015-03-14 DIAGNOSIS — M7541 Impingement syndrome of right shoulder: Secondary | ICD-10-CM | POA: Diagnosis not present

## 2015-03-16 DIAGNOSIS — M7541 Impingement syndrome of right shoulder: Secondary | ICD-10-CM | POA: Diagnosis not present

## 2015-04-05 DIAGNOSIS — Z23 Encounter for immunization: Secondary | ICD-10-CM | POA: Diagnosis not present

## 2015-10-18 DIAGNOSIS — Z7984 Long term (current) use of oral hypoglycemic drugs: Secondary | ICD-10-CM | POA: Diagnosis not present

## 2015-10-18 DIAGNOSIS — Z0000 Encounter for general adult medical examination without abnormal findings: Secondary | ICD-10-CM | POA: Diagnosis not present

## 2015-10-18 DIAGNOSIS — E1165 Type 2 diabetes mellitus with hyperglycemia: Secondary | ICD-10-CM | POA: Diagnosis not present

## 2015-10-18 DIAGNOSIS — I10 Essential (primary) hypertension: Secondary | ICD-10-CM | POA: Diagnosis not present

## 2015-10-18 DIAGNOSIS — Z1389 Encounter for screening for other disorder: Secondary | ICD-10-CM | POA: Diagnosis not present

## 2015-10-18 DIAGNOSIS — E7800 Pure hypercholesterolemia, unspecified: Secondary | ICD-10-CM | POA: Diagnosis not present

## 2015-11-28 ENCOUNTER — Ambulatory Visit (INDEPENDENT_AMBULATORY_CARE_PROVIDER_SITE_OTHER): Payer: Medicare Other | Admitting: Nurse Practitioner

## 2015-11-28 ENCOUNTER — Encounter: Payer: Self-pay | Admitting: Nurse Practitioner

## 2015-11-28 VITALS — BP 120/80 | HR 80 | Resp 20 | Ht 62.0 in | Wt 123.0 lb

## 2015-11-28 DIAGNOSIS — R569 Unspecified convulsions: Secondary | ICD-10-CM | POA: Diagnosis not present

## 2015-11-28 MED ORDER — LEVETIRACETAM 500 MG PO TABS: 500.0000 mg | ORAL_TABLET | Freq: Two times a day (BID) | ORAL | Status: DC
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
## 2015-11-28 NOTE — Progress Notes (Signed)
I have read the note, and I agree with the clinical assessment and plan.  Lalah Durango KEITH   

## 2015-11-28 NOTE — Patient Instructions (Signed)
Continue Keppra 500 twice daily will refill for one year 33mo with 3 reills Call for any seizure activity Follow-up yearly and when necessary

## 2016-01-15 ENCOUNTER — Encounter (HOSPITAL_COMMUNITY): Payer: Self-pay | Admitting: *Deleted

## 2016-01-15 ENCOUNTER — Emergency Department (HOSPITAL_COMMUNITY): Payer: Medicare Other

## 2016-01-15 DIAGNOSIS — K2210 Ulcer of esophagus without bleeding: Secondary | ICD-10-CM | POA: Diagnosis not present

## 2016-01-15 DIAGNOSIS — Z87891 Personal history of nicotine dependence: Secondary | ICD-10-CM

## 2016-01-15 DIAGNOSIS — E876 Hypokalemia: Secondary | ICD-10-CM | POA: Diagnosis present

## 2016-01-15 DIAGNOSIS — R42 Dizziness and giddiness: Secondary | ICD-10-CM | POA: Diagnosis present

## 2016-01-15 DIAGNOSIS — D6489 Other specified anemias: Secondary | ICD-10-CM | POA: Diagnosis present

## 2016-01-15 DIAGNOSIS — H409 Unspecified glaucoma: Secondary | ICD-10-CM | POA: Diagnosis present

## 2016-01-15 DIAGNOSIS — E119 Type 2 diabetes mellitus without complications: Secondary | ICD-10-CM | POA: Diagnosis present

## 2016-01-15 DIAGNOSIS — E440 Moderate protein-calorie malnutrition: Secondary | ICD-10-CM | POA: Diagnosis present

## 2016-01-15 DIAGNOSIS — K2950 Unspecified chronic gastritis without bleeding: Secondary | ICD-10-CM | POA: Diagnosis present

## 2016-01-15 DIAGNOSIS — Z6821 Body mass index (BMI) 21.0-21.9, adult: Secondary | ICD-10-CM

## 2016-01-15 DIAGNOSIS — F1020 Alcohol dependence, uncomplicated: Secondary | ICD-10-CM | POA: Diagnosis present

## 2016-01-15 DIAGNOSIS — R1310 Dysphagia, unspecified: Secondary | ICD-10-CM | POA: Diagnosis present

## 2016-01-15 DIAGNOSIS — K5730 Diverticulosis of large intestine without perforation or abscess without bleeding: Secondary | ICD-10-CM | POA: Diagnosis not present

## 2016-01-15 DIAGNOSIS — G4733 Obstructive sleep apnea (adult) (pediatric): Secondary | ICD-10-CM | POA: Diagnosis present

## 2016-01-15 DIAGNOSIS — R740 Nonspecific elevation of levels of transaminase and lactic acid dehydrogenase [LDH]: Secondary | ICD-10-CM | POA: Diagnosis present

## 2016-01-15 DIAGNOSIS — E860 Dehydration: Secondary | ICD-10-CM | POA: Diagnosis present

## 2016-01-15 DIAGNOSIS — I10 Essential (primary) hypertension: Secondary | ICD-10-CM | POA: Diagnosis present

## 2016-01-15 DIAGNOSIS — D649 Anemia, unspecified: Secondary | ICD-10-CM | POA: Diagnosis present

## 2016-01-15 DIAGNOSIS — K449 Diaphragmatic hernia without obstruction or gangrene: Secondary | ICD-10-CM | POA: Diagnosis present

## 2016-01-15 DIAGNOSIS — R000 Tachycardia, unspecified: Secondary | ICD-10-CM | POA: Diagnosis present

## 2016-01-15 DIAGNOSIS — R17 Unspecified jaundice: Secondary | ICD-10-CM | POA: Diagnosis present

## 2016-01-15 DIAGNOSIS — K259 Gastric ulcer, unspecified as acute or chronic, without hemorrhage or perforation: Secondary | ICD-10-CM | POA: Diagnosis present

## 2016-01-15 DIAGNOSIS — K210 Gastro-esophageal reflux disease with esophagitis: Secondary | ICD-10-CM | POA: Diagnosis present

## 2016-01-15 DIAGNOSIS — G40909 Epilepsy, unspecified, not intractable, without status epilepticus: Secondary | ICD-10-CM | POA: Diagnosis present

## 2016-01-15 DIAGNOSIS — R079 Chest pain, unspecified: Secondary | ICD-10-CM | POA: Diagnosis not present

## 2016-01-15 DIAGNOSIS — I951 Orthostatic hypotension: Secondary | ICD-10-CM | POA: Diagnosis present

## 2016-01-15 DIAGNOSIS — R531 Weakness: Secondary | ICD-10-CM | POA: Diagnosis not present

## 2016-01-15 DIAGNOSIS — J449 Chronic obstructive pulmonary disease, unspecified: Secondary | ICD-10-CM | POA: Diagnosis present

## 2016-01-15 LAB — I-STAT TROPONIN, ED: TROPONIN I, POC: 0.01 ng/mL (ref 0.00–0.08)

## 2016-01-15 LAB — BASIC METABOLIC PANEL
ANION GAP: 15 (ref 5–15)
BUN: 24 mg/dL — ABNORMAL HIGH (ref 6–20)
CALCIUM: 10.7 mg/dL — AB (ref 8.9–10.3)
CHLORIDE: 100 mmol/L — AB (ref 101–111)
CO2: 21 mmol/L — AB (ref 22–32)
Creatinine, Ser: 1.18 mg/dL (ref 0.61–1.24)
GFR calc non Af Amer: 59 mL/min — ABNORMAL LOW (ref >60)
Glucose, Bld: 207 mg/dL — ABNORMAL HIGH (ref 65–99)
Potassium: 3.2 mmol/L — ABNORMAL LOW (ref 3.5–5.1)
Sodium: 136 mmol/L (ref 135–145)

## 2016-01-15 LAB — CBC
HCT: 38.3 % — ABNORMAL LOW (ref 39.0–52.0)
HEMOGLOBIN: 12.5 g/dL — AB (ref 13.0–17.0)
MCH: 29.5 pg (ref 26.0–34.0)
MCHC: 32.6 g/dL (ref 30.0–36.0)
MCV: 90.3 fL (ref 78.0–100.0)
Platelets: 156 10*3/uL (ref 150–400)
RBC: 4.24 MIL/uL (ref 4.22–5.81)
RDW: 17.2 % — ABNORMAL HIGH (ref 11.5–15.5)
WBC: 12.3 10*3/uL — ABNORMAL HIGH (ref 4.0–10.5)

## 2016-01-15 NOTE — ED Triage Notes (Signed)
Pt reports weakness, loss of appetite, heartburn, n/v. Pt denies diarrhea, shortness of breath. All symptoms started 10 days ago. Pt denies pain at this time.

## 2016-01-16 ENCOUNTER — Emergency Department (HOSPITAL_COMMUNITY): Payer: Medicare Other

## 2016-01-16 ENCOUNTER — Inpatient Hospital Stay (HOSPITAL_COMMUNITY)
Admission: EM | Admit: 2016-01-16 | Discharge: 2016-01-21 | DRG: 381 | Disposition: A | Discharge status: Home / Self Care | Payer: Medicare Other | Attending: Internal Medicine | Admitting: Internal Medicine

## 2016-01-16 ENCOUNTER — Encounter (HOSPITAL_COMMUNITY): Payer: Self-pay | Admitting: Radiology

## 2016-01-16 DIAGNOSIS — R634 Abnormal weight loss: Secondary | ICD-10-CM

## 2016-01-16 DIAGNOSIS — R000 Tachycardia, unspecified: Secondary | ICD-10-CM

## 2016-01-16 DIAGNOSIS — R740 Nonspecific elevation of levels of transaminase and lactic acid dehydrogenase [LDH]: Secondary | ICD-10-CM

## 2016-01-16 DIAGNOSIS — R1310 Dysphagia, unspecified: Secondary | ICD-10-CM | POA: Diagnosis not present

## 2016-01-16 DIAGNOSIS — E118 Type 2 diabetes mellitus with unspecified complications: Secondary | ICD-10-CM | POA: Diagnosis not present

## 2016-01-16 DIAGNOSIS — I951 Orthostatic hypotension: Secondary | ICD-10-CM | POA: Diagnosis not present

## 2016-01-16 DIAGNOSIS — G40909 Epilepsy, unspecified, not intractable, without status epilepticus: Secondary | ICD-10-CM

## 2016-01-16 DIAGNOSIS — K5730 Diverticulosis of large intestine without perforation or abscess without bleeding: Secondary | ICD-10-CM | POA: Diagnosis not present

## 2016-01-16 DIAGNOSIS — D649 Anemia, unspecified: Secondary | ICD-10-CM | POA: Diagnosis present

## 2016-01-16 DIAGNOSIS — E876 Hypokalemia: Secondary | ICD-10-CM

## 2016-01-16 DIAGNOSIS — I10 Essential (primary) hypertension: Secondary | ICD-10-CM | POA: Diagnosis present

## 2016-01-16 DIAGNOSIS — R7401 Elevation of levels of liver transaminase levels: Secondary | ICD-10-CM

## 2016-01-16 DIAGNOSIS — E440 Moderate protein-calorie malnutrition: Secondary | ICD-10-CM | POA: Insufficient documentation

## 2016-01-16 LAB — GLUCOSE, CAPILLARY
GLUCOSE-CAPILLARY: 138 mg/dL — AB (ref 65–99)
GLUCOSE-CAPILLARY: 178 mg/dL — AB (ref 65–99)
Glucose-Capillary: 113 mg/dL — ABNORMAL HIGH (ref 65–99)

## 2016-01-16 LAB — URINALYSIS, ROUTINE W REFLEX MICROSCOPIC
Glucose, UA: NEGATIVE mg/dL
Hgb urine dipstick: NEGATIVE
KETONES UR: 40 mg/dL — AB
NITRITE: NEGATIVE
PH: 6 (ref 5.0–8.0)
Protein, ur: 30 mg/dL — AB
SPECIFIC GRAVITY, URINE: 1.026 (ref 1.005–1.030)

## 2016-01-16 LAB — HEPATIC FUNCTION PANEL
ALK PHOS: 84 U/L (ref 38–126)
ALT: 18 U/L (ref 17–63)
AST: 54 U/L — ABNORMAL HIGH (ref 15–41)
Albumin: 4.5 g/dL (ref 3.5–5.0)
BILIRUBIN INDIRECT: 1.4 mg/dL — AB (ref 0.3–0.9)
BILIRUBIN TOTAL: 1.8 mg/dL — AB (ref 0.3–1.2)
Bilirubin, Direct: 0.4 mg/dL (ref 0.1–0.5)
TOTAL PROTEIN: 8.9 g/dL — AB (ref 6.5–8.1)

## 2016-01-16 LAB — DIFFERENTIAL
Basophils Absolute: 0 10*3/uL (ref 0.0–0.1)
Basophils Relative: 0 %
EOS ABS: 0.1 10*3/uL (ref 0.0–0.7)
EOS PCT: 1 %
LYMPHS PCT: 7 %
Lymphs Abs: 0.8 10*3/uL (ref 0.7–4.0)
MONO ABS: 0.9 10*3/uL (ref 0.1–1.0)
Monocytes Relative: 7 %
Neutro Abs: 10.7 10*3/uL — ABNORMAL HIGH (ref 1.7–7.7)
Neutrophils Relative %: 85 %

## 2016-01-16 LAB — URINE MICROSCOPIC-ADD ON

## 2016-01-16 LAB — PROTIME-INR
INR: 1.24 (ref 0.00–1.49)
PROTHROMBIN TIME: 15.8 s — AB (ref 11.6–15.2)

## 2016-01-16 MED ORDER — LORAZEPAM 2 MG/ML IJ SOLN: 1.0000 mg | Freq: Four times a day (QID) | INTRAMUSCULAR | Status: AC | PRN

## 2016-01-16 MED ORDER — SODIUM CHLORIDE 0.9 % IV SOLN
1000.0000 mL | Freq: Once | INTRAVENOUS | Status: AC
  Administered 2016-01-16: 1000 mL via INTRAVENOUS

## 2016-01-16 MED ORDER — ENOXAPARIN SODIUM 40 MG/0.4ML ~~LOC~~ SOLN
40.0000 mg | Freq: Every day | SUBCUTANEOUS | Status: DC
  Administered 2016-01-16 – 2016-01-21 (×6): 40 mg via SUBCUTANEOUS
  Filled 2016-01-16 (×6): qty 0.4

## 2016-01-16 MED ORDER — VITAMIN B-1 100 MG PO TABS
100.0000 mg | ORAL_TABLET | Freq: Every day | ORAL | Status: DC
  Administered 2016-01-16 – 2016-01-21 (×6): 100 mg via ORAL
  Filled 2016-01-16 (×6): qty 1

## 2016-01-16 MED ORDER — SODIUM CHLORIDE 0.9% FLUSH
3.0000 mL | Freq: Two times a day (BID) | INTRAVENOUS | Status: DC
  Administered 2016-01-16 – 2016-01-20 (×4): 3 mL via INTRAVENOUS

## 2016-01-16 MED ORDER — IOPAMIDOL (ISOVUE-300) INJECTION 61%
INTRAVENOUS | Status: AC
  Administered 2016-01-16: 100 mL
  Filled 2016-01-16: qty 100

## 2016-01-16 MED ORDER — SODIUM CHLORIDE 0.9 % IV SOLN
1000.0000 mL | INTRAVENOUS | Status: DC
  Administered 2016-01-16 – 2016-01-20 (×6): 1000 mL via INTRAVENOUS

## 2016-01-16 MED ORDER — SODIUM CHLORIDE 0.9 % IV SOLN: INTRAVENOUS | Status: DC

## 2016-01-16 MED ORDER — POTASSIUM CHLORIDE CRYS ER 20 MEQ PO TBCR
40.0000 meq | EXTENDED_RELEASE_TABLET | Freq: Once | ORAL | Status: AC
Start: 2016-01-16 — End: 2016-01-16
  Administered 2016-01-16: 40 meq via ORAL
  Filled 2016-01-16: qty 2

## 2016-01-16 MED ORDER — PANTOPRAZOLE SODIUM 40 MG PO TBEC
40.0000 mg | DELAYED_RELEASE_TABLET | Freq: Every day | ORAL | Status: DC
  Administered 2016-01-16: 40 mg via ORAL
  Filled 2016-01-16: qty 1

## 2016-01-16 MED ORDER — ACETAMINOPHEN 325 MG PO TABS: 650.0000 mg | ORAL_TABLET | Freq: Four times a day (QID) | ORAL | Status: DC | PRN

## 2016-01-16 MED ORDER — ONDANSETRON HCL 4 MG PO TABS: 4.0000 mg | ORAL_TABLET | Freq: Four times a day (QID) | ORAL | Status: DC | PRN

## 2016-01-16 MED ORDER — THIAMINE HCL 100 MG/ML IJ SOLN
100.0000 mg | Freq: Every day | INTRAMUSCULAR | Status: DC
  Filled 2016-01-16: qty 2

## 2016-01-16 MED ORDER — POLYETHYLENE GLYCOL 3350 17 G PO PACK: 17.0000 g | PACK | Freq: Every day | ORAL | Status: DC | PRN

## 2016-01-16 MED ORDER — SODIUM CHLORIDE 0.9 % IV SOLN
INTRAVENOUS | Status: AC
  Administered 2016-01-16: 12:00:00 via INTRAVENOUS

## 2016-01-16 MED ORDER — LEVETIRACETAM 500 MG PO TABS
500.0000 mg | ORAL_TABLET | Freq: Two times a day (BID) | ORAL | Status: DC
  Administered 2016-01-16 – 2016-01-21 (×11): 500 mg via ORAL
  Filled 2016-01-16 (×11): qty 1

## 2016-01-16 MED ORDER — ACETAMINOPHEN 650 MG RE SUPP: 650.0000 mg | Freq: Four times a day (QID) | RECTAL | Status: DC | PRN

## 2016-01-16 MED ORDER — TRAZODONE HCL 50 MG PO TABS: 25.0000 mg | ORAL_TABLET | Freq: Every evening | ORAL | Status: DC | PRN

## 2016-01-16 MED ORDER — FOLIC ACID 1 MG PO TABS
1.0000 mg | ORAL_TABLET | Freq: Every day | ORAL | Status: DC
  Administered 2016-01-16 – 2016-01-21 (×6): 1 mg via ORAL
  Filled 2016-01-16 (×6): qty 1

## 2016-01-16 MED ORDER — GI COCKTAIL ~~LOC~~
30.0000 mL | Freq: Once | ORAL | Status: AC
  Administered 2016-01-16: 30 mL via ORAL
  Filled 2016-01-16: qty 30

## 2016-01-16 MED ORDER — BISACODYL 5 MG PO TBEC: 5.0000 mg | DELAYED_RELEASE_TABLET | Freq: Every day | ORAL | Status: DC | PRN

## 2016-01-16 MED ORDER — METOPROLOL TARTRATE 25 MG PO TABS
25.0000 mg | ORAL_TABLET | Freq: Every day | ORAL | Status: DC
  Administered 2016-01-16 – 2016-01-21 (×6): 25 mg via ORAL
  Filled 2016-01-16 (×6): qty 1

## 2016-01-16 MED ORDER — BOOST PO LIQD
237.0000 mL | Freq: Every day | ORAL | Status: DC
  Administered 2016-01-16: 237 mL via ORAL
  Filled 2016-01-16 (×4): qty 237

## 2016-01-16 MED ORDER — ADULT MULTIVITAMIN W/MINERALS CH
1.0000 | ORAL_TABLET | Freq: Every day | ORAL | Status: DC
  Administered 2016-01-16 – 2016-01-21 (×6): 1 via ORAL
  Filled 2016-01-16 (×6): qty 1

## 2016-01-16 MED ORDER — ONDANSETRON HCL 4 MG/2ML IJ SOLN: 4.0000 mg | Freq: Four times a day (QID) | INTRAMUSCULAR | Status: DC | PRN

## 2016-01-16 MED ORDER — INSULIN ASPART 100 UNIT/ML ~~LOC~~ SOLN
0.0000 [IU] | Freq: Three times a day (TID) | SUBCUTANEOUS | Status: DC
  Administered 2016-01-16: 2 [IU] via SUBCUTANEOUS
  Administered 2016-01-16: 1 [IU] via SUBCUTANEOUS
  Administered 2016-01-18: 2 [IU] via SUBCUTANEOUS
  Administered 2016-01-20 – 2016-01-21 (×2): 1 [IU] via SUBCUTANEOUS

## 2016-01-16 MED ORDER — LEVETIRACETAM 500 MG PO TABS
500.0000 mg | ORAL_TABLET | Freq: Once | ORAL | Status: AC
  Administered 2016-01-16: 500 mg via ORAL
  Filled 2016-01-16: qty 1

## 2016-01-16 MED ORDER — SODIUM CHLORIDE 0.9 % IV SOLN
INTRAVENOUS | Status: DC
  Administered 2016-01-16: 19:00:00 via INTRAVENOUS

## 2016-01-16 MED ORDER — LORAZEPAM 1 MG PO TABS: 1.0000 mg | ORAL_TABLET | Freq: Four times a day (QID) | ORAL | Status: AC | PRN

## 2016-01-16 NOTE — Consult Note (Addendum)
Referring Provider: Dr. Marily Memos Primary Care Physician:  Henrine Screws, MD Primary Gastroenterologist:  Althia Forts  Reason for Consultation:  Dysphagia  HPI: Joseph Copeland is a 75 y.o. male with multiple medical problems seen for a consult due to dysphagia. Has been having recurrent dysphagia to solids since fall 2016 stating that solid food is very slow to go down. No problems swallowing liquids. Can eat pureed type of foods and soft foods without difficulty. Denies any abdominal pain. Has occasional black vomitus and states it last occurred less than a week ago. Occasional black stools. Denies red blood vomitus and denies hematochezia. 25 pound unintentional weight loss since December 2016. CT scan showed distal circumferential esophageal thickening. Normal EGD in 2010. Colonoscopy in 2013 (Dr. Wynetta Emery) showed diffuse diverticulosis. Wife and daughter at bedside.  Past Medical History:  Diagnosis Date  . Arthritis   . Diabetes mellitus    type 2-no meds  . Glaucoma   . Hypertension   . Low testosterone   . Seizures ([place])    first12/12-none since  . Sleep apnea    nasal CPAP at HS    Past Surgical History:  Procedure Laterality Date  . COLONOSCOPY    . hemrhoidectomy  1988  . LIPOSUCTION  10/07/2011   Procedure: LIPOSUCTION;  Surgeon: Cristine Polio, MD;  Location: [facility];  Service: Plastics;  Laterality: Left;  Marland Kitchen MASS EXCISION  10/07/2011   Procedure: EXCISION MASS;  Surgeon: Cristine Polio, MD;  Location: [facility];  Service: Plastics;  Laterality: Left;  excision of large mass on left back with possible lipo assistence    Prior to Admission medications   Medication Sig Start Date End Date Taking? Authorizing Provider  Cyanocobalamin (VITAMIN B 12 PO) Take 1 tablet by mouth daily.     Yes Historical Provider, MD  lactose free nutrition (BOOST) LIQD Take 237 mLs by mouth daily.   Yes Historical Provider, MD  levETIRAcetam (KEPPRA) 500  MG tablet Take 1 tablet (500 mg total) by mouth 2 (two) times daily. 11/28/15  Yes Dennie Bible, NP  metoprolol tartrate (LOPRESSOR) 25 MG tablet Take 25 mg by mouth daily.   Yes Historical Provider, MD  testosterone (ANDROGEL) 50 MG/5GM GEL Place 5 g onto the skin daily.   Yes Historical Provider, MD    Scheduled Meds: . sodium chloride   Intravenous STAT  . enoxaparin (LOVENOX) injection  40 mg Subcutaneous Daily  . folic acid  1 mg Oral Daily  . insulin aspart  0-9 Units Subcutaneous TID WC  . lactose free nutrition  237 mL Oral Daily  . levETIRAcetam  500 mg Oral BID  . metoprolol tartrate  25 mg Oral Daily  . multivitamin with minerals  1 tablet Oral Daily  . pantoprazole  40 mg Oral Daily  . sodium chloride flush  3 mL Intravenous Q12H  . thiamine  100 mg Oral Daily   Or  . thiamine  100 mg Intravenous Daily   Continuous Infusions: . sodium chloride Stopped (01/16/16 0705)   PRN Meds:.acetaminophen **OR** acetaminophen, bisacodyl, LORazepam **OR** LORazepam, ondansetron **OR** ondansetron (ZOFRAN) IV, polyethylene glycol, traZODone  Allergies as of 01/15/2016  . (No Known Allergies)    Family History  Problem Relation Age of Onset  . Alcohol abuse Father   . Alcohol abuse Paternal Grandfather     Social History   Social History  . Marital status: Married    Spouse name: Mickel Baas   . Number of children: 2  .  Years of education: Masters   Occupational History  .  [employer]  . Retired     Social History Main Topics  . Smoking status: Former Smoker    Quit date: 10/03/1966  . Smokeless tobacco: Never Used     Comment: quit smoking 1971  . Alcohol use 12.6 oz/week    21 Shots of liquor per week     Comment: occ  . Drug use: No  . Sexual activity: Not Currently   Other Topics Concern  . Not on file   Social History Narrative   01/15/2013 AHW  "[name]" was born and grew up in [place], [place]. He has one older brother, and 2 sisters, one older and  one younger. He reports that his childhood was "bleak," and states that he will was very poor. He graduated from high school, and achieved a BS in Astronomer from [employer] in [place]. 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 He also served several years in the [place], and was stationed in [place], and saw 5 combat in [place]. He is currently married to his wife for 11 years, and they have 2 daughters. He denies any legal problems. His hobbies include reading and listening to jazz. He affiliates as a Nurse, learning disability. He reports his social support system consists of his oldest daughter, his brother, and his wife. 01/15/2013 AHW    Review of Systems: All negative except as stated above in HPI.  Physical Exam: Vital signs: Vitals:   01/16/16 0800 01/16/16 1552  BP: 117/75 (!) 95/52  Pulse: 100 92  Resp: 13 16  Temp:  99.3 F (37.4 C)   Last BM Date:  ("one week ago") General:  Lethargic, elderly, frail, pleasant and cooperative in NAD, thin HEENT: anicteric sclera, oropharynx clear Neck: supple, nontender Lungs:  Clear throughout to auscultation.   No wheezes, crackles, or rhonchi. No acute distress. Heart:  Regular rate and rhythm; no murmurs, clicks, rubs,  or gallops. Abdomen: soft, nontender, nondistended, +BS  Rectal:  Deferred Ext: no edema Skin: no jaundice  GI:  Lab Results:  Recent Labs  01/15/16 2252  WBC 12.3*  HGB 12.5*  HCT 38.3*  PLT 156   BMET  Recent Labs  01/15/16 2252  NA 136  K 3.2*  CL 100*  CO2 21*  GLUCOSE 207*  BUN 24*  CREATININE 1.18  CALCIUM 10.7*   LFT  Recent Labs  01/15/16 0256  PROT 8.9*  ALBUMIN 4.5  AST 54*  ALT 18  ALKPHOS 84  BILITOT 1.8*  BILIDIR 0.4  IBILI 1.4*   PT/INR  Recent Labs  01/16/16 1130  LABPROT 15.8*  INR 1.24     Studies/Results: Dg Chest 2 View  Result Date: 01/15/2016 CLINICAL DATA:  Mid upper chest pain for the past 7-10 days.  Vomiting. Decreased appetite. History of diabetes and hypertension. EXAM: CHEST  2 VIEW COMPARISON:  03/05/2012; 02/13/2012 FINDINGS: Grossly unchanged cardiac silhouette and mediastinal contours with atherosclerotic plaque within the thoracic aorta. The lungs remain hyperexpanded. No focal airspace opacities. No pleural effusion or pneumothorax. No evidence of edema. No acute osseous abnormalities. Stigmata DISH within the thoracic spine. Enthesopathic change involving the right rotator cuff insertion site, incompletely evaluated. IMPRESSION: 1. Hyperexpanded lungs without acute cardiopulmonary disease. 2.  Aortic Atherosclerosis (ICD10-170.0) Electronically Signed   By: Sandi Mariscal M.D.   On: 01/15/2016 23:04  Ct Abdomen Pelvis W Contrast  Result  Date: 01/16/2016 CLINICAL DATA:  75 year old male with weakness and loss of appetite. Heartburn, nausea vomiting. EXAM: CT ABDOMEN AND PELVIS WITH CONTRAST TECHNIQUE: Multidetector CT imaging of the abdomen and pelvis was performed using the standard protocol following bolus administration of intravenous contrast. CONTRAST:  165mL ISOVUE-300 IOPAMIDOL (ISOVUE-300) INJECTION 61% COMPARISON:  CT dated 03/07/2012 FINDINGS: The visualized lung bases are clear. No intra-abdominal free air or free fluid. Diffuse fatty infiltration of the liver. Multiple hepatic hypodense lesions measuring up to 3.1 x 2.7 cm in the left lobe of the liver have increased in size compared to the prior study. These lesions are incompletely characterized but likely represent cysts or hemangioma. Small amount of sludge or small stones noted layering within the gallbladder. No pericholecystic fluid. The pancreas, spleen, adrenal glands appear unremarkable. There is a 1.5 cm left renal interpolar parapelvic cyst. Small bilateral renal hypodensities likely represent cysts. There is no hydronephrosis on either side. The visualized ureters and urinary bladder appear unremarkable. The prostate gland is  enlarged measuring 5 cm in diameter. The seminal vesicles are grossly unremarkable. There is a small hiatal hernia with gastroesophageal reflux. There is diffuse circumferential thickening of the distal esophagus with stranding of the adjacent fat compatible with esophagitis. Findings may be related to recurrent reflux. Esophageal mass is less likely but not excluded. Only the distal portion of the esophagus is included in this study. There is colonic diverticulosis without active inflammatory changes. Moderate stool noted throughout the colon. No evidence of bowel obstruction. Normal appendix. The abdominal aorta and IVC appear unremarkable. The origins of the celiac axis, SMA, IMA as well as the origins of the renal arteries are patent. 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 The SMV, splenic vein, and main portal vein are patent. No portal venous gas identified. There is no adenopathy. There is a small fat containing umbilical hernia. The abdominal wall soft tissues are otherwise unremarkable. There is multilevel degenerative changes of the spine with osteophyte formation. L3-L4 and L4-5 disc desiccation with vacuum phenomena. No acute fracture. IMPRESSION: Small hiatal hernia with findings of reflux esophagitis. Clinical correlation is recommended. No bowel obstruction. Normal appendix. Colonic diverticulosis. Electronically Signed   By: Anner Crete M.D.   On: 01/16/2016 06:12   Impression/Plan: 75 yo with progressive solid food dysphagia and weight loss concerning for esophageal malignancy. Peptic stricture also possible but needs an EGD with possible dilation as the next step. Clear liquid diet. NPO p MN. EGD tomorrow morning. Risks/benefits discussed with patient and his wife and daughter and they agree to proceed.    LOS: 0 days   Melville C.  01/16/2016, 5:41 PM  Pager [phone]  If no answer or after 5  PM call [phone]

## 2016-01-16 NOTE — Anesthesia Preprocedure Evaluation (Addendum)
Anesthesia Evaluation  Patient identified by MRN, date of birth, ID band Patient awake    Reviewed: Allergy & Precautions, H&P , NPO status , Patient's Chart, lab work & pertinent test results, reviewed documented beta blocker date and time   Airway Mallampati: II  TM Distance: >3 FB Neck ROM: Full    Dental no notable dental hx. (+) Teeth Intact, Dental Advisory Given   Pulmonary neg pulmonary ROS, sleep apnea and Continuous Positive Airway Pressure Ventilation , former smoker,    Pulmonary exam normal breath sounds clear to auscultation       Cardiovascular hypertension, Pt. on medications and Pt. on home beta blockers negative cardio ROS   Rhythm:Regular Rate:Normal     Neuro/Psych Seizures -, Well Controlled,  negative neurological ROS  negative psych ROS   GI/Hepatic negative GI ROS, Neg liver ROS,   Endo/Other  negative endocrine ROSdiabetes, Type 2  Renal/GU negative Renal ROS  negative genitourinary   Musculoskeletal  (+) Arthritis , Osteoarthritis,    Abdominal   Peds  Hematology negative hematology ROS (+) anemia ,   Anesthesia Other Findings   Reproductive/Obstetrics negative OB ROS                            Anesthesia Physical Anesthesia Plan  ASA: III  Anesthesia Plan: MAC   Post-op Pain Management:    Induction: Intravenous  Airway Management Planned: Nasal Cannula  Additional Equipment:   Intra-op Plan:   Post-operative Plan:   Informed Consent: I have reviewed the patients History and Physical, chart, labs and discussed the procedure including the risks, benefits and alternatives for the proposed anesthesia with the patient or authorized representative who has indicated his/her understanding and acceptance.   Dental advisory given  Plan Discussed with: CRNA  Anesthesia Plan Comments:         Anesthesia Quick Evaluation

## 2016-01-16 NOTE — ED Notes (Signed)
Pt reports "I have been sleeping a lot and having trouble walking where I sometimes get lightheaded"; pt denies falls

## 2016-01-16 NOTE — ED Notes (Signed)
RN called to add on labs

## 2016-01-16 NOTE — ED Notes (Signed)
Family at bedside.  Wife and daughter 

## 2016-01-16 NOTE — ED Provider Notes (Signed)
[facility] DEPT Provider Note   CSN: [id number] Arrival date & time: 01/15/16  2215  First Provider Contact:  First MD Initiated Contact with Patient 01/16/16 0256     By signing my name below, I, Joseph Copeland, attest that this documentation has been prepared under the direction and in the presence of Joseph Fuel, MD. Electronically Signed: Altamease Copeland, ED Scribe. 01/16/16. 3:10 AM   History   Chief Complaint Chief Complaint  Patient presents with  . Weakness  . Anorexia  . Chest Pain    HPI  The history is provided by the patient. No language interpreter was used.   Joseph Copeland is a 75 y.o. male with PMHx of DM, HTN, alcoohol abuse, and seizures who presents to the Emergency Department complaining of difficulty swallowing with onset 10 days ago. Pt states that he is only able to swallow liquids and consequently  has not been eating solid  food.  Associated symptoms include fatigue, generalized weakness, heartburn that improves with Mylanta, nausea, vomiting, and occasional night sweats. He estimates losing 15 pounds over the last 3-4 months. Pt denies abdominal pain. He states that until 10 days ago he was a heavy drinker. Pt quit smoking in 1978 and denies chewing tobacco. He did not take his Keppra yesterday.   Past Medical History:  Diagnosis Date  . Arthritis   . Diabetes mellitus    type 2-no meds  . Glaucoma   . Hypertension   . Low testosterone   . Seizures ([address])    first12/12-none since  . Sleep apnea    nasal CPAP at HS    Patient Active Problem List   Diagnosis Date Noted  . Alcohol dependence ([place]) 01/18/2013  . Onychomycosis 12/01/2012  . Pain in joint, ankle and foot 12/01/2012  . UTI (lower urinary tract infection) 02/09/2012  . Seizures, generalized convulsive ([place]) 06/03/2011  . HTN (hypertension) 06/03/2011  . DM (diabetes mellitus), type 2 ([place]) 06/03/2011  . Anemia 06/03/2011    Past Surgical History:  Procedure Laterality Date    . COLONOSCOPY    . hemrhoidectomy  1988  . LIPOSUCTION  10/07/2011   Procedure: LIPOSUCTION;  Surgeon: Cristine Polio, MD;  Location: [facility];  Service: Plastics;  Laterality: Left;  Marland Kitchen MASS EXCISION  10/07/2011   Procedure: EXCISION MASS;  Surgeon: Cristine Polio, MD;  Location: [facility];  Service: Plastics;  Laterality: Left;  excision of large mass on left back with possible lipo assistence       Home Medications    Prior to Admission medications   Medication Sig Start Date End Date Taking? Authorizing Provider  Cyanocobalamin (VITAMIN B 12 PO) Take 1 tablet by mouth daily.      Historical Provider, MD  levETIRAcetam (KEPPRA) 500 MG tablet Take 1 tablet (500 mg total) by mouth 2 (two) times daily. 11/28/15   Dennie Bible, NP  metoprolol tartrate (LOPRESSOR) 25 MG tablet Take 25 mg by mouth daily.    Historical Provider, MD  Milk Thistle 1000 MG CAPS Take 1 tablet by mouth daily.    Historical Provider, MD  Multiple Vitamin (MULTIVITAMIN WITH MINERALS) TABS Take 1 tablet by mouth daily.    Historical Provider, MD  PREVIDENT 5000 BOOSTER PLUS 1.1 % PSTE Take 1 application by mouth daily.  11/24/13   Historical Provider, MD  saw palmetto 160 MG capsule Take 160 mg by mouth 2 (two) times daily.    Historical Provider, MD  Tamsulosin HCl (FLOMAX)  0.4 MG CAPS Take 0.4 mg by mouth daily.    Historical Provider, MD  testosterone (ANDROGEL) 50 MG/5GM GEL Place 5 g onto the skin daily.    Historical Provider, MD  vitamin E 100 UNIT capsule Take 100 Units by mouth daily.    Historical Provider, MD    Family History Family History  Problem Relation Age of Onset  . Alcohol abuse Father   . Alcohol abuse Paternal Grandfather     Social History Social History  Substance Use Topics  . Smoking status: Former Smoker    Quit date: 10/03/1966  . Smokeless tobacco: Never Used     Comment: quit smoking 1971  . Alcohol use 12.6 oz/week    21 Shots of  liquor per week     Comment: occ     Allergies   Review of patient's allergies indicates no known allergies.   Review of Systems Review of Systems  Constitutional: Positive for fatigue. Negative for fever.  HENT: Positive for trouble swallowing.   Gastrointestinal: Positive for nausea and vomiting. Negative for diarrhea.       Heartburn   Neurological: Positive for weakness (generalized).  All other systems reviewed and are negative.    Physical Exam Updated Vital Signs BP (!) 142/105 (BP Location: Right Arm)   Pulse 116   Temp 97.9 F (36.6 C) (Oral)   Resp 20   Ht 5\' 3"  (1.6 m)   SpO2 100%   Physical Exam  Constitutional: He is oriented to person, place, and time. He appears well-developed and well-nourished.  HENT:  Head: Normocephalic and atraumatic.  Eyes: EOM are normal. Pupils are equal, round, and reactive to light. No scleral icterus.  Neck: Normal range of motion. Neck supple. No JVD present.  Cardiovascular: Regular rhythm, normal heart sounds and intact distal pulses.   No murmur heard. Tachycardic  Pulmonary/Chest: Effort normal and breath sounds normal. He has no wheezes. He has no rales. He exhibits no tenderness.  Abdominal: Soft. Bowel sounds are normal. He exhibits no distension and no mass. There is no tenderness.  Musculoskeletal: Normal range of motion. He exhibits no edema.  Lymphadenopathy:    He has no cervical adenopathy.  Neurological: He is alert and oriented to person, place, and time. No cranial nerve deficit. He exhibits normal muscle tone. Coordination normal.  Skin: Skin is warm and dry. No rash noted.  Psychiatric: He has a normal mood and affect. His behavior is normal. Judgment and thought content normal.  Nursing note and vitals reviewed.    ED Treatments / Results  Labs (all labs ordered are listed, but only abnormal results are displayed) Labs Reviewed  BASIC METABOLIC PANEL - Abnormal; Notable for the following:        Result Value   Potassium 3.2 (*)    Chloride 100 (*)    CO2 21 (*)    Glucose, Bld 207 (*)    BUN 24 (*)    Calcium 10.7 (*)    GFR calc non Af Amer 59 (*)    All other components within normal limits  CBC - Abnormal; Notable for the following:    WBC 12.3 (*)    Hemoglobin 12.5 (*)    HCT 38.3 (*)    RDW 17.2 (*)    All other components within normal limits  DIFFERENTIAL - Abnormal; Notable for the following:    Neutro Abs 10.7 (*)    All other components within normal limits  HEPATIC FUNCTION PANEL  URINALYSIS, ROUTINE W REFLEX MICROSCOPIC (NOT AT [facility])  I-STAT TROPOININ, ED    EKG  EKG Interpretation  Date/Time:  Monday January 15 2016 22:40:56 EDT Ventricular Rate:  116 PR Interval:  128 QRS Duration: 68 QT Interval:  304 QTC Calculation: 422 R Axis:   7 Text Interpretation:  Sinus tachycardia Otherwise normal ECG When compared with ECG of 03/05/2016 No significant change was found Confirmed by [facility]  MD, Ednamae Schiano ([id number]) on 01/16/2016 2:53:51 AM       Radiology Dg Chest 2 View  Result Date: 01/15/2016 CLINICAL DATA:  Mid upper chest pain for the past 7-10 days. Vomiting. Decreased appetite. History of diabetes and hypertension. EXAM: CHEST  2 VIEW COMPARISON:  03/05/2012; 02/13/2012 FINDINGS: Grossly unchanged cardiac silhouette and mediastinal contours with atherosclerotic plaque within the thoracic aorta. The lungs remain hyperexpanded. No focal airspace opacities. No pleural effusion or pneumothorax. No evidence of edema. No acute osseous abnormalities. Stigmata DISH within the thoracic spine. Enthesopathic change involving the right rotator cuff insertion site, incompletely evaluated. IMPRESSION: 1. Hyperexpanded lungs without acute cardiopulmonary disease. 2.  Aortic Atherosclerosis (ICD10-170.0) Electronically Signed   By: Sandi Mariscal M.D.   On: 01/15/2016 23:04  Ct Abdomen Pelvis W Contrast  Result Date: 01/16/2016 CLINICAL DATA:  75 year old male with weakness and  loss of appetite. Heartburn, nausea vomiting. EXAM: CT ABDOMEN AND PELVIS WITH CONTRAST TECHNIQUE: Multidetector CT imaging of the abdomen and pelvis was performed using the standard protocol following bolus administration of intravenous contrast. CONTRAST:  159mL ISOVUE-300 IOPAMIDOL (ISOVUE-300) INJECTION 61% COMPARISON:  CT dated 03/07/2012 FINDINGS: The visualized lung bases are clear. No intra-abdominal free air or free fluid. Diffuse fatty infiltration of the liver. Multiple hepatic hypodense lesions measuring up to 3.1 x 2.7 cm in the left lobe of the liver have increased in size compared to the prior study. These lesions are incompletely characterized but likely represent cysts or hemangioma. Small amount of sludge or small stones noted layering within the gallbladder. No pericholecystic fluid. The pancreas, spleen, adrenal glands appear unremarkable. There is a 1.5 cm left renal interpolar parapelvic cyst. Small bilateral renal hypodensities likely represent cysts. There is no hydronephrosis on either side. The visualized ureters and urinary bladder appear unremarkable. The prostate gland is enlarged measuring 5 cm in diameter. The seminal vesicles are grossly unremarkable. There is a small hiatal hernia with gastroesophageal reflux. There is diffuse circumferential thickening of the distal esophagus with stranding of the adjacent fat compatible with esophagitis. Findings may be related to recurrent reflux. Esophageal mass is less likely but not excluded. Only the distal portion of the esophagus is included in this study. There is colonic diverticulosis without active inflammatory changes. Moderate stool noted throughout the colon. No evidence of bowel obstruction. Normal appendix. The abdominal aorta and IVC appear unremarkable. The origins of the celiac axis, SMA, IMA as well as the origins of the renal arteries are patent. 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 The SMV, splenic vein, and main portal vein are patent. No portal venous gas identified. There is no adenopathy. There is a small fat containing umbilical hernia. The abdominal wall soft tissues are otherwise unremarkable. There is multilevel degenerative changes of the spine with osteophyte formation. L3-L4 and L4-5 disc desiccation with vacuum phenomena. No acute fracture. IMPRESSION: Small hiatal hernia with findings of reflux esophagitis. Clinical correlation is recommended. No bowel obstruction. Normal appendix. Colonic diverticulosis. Electronically  Signed   By: Anner Crete M.D.   On: 01/16/2016 06:12   Procedures Procedures (including critical care time)  Medications Ordered in ED Medications  0.9 %  sodium chloride infusion (not administered)    Followed by  0.9 %  sodium chloride infusion (not administered)  gi cocktail (Maalox,Lidocaine,Donnatal) (not administered)  potassium chloride SA (K-DUR,KLOR-CON) CR tablet 40 mEq (not administered)  levETIRAcetam (KEPPRA) tablet 500 mg (not administered)     Initial Impression / Assessment and Plan / ED Course  I have reviewed the triage vital signs and the nursing notes.  COORDINATION OF CARE: 3:02 AM Discussed treatment plan which includes lab work, CXR, EKG, IVF, and a GI cocktail with pt at bedside and pt agreed to plan.  Pertinent labs & imaging results that were available during my care of the patient were reviewed by me and considered in my medical decision making (see chart for details).  Clinical Course    Difficulty swallowing-especially solid foods. This is worrisome for either stricture or mass. He does have history of alcohol abuse but apparently has not been drinking recently. Laboratory workup shows mild anemia which is essentially at his baseline, and mild hypokalemia. Minimal elevation of AST is not felt to be clinically significant, probably related to his history of alcohol  abuse. Orthostatic vital signs were obtained after IV hydration and still had significant rise in his heart rate. He was very unsteady on his feet. He will probably need additional IV hydration. He also will need GI evaluation for possible endoscopy to evaluate his swallowing difficulty. Case is discussed with Tye Savoy of [facility] hospital[facility] who agrees to admit the patient under observation status.  Final Clinical Impressions(s) / ED Diagnoses   Final diagnoses:  Orthostatic hypotension  Dysphagia  Elevated transaminase level  Hypokalemia  Normochromic normocytic anemia    New Prescriptions New Prescriptions   No medications on file   I personally performed the services described in this documentation, which was scribed in my presence. The recorded information has been reviewed and is accurate.       Joseph Fuel, MD [id number]

## 2016-01-16 NOTE — H&P (Signed)
History and Physical    Joseph Copeland [id number] DOB: September 12, 1940 DOA: 01/16/2016  PCP: Henrine Screws, MD  Patient coming from:  Nursing  Home    Chief Complaint:  weight loss, swallowing difficulties  HPI: Joseph Copeland is a 75 y.o. male with a medical history significant for, but not  limited to, seizure disorder, chronic low back pain , DM2, PEA arrest . Patient brought to ED for evaluation of weakness, weight loss and seveal month history of solid food dysphagia. Patient states he is down 20 pounds snce December. He as eating normal in December then began to notice that food was going down slowly. Nothing ever got stuck in his esophagus, just moved slowly through. Liquids are okay. About 10 days ago patient nearly stopped eating. He has reflux, worse in reclined position. He has associated heartburn at times and takes Mylanta or Tums. For several days he has vomited in the am and sometimes during the night. Emesis mainly previously consumed mild.  No urinary discomfort. BMs less frequent given poor intake.   Unsteady on feet last several days. Lightheaded when ambulating. Patient describes EGD approx 18 months ago but not sure why it was done. He wasn't having any swallowing problems during time of EGD.   ED Course:  Afeb, HR 118 >>>105. Resp 26. BP 1412/107 >>>>125/75. 02 sats 100% on room air.  No acute findings on cxr.  ABD/Pel CT - suggests esophagitis U/A - a few bacteria, + hyaline casts, neg leuk, neg nitrites.   Review of Systems: As per HPI, otherwise 10 point review of systems negative.    Past Medical History:  Diagnosis Date  . Arthritis   . Diabetes mellitus    type 2-no meds  . Glaucoma   . Hypertension   . Low testosterone   . Seizures ([place])    first12/12-none since  . Sleep apnea    nasal CPAP at HS    Past Surgical History:  Procedure Laterality Date  . COLONOSCOPY    . hemrhoidectomy  1988  . LIPOSUCTION  10/07/2011   Procedure: LIPOSUCTION;   Surgeon: Cristine Polio, MD;  Location: [place];  Service: Plastics;  Laterality: Left;  Marland Kitchen MASS EXCISION  10/07/2011   Procedure: EXCISION MASS;  Surgeon: Cristine Polio, MD;  Location: [place];  Service: Plastics;  Laterality: Left;  excision of large mass on left back with possible lipo assistence    Social History   Social History  . Marital status: Married    Spouse name: Mickel Baas   . Number of children: 2  . Years of education: Masters   Occupational History  .  [employer]  . Retired     Social History Main Topics  . Smoking status: Former Smoker    Quit date: 10/03/1966  . Smokeless tobacco: Never Used     Comment: quit smoking 1971  . Alcohol use 12.6 oz/week    21 Shots of liquor per week     Comment: occ  . Drug use: No  . Sexual activity: Not Currently   Other Topics Concern  . Not on file   Social History Narrative   01/15/2013 AHW  "[name]" was born and grew up in [place], [place]. He has one older brother, and 2 sisters, one older and one younger. He reports that his childhood was "bleak," and states that he will was very poor. He graduated from high school, and achieved a BS in Astronomer from [employer] in 1965. 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 He also served several years in the [place], and was stationed in [place], and saw 5 combat in [place]. He is currently married to his wife for 52 years, and they have 2 daughters. He denies any legal problems. His hobbies include reading and listening to jazz. He affiliates as a Nurse, learning disability. He reports his social support system consists of his oldest daughter, his brother, and his wife. 01/15/2013 AHW   Lives at home with wife. No assistive devices needed for ambulation.   No Known Allergies  Family History  Problem Relation Age of Onset  . Alcohol abuse Father   . Alcohol abuse Paternal Grandfather     . Prior to Admission medications   Medication Sig Start Date End Date Taking? Authorizing Provider  Cyanocobalamin (VITAMIN B 12 PO) Take 1 tablet by mouth daily.     Yes Historical Provider, MD  lactose free nutrition (BOOST) LIQD Take 237 mLs by mouth daily.   Yes Historical Provider, MD  levETIRAcetam (KEPPRA) 500 MG tablet Take 1 tablet (500 mg total) by mouth 2 (two) times daily. 11/28/15  Yes Dennie Bible, NP  metoprolol tartrate (LOPRESSOR) 25 MG tablet Take 25 mg by mouth daily.   Yes Historical Provider, MD  testosterone (ANDROGEL) 50 MG/5GM GEL Place 5 g onto the skin daily.   Yes Historical Provider, MD    Physical Exam: Vitals:   01/16/16 0430 01/16/16 0445 01/16/16 0515 01/16/16 0530  BP: (!) 127/103 138/74 124/70 125/75  Pulse: 103 101 115 105  Resp: 24 24 26 26   Temp:      TempSrc:      SpO2: 100% 100% 100% 99%  Height:        Constitutional:  Pleasant thin black male in NAD, calm, comfortable Vitals:   01/16/16 0430 01/16/16 0445 01/16/16 0515 01/16/16 0530  BP: (!) 127/103 138/74 124/70 125/75  Pulse: 103 101 115 105  Resp: 24 24 26 26   Temp:      TempSrc:      SpO2: 100% 100% 100% 99%  Height:       Eyes: PER, lids and conjunctivae normal ENMT: Mucous membranes are moist. Posterior pharynx clear of any exudate or lesions..  Neck: normal, supple, no masses Respiratory: clear to auscultation bilaterally, no wheezing, no crackles. Normal respiratory effort. No accessory muscle use.  Cardiovascular: Regular rate and rhythm, + murmur.  No extremity edema. 2+ pedal pulses.   Abdomen: no tenderness, no masses palpated. No hepatomegaly. Bowel sounds positive.  Musculoskeletal: no clubbing / cyanosis. No joint deformity upper and lower extremities. Good ROM, no contractures. Normal muscle tone.  Skin: no rashes, lesions, ulcers.  Neurologic: CN 2-12 grossly intact. Sensation intact, Strength 5/5 in all 4.  Psychiatric: Normal judgment and insight. Alert  and oriented x 3. Mood seems somewhat depressed.   Labs on Admission: I have personally reviewed following labs and imaging studies  Urine analysis:    Component Value Date/Time   COLORURINE AMBER (A) 01/16/2016 0513   APPEARANCEUR CLOUDY (A) 01/16/2016 0513   LABSPEC 1.026 01/16/2016 0513   PHURINE 6.0 01/16/2016 0513   GLUCOSEU NEGATIVE 01/16/2016 0513   HGBUR NEGATIVE 01/16/2016 0513   BILIRUBINUR MODERATE (A) 01/16/2016 0513   KETONESUR 40 (A) 01/16/2016 0513   PROTEINUR 30 (A) 01/16/2016 0513   UROBILINOGEN 0.2 07/27/2014 1916   NITRITE NEGATIVE 01/16/2016 0513   LEUKOCYTESUR  TRACE (A) 01/16/2016 0513    Radiological Exams on Admission: Dg Chest 2 View  Result Date: 01/15/2016 CLINICAL DATA:  Mid upper chest pain for the past 7-10 days. Vomiting. Decreased appetite. History of diabetes and hypertension. EXAM: CHEST  2 VIEW COMPARISON:  03/05/2012; 02/13/2012 FINDINGS: Grossly unchanged cardiac silhouette and mediastinal contours with atherosclerotic plaque within the thoracic aorta. The lungs remain hyperexpanded. No focal airspace opacities. No pleural effusion or pneumothorax. No evidence of edema. No acute osseous abnormalities. Stigmata DISH within the thoracic spine. Enthesopathic change involving the right rotator cuff insertion site, incompletely evaluated. IMPRESSION: 1. Hyperexpanded lungs without acute cardiopulmonary disease. 2.  Aortic Atherosclerosis (ICD10-170.0) Electronically Signed   By: Sandi Mariscal M.D.   On: 01/15/2016 23:04  Ct Abdomen Pelvis W Contrast  Result Date: 01/16/2016 CLINICAL DATA:  75 year old male with weakness and loss of appetite. Heartburn, nausea vomiting. EXAM: CT ABDOMEN AND PELVIS WITH CONTRAST TECHNIQUE: Multidetector CT imaging of the abdomen and pelvis was performed using the standard protocol following bolus administration of intravenous contrast. CONTRAST:  139mL ISOVUE-300 IOPAMIDOL (ISOVUE-300) INJECTION 61% COMPARISON:  CT dated  03/07/2012 FINDINGS: The visualized lung bases are clear. No intra-abdominal free air or free fluid. Diffuse fatty infiltration of the liver. Multiple hepatic hypodense lesions measuring up to 3.1 x 2.7 cm in the left lobe of the liver have increased in size compared to the prior study. These lesions are incompletely characterized but likely represent cysts or hemangioma. Small amount of sludge or small stones noted layering within the gallbladder. No pericholecystic fluid. The pancreas, spleen, adrenal glands appear unremarkable. There is a 1.5 cm left renal interpolar parapelvic cyst. Small bilateral renal hypodensities likely represent cysts. There is no hydronephrosis on either side. The visualized ureters and urinary bladder appear unremarkable. The prostate gland is enlarged measuring 5 cm in diameter. The seminal vesicles are grossly unremarkable. There is a small hiatal hernia with gastroesophageal reflux. There is diffuse circumferential thickening of the distal esophagus with stranding of the adjacent fat compatible with esophagitis. Findings may be related to recurrent reflux. Esophageal mass is less likely but not excluded. Only the distal portion of the esophagus is included in this study. There is colonic diverticulosis without active inflammatory changes. Moderate stool noted throughout the colon. No evidence of bowel obstruction. Normal appendix. The abdominal aorta and IVC appear unremarkable. The origins of the celiac axis, SMA, IMA as well as the origins of the renal arteries are patent. 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 The SMV, splenic vein, and main portal vein are patent. No portal venous gas identified. There is no adenopathy. There is a small fat containing umbilical hernia. The abdominal wall soft tissues are otherwise unremarkable. There is multilevel degenerative changes of the spine with osteophyte  formation. L3-L4 and L4-5 disc desiccation with vacuum phenomena. No acute fracture. IMPRESSION: Small hiatal hernia with findings of reflux esophagitis. Clinical correlation is recommended. No bowel obstruction. Normal appendix. Colonic diverticulosis. Electronically Signed   By: Anner Crete M.D.   On: 01/16/2016 06:12   EKG: Independently reviewed.   EKG Interpretation  Date/Time:  Monday January 15 2016 22:40:56 EDT Ventricular Rate:  116 PR Interval:  128 QRS Duration: 68 QT Interval:  304 QTC Calculation: 422 R Axis:   7 Text Interpretation:  Sinus tachycardia Otherwise normal ECG When compared with ECG of 03/05/2016 No significant change was found Confirmed by [facility]  MD, DAVID ([id number]) on 01/16/2016 2:53:51 AM       Assessment/Plan   Active Problems:   HTN (hypertension)   DM (diabetes mellitus), type 2 (HCC)   Anemia   Orthostatic hypotension   Dysphagia   Tachycardia   Weight loss   Seizure disorder (HCC)      Dysphagia, vomiting, weight loss. Outpatient weight 123 pounds on 11/28/15, today's weight is pending. Interestingly, his albumin is normal. Etiology of symptoms : Stricture? Mass? Dysmotility? Element of delayed gastric emptying?  Distal esoph thickening on a CTscan of abd/pelvis.             -Place in Obs - medical bed -Needs further evaluation of esophagus. Esophagram to evaluate for strictures could be done but will await GI evaluation as they may prefer to move on with EGD instead. Known to [facility], will call for consult  Orthostatic hypotension. Though BP drop doesn't really orthostatic criteria, he heart rate does with > 20 point rise with standing.     -Got a liter of fluid in ED. Continue NS at 150 ml/h until 3pm as ordered in EDP. -repeat orthostatic vitals Q shift .  Sinus tachycardia, improving.  Monitor on telemetry but suspect will continue to improve with rehydration.  -resume home beta blocker  DM2, states diet controlled.  -A1c -cbgs,  sensitive SSI  Normocytic anemia, mild. Hgb 12.5 which has been hs baseline except for hgb of 15.4 in Feb 2016.   Multiple, enlarging hypodense liver lesions on CTscan. May need MRI in future to better characterize  Seizure disorder. Denies recent seizures.  -continue home seizure medications  ? COPD, hyperexpanded lungs on CTscan. Remote smoking history.  Hx of ETOH abuse, supposedly doesn't drink much now but AST / ALT ratio suspicious. -check INR -CIWA  Mild hyperbilirubinemia, predominantly direct speaking against obstructive process.    DVT prophylaxis:   Lovenox  Code Status:   Full code Family Communication:    Treatment plan discussed with  wife and daughter in the room and they both understand and agree with the plan..  Disposition Plan: Discharge home  in 24-48 hours              Consults called:  [facility]   Admission status:  Observation -Telemetry   Tye Savoy NP [facility] Pager [phone]  If 7PM-7AM, please contact night-coverage [email or web address] Password [facility]  01/16/2016, 8:19 AM

## 2016-01-17 ENCOUNTER — Encounter (HOSPITAL_COMMUNITY)
Admission: EM | Disposition: A | Discharge status: Home / Self Care | Payer: Self-pay | Source: Home / Self Care | Attending: Internal Medicine

## 2016-01-17 ENCOUNTER — Observation Stay (HOSPITAL_COMMUNITY): Payer: Medicare Other | Admitting: Anesthesiology

## 2016-01-17 ENCOUNTER — Encounter (HOSPITAL_COMMUNITY): Payer: Self-pay | Admitting: Certified Registered"

## 2016-01-17 DIAGNOSIS — R634 Abnormal weight loss: Secondary | ICD-10-CM

## 2016-01-17 DIAGNOSIS — G4733 Obstructive sleep apnea (adult) (pediatric): Secondary | ICD-10-CM | POA: Diagnosis present

## 2016-01-17 DIAGNOSIS — R740 Nonspecific elevation of levels of transaminase and lactic acid dehydrogenase [LDH]: Secondary | ICD-10-CM | POA: Diagnosis present

## 2016-01-17 DIAGNOSIS — H409 Unspecified glaucoma: Secondary | ICD-10-CM | POA: Diagnosis present

## 2016-01-17 DIAGNOSIS — D649 Anemia, unspecified: Secondary | ICD-10-CM

## 2016-01-17 DIAGNOSIS — K210 Gastro-esophageal reflux disease with esophagitis: Secondary | ICD-10-CM | POA: Diagnosis present

## 2016-01-17 DIAGNOSIS — R17 Unspecified jaundice: Secondary | ICD-10-CM | POA: Diagnosis present

## 2016-01-17 DIAGNOSIS — E440 Moderate protein-calorie malnutrition: Secondary | ICD-10-CM | POA: Diagnosis present

## 2016-01-17 DIAGNOSIS — R1310 Dysphagia, unspecified: Secondary | ICD-10-CM | POA: Diagnosis present

## 2016-01-17 DIAGNOSIS — Z6821 Body mass index (BMI) 21.0-21.9, adult: Secondary | ICD-10-CM | POA: Diagnosis not present

## 2016-01-17 DIAGNOSIS — G40909 Epilepsy, unspecified, not intractable, without status epilepticus: Secondary | ICD-10-CM

## 2016-01-17 DIAGNOSIS — R000 Tachycardia, unspecified: Secondary | ICD-10-CM

## 2016-01-17 DIAGNOSIS — Z87891 Personal history of nicotine dependence: Secondary | ICD-10-CM | POA: Diagnosis not present

## 2016-01-17 DIAGNOSIS — F1020 Alcohol dependence, uncomplicated: Secondary | ICD-10-CM | POA: Diagnosis present

## 2016-01-17 DIAGNOSIS — K2210 Ulcer of esophagus without bleeding: Secondary | ICD-10-CM | POA: Diagnosis present

## 2016-01-17 DIAGNOSIS — E876 Hypokalemia: Secondary | ICD-10-CM

## 2016-01-17 DIAGNOSIS — I10 Essential (primary) hypertension: Secondary | ICD-10-CM | POA: Diagnosis present

## 2016-01-17 DIAGNOSIS — I951 Orthostatic hypotension: Secondary | ICD-10-CM | POA: Diagnosis not present

## 2016-01-17 DIAGNOSIS — R531 Weakness: Secondary | ICD-10-CM | POA: Diagnosis present

## 2016-01-17 DIAGNOSIS — E119 Type 2 diabetes mellitus without complications: Secondary | ICD-10-CM | POA: Diagnosis present

## 2016-01-17 DIAGNOSIS — R42 Dizziness and giddiness: Secondary | ICD-10-CM | POA: Diagnosis present

## 2016-01-17 DIAGNOSIS — J449 Chronic obstructive pulmonary disease, unspecified: Secondary | ICD-10-CM | POA: Diagnosis present

## 2016-01-17 DIAGNOSIS — K2950 Unspecified chronic gastritis without bleeding: Secondary | ICD-10-CM | POA: Diagnosis present

## 2016-01-17 DIAGNOSIS — E860 Dehydration: Secondary | ICD-10-CM | POA: Diagnosis present

## 2016-01-17 DIAGNOSIS — K449 Diaphragmatic hernia without obstruction or gangrene: Secondary | ICD-10-CM | POA: Diagnosis present

## 2016-01-17 DIAGNOSIS — D6489 Other specified anemias: Secondary | ICD-10-CM | POA: Diagnosis present

## 2016-01-17 DIAGNOSIS — E118 Type 2 diabetes mellitus with unspecified complications: Secondary | ICD-10-CM | POA: Diagnosis not present

## 2016-01-17 DIAGNOSIS — K259 Gastric ulcer, unspecified as acute or chronic, without hemorrhage or perforation: Secondary | ICD-10-CM | POA: Diagnosis present

## 2016-01-17 HISTORY — PX: ESOPHAGOGASTRODUODENOSCOPY: SHX5428

## 2016-01-17 LAB — BASIC METABOLIC PANEL
ANION GAP: 5 (ref 5–15)
BUN: 10 mg/dL (ref 6–20)
CALCIUM: 8.7 mg/dL — AB (ref 8.9–10.3)
CO2: 23 mmol/L (ref 22–32)
Chloride: 107 mmol/L (ref 101–111)
Creatinine, Ser: 0.68 mg/dL (ref 0.61–1.24)
GFR calc Af Amer: >60 mL/min (ref >60)
Glucose, Bld: 112 mg/dL — ABNORMAL HIGH (ref 65–99)
POTASSIUM: 2.6 mmol/L — AB (ref 3.5–5.1)
SODIUM: 135 mmol/L (ref 135–145)

## 2016-01-17 LAB — GLUCOSE, CAPILLARY
GLUCOSE-CAPILLARY: 113 mg/dL — AB (ref 65–99)
GLUCOSE-CAPILLARY: 140 mg/dL — AB (ref 65–99)
Glucose-Capillary: 118 mg/dL — ABNORMAL HIGH (ref 65–99)
Glucose-Capillary: 115 mg/dL — ABNORMAL HIGH (ref 65–99)

## 2016-01-17 LAB — CBC
HEMATOCRIT: 28.5 % — AB (ref 39.0–52.0)
Hemoglobin: 9 g/dL — ABNORMAL LOW (ref 13.0–17.0)
MCH: 28.8 pg (ref 26.0–34.0)
MCHC: 31.6 g/dL (ref 30.0–36.0)
MCV: 91.3 fL (ref 78.0–100.0)
PLATELETS: 145 10*3/uL — AB (ref 150–400)
RBC: 3.12 MIL/uL — ABNORMAL LOW (ref 4.22–5.81)
RDW: 17.7 % — AB (ref 11.5–15.5)
WBC: 8.9 10*3/uL (ref 4.0–10.5)

## 2016-01-17 LAB — POCT I-STAT, CHEM 8
BUN: 10 mg/dL (ref 6–20)
CALCIUM ION: 1.21 mmol/L (ref 1.12–1.23)
CHLORIDE: 104 mmol/L (ref 101–111)
Creatinine, Ser: 0.6 mg/dL — ABNORMAL LOW (ref 0.61–1.24)
Glucose, Bld: 109 mg/dL — ABNORMAL HIGH (ref 65–99)
HCT: 31 % — ABNORMAL LOW (ref 39.0–52.0)
Hemoglobin: 10.5 g/dL — ABNORMAL LOW (ref 13.0–17.0)
POTASSIUM: 3.1 mmol/L — AB (ref 3.5–5.1)
SODIUM: 139 mmol/L (ref 135–145)
TCO2: 23 mmol/L (ref 0–100)

## 2016-01-17 LAB — HEMOGLOBIN A1C
Hgb A1c MFr Bld: 5.7 % — ABNORMAL HIGH (ref 4.8–5.6)
MEAN PLASMA GLUCOSE: 117 mg/dL

## 2016-01-17 LAB — MAGNESIUM: MAGNESIUM: 1.3 mg/dL — AB (ref 1.7–2.4)

## 2016-01-17 SURGERY — EGD (ESOPHAGOGASTRODUODENOSCOPY)
Anesthesia: Monitor Anesthesia Care

## 2016-01-17 SURGERY — EGD (ESOPHAGOGASTRODUODENOSCOPY)
Anesthesia: Moderate Sedation

## 2016-01-17 MED ORDER — POTASSIUM CHLORIDE 10 MEQ/100ML IV SOLN
10.0000 meq | INTRAVENOUS | Status: AC
  Administered 2016-01-17 (×2): 10 meq via INTRAVENOUS
  Filled 2016-01-17 (×2): qty 100

## 2016-01-17 MED ORDER — POTASSIUM CHLORIDE 10 MEQ/100ML IV SOLN
10.0000 meq | INTRAVENOUS | Status: AC
  Administered 2016-01-17 (×5): 10 meq via INTRAVENOUS
  Filled 2016-01-17 (×5): qty 100

## 2016-01-17 MED ORDER — MAGNESIUM SULFATE 2 GM/50ML IV SOLN
2.0000 g | Freq: Once | INTRAVENOUS | Status: AC
  Administered 2016-01-17: 2 g via INTRAVENOUS
  Filled 2016-01-17: qty 50

## 2016-01-17 MED ORDER — SODIUM CHLORIDE 0.9 % IV SOLN
8.0000 mg/h | INTRAVENOUS | Status: AC
  Administered 2016-01-17 – 2016-01-20 (×5): 8 mg/h via INTRAVENOUS
  Filled 2016-01-17 (×14): qty 80

## 2016-01-17 MED ORDER — FENTANYL CITRATE (PF) 100 MCG/2ML IJ SOLN: 25.0000 ug | INTRAMUSCULAR | Status: DC | PRN

## 2016-01-17 MED ORDER — PROPOFOL 500 MG/50ML IV EMUL
INTRAVENOUS | Status: DC | PRN
  Administered 2016-01-17: 200 ug/kg/min via INTRAVENOUS

## 2016-01-17 MED ORDER — PANTOPRAZOLE SODIUM 40 MG IV SOLR
40.0000 mg | Freq: Two times a day (BID) | INTRAVENOUS | Status: DC
  Administered 2016-01-20: 40 mg via INTRAVENOUS
  Filled 2016-01-17: qty 40

## 2016-01-17 NOTE — Transfer of Care (Signed)
Immediate Anesthesia Transfer of Care Note  Patient: Joseph Copeland  Procedure(s) Performed: Procedure(s): ESOPHAGOGASTRODUODENOSCOPY (EGD) (N/A)  Patient Location: Endoscopy Unit  Anesthesia Type:MAC  Level of Consciousness: awake, alert , oriented and patient cooperative  Airway & Oxygen Therapy: Patient Spontanous Breathing and Patient connected to nasal cannula oxygen  Post-op Assessment: Report given to RN, Post -op Vital signs reviewed and stable and Patient moving all extremities  Post vital signs: Reviewed and stable  Last Vitals:  Vitals:   01/17/16 0421 01/17/16 0815  BP: 120/70 129/73  Pulse: 86 82  Resp: 16 20  Temp: 36.9 C 36.9 C    Last Pain:  Vitals:   01/17/16 0815  TempSrc: Oral  PainSc:          Complications: No apparent anesthesia complications

## 2016-01-17 NOTE — Brief Op Note (Addendum)
Severe ulcerative esophagitis with nodular GEJ - s/p biopsies. Antral gastritis with small ulceration - s/p biopsies. (see procedure report for details/recs). Changed to Protonix infusion. Needs to avoid all alcohol products and NSAIDs.

## 2016-01-17 NOTE — Anesthesia Postprocedure Evaluation (Signed)
Anesthesia Post Note  Patient: [name]  Procedure(s) Performed: Procedure(s) (LRB): ESOPHAGOGASTRODUODENOSCOPY (EGD) (N/A)  Patient location during evaluation: PACU Anesthesia Type: MAC Level of consciousness: awake and alert Pain management: pain level controlled Vital Signs Assessment: post-procedure vital signs reviewed and stable Respiratory status: spontaneous breathing, nonlabored ventilation, respiratory function stable and patient connected to nasal cannula oxygen Cardiovascular status: stable and blood pressure returned to baseline Anesthetic complications: no    Last Vitals:  Vitals:   01/17/16 0930 01/17/16 0940  BP: 134/71 (!) 116/96  Pulse: 77 70  Resp: 19 16  Temp:      Last Pain:  Vitals:   01/17/16 0922  TempSrc: Oral  PainSc:                  Aaran Enberg,W. EDMOND

## 2016-01-17 NOTE — Op Note (Signed)
[facility] Patient Name: Joseph Copeland Procedure Date : 01/17/2016 MRN: [id number] Attending MD: Lear Ng , MD Date of Birth: 02/21/1941 CSN: [id number] Age: 75 Admit Type: Inpatient Procedure:                Upper GI endoscopy Indications:              Dysphagia, Weight loss Providers:                Lear Ng, MD, Dortha Schwalbe RN, RN,                            Despina Pole Tech, Technician, Lara Mulch, CRNA Referring MD:              Medicines:                Propofol per Anesthesia, Monitored Anesthesia Care Complications:            No immediate complications. Estimated Blood Loss:     Estimated blood loss was minimal. Procedure:                Pre-Anesthesia Assessment:                           - Prior to the procedure, a History and Physical                            was performed, and patient medications and                            allergies were reviewed. The patient's tolerance of                            previous anesthesia was also reviewed. The risks                            and benefits of the procedure and the sedation                            options and risks were discussed with the patient.                            All questions were answered, and informed consent                            was obtained. Prior Anticoagulants: The patient has                            taken no previous anticoagulant or antiplatelet                            agents. ASA Grade Assessment: III - A patient with                            severe systemic disease. After reviewing the risks  and benefits, the patient was deemed in                            satisfactory condition to undergo the procedure.                           After obtaining informed consent, the endoscope was                            passed under direct vision. Throughout the                            procedure, the patient's blood  pressure, pulse, and                            oxygen saturations were monitored continuously. The                            was introduced through the mouth, and advanced to                            the second part of duodenum. The upper GI endoscopy                            was accomplished without difficulty. The patient                            tolerated the procedure well. Scope In: Scope Out: Findings:      LA Grade D (one or more mucosal breaks involving at least 75% of       esophageal circumference) ulcerative esophagitis with no bleeding was       found in the lower third of the esophagus.      The Z-line was irregular and was found 35 cm from the incisors. Biopsies       were taken with a cold forceps for histology. Estimated blood loss was       minimal.      Segmental moderate mucosal changes characterized by congestion,       nodularity and ulceration were found at the gastroesophageal junction.       Biopsies were taken with a cold forceps for histology. Estimated blood       loss was minimal.      Segmental moderate mucosal changes characterized by congestion,       erythema, erosion and ulceration were found in the gastric antrum.       Biopsies were taken with a cold forceps for histology. Estimated blood       loss was minimal.      A small hiatal hernia was present.      The examined duodenum was normal. Impression:               - LA Grade D esophagitis.                           - Z-line irregular, 35 cm from the incisors.  Biopsied.                           - Congested, nodular, ulcerated mucosa in the                            esophagus. Biopsied.                           - Congested, erythematous, eroded and ulcerated                            mucosa in the antrum. Biopsied.                           - Small hiatal hernia.                           - Normal examined duodenum. Moderate Sedation:      N/A - MAC  procedure Recommendation:           - Await pathology results.                           - Give Protonix (pantoprazole): 8 mg/hr IV by                            continuous infusion.                           - Clear liquid diet.                           - Consider Carafate slurry when tolerating liquids.                           - Post procedure medication orders were given. Procedure Code(s):        --- Professional ---                           [phone], Esophagogastroduodenoscopy, flexible,                            transoral; with biopsy, single or multiple Diagnosis Code(s):        --- Professional ---                           R13.10, Dysphagia, unspecified                           K25.9, Gastric ulcer, unspecified as acute or                            chronic, without hemorrhage or perforation                           K20.9, Esophagitis, unspecified  R63.4, Abnormal weight loss                           K22.10, Ulcer of esophagus without bleeding                           K22.8, Other specified diseases of esophagus                           K31.89, Other diseases of stomach and duodenum                           K44.9, Diaphragmatic hernia without obstruction or                            gangrene CPT copyright 2016 [employer]. All rights reserved. The codes documented in this report are preliminary and upon coder review may  be revised to meet current compliance requirements. Lear Ng, MD 01/17/2016 9:26:33 AM This report has been signed electronically. Number of Addenda: 0

## 2016-01-17 NOTE — Progress Notes (Signed)
PROGRESS NOTE    Joseph Copeland  [id number] DOB: 12/04/1940 DOA: 01/16/2016 PCP: Henrine Screws, MD   Brief Narrative:  HPI on 01/16/2016 by Ms. Tye Savoy Rolen Ose is a 75 y.o. male with a medical history significant for, but not  limited to, seizure disorder, chronic low back pain , DM2, PEA arrest . Patient brought to ED for evaluation of weakness, weight loss and seveal month history of solid food dysphagia. Patient states he is down 20 pounds snce December. He as eating normal in December then began to notice that food was going down slowly. Nothing ever got stuck in his esophagus, just moved slowly through. Liquids are okay. About 10 days ago patient nearly stopped eating. He has reflux, worse in reclined position. He has associated heartburn at times and takes Mylanta or Tums. For several days he has vomited in the am and sometimes during the night. Emesis mainly previously consumed mild.  No urinary discomfort. BMs less frequent given poor intake.   Unsteady on feet last several days. Lightheaded when ambulating. Patient describes EGD approx 18 months ago but not sure why it was done. He wasn't having any swallowing problems during time of EGD.  Assessment & Plan   Dysphagia, vomiting, weight loss -Outpatient weight 123 pounds on 11/28/15, today's weight is 122.  -CT abd/pelvis: Small hiatal hernia with reflux esophagitis -Gastroenterology consulted and appreciated -EGD 01/17/2016: L a grade D esophagitis, Z line irregular (biopsied) congested nodular ulcerated mucosa in the esophagus (biopsy), congested erythematous eroded and ulcerated mucosa in the antrum opened C biopsied).  -Recommendations were to continue Protonix, clear liquid diet, Carafate slurry, await pathology results  Orthostatic hypotension  -Vitals did not appear to coincide with true orthostasis  -Blood pressure appears to have improved    Sinus tachycardia -Resolved, possibly due to  dehydration -Continue metoprolol  Diabetes mellitus, type II -Hemoglobin A1c 5.7 -Continue insulin sliding scale CBG monitoring  Normocytic anemia -Hemoglobin baseline appears to be around 12, currently 10.5 -Monitor CBC closely (suspect drop in hemoglobin due to dilutional component)  Multiple, enlarging hypodense liver lesions  -Seen on CTscan. May need MRI in future to better characterize  Seizure disorder. -Stable, Denies recent seizures.  -continue home seizure medications  ? COPD, hyperexpanded lungs on CTscan. Remote smoking history.  Hx of ETOH abuse -INR 1.24 -continue CIWA  Mild hyperbilirubinemia -predominantly direct speaking against obstructive process.    Hypomagnesemia/hypokalemia -Will replace and continue to monitor  DVT Prophylaxis  Lovenox  Code Status: Full  Family Communication:  Wife at besdide  Disposition Plan: Admitted for observation  Consultants Gastroenterology  Procedures  EGD  Antibiotics   Anti-infectives    None      Subjective:   [name] seen and examined today.  Feels sleepy after his procedure.  Also feels hungry. Denies nausea, vomiting, diarrhea, constipation, shortness of breath, chest pain, dizziness, headache.   Objective:   Vitals:   01/17/16 0922 01/17/16 0930 01/17/16 0940 01/17/16 1207  BP: (!) 132/94 134/71 (!) 116/96 117/66  Pulse: 83 77 70 80  Resp: 19 19 16    Temp: 98 F (36.7 C)   98.6 F (37 C)  TempSrc: Oral   Oral  SpO2: 100% 100% 100% 98%  Weight:      Height:        Intake/Output Summary (Last 24 hours) at 01/17/16 1322 Last data filed at 01/17/16 1300  Gross per 24 hour  Intake  1253 ml  Output              450 ml  Net              803 ml   Filed Weights   01/16/16 1037 01/17/16 0421  Weight: 51.6 kg (113 lb 11.2 oz) 55.4 kg (122 lb 3.2 oz)    Exam  General: Well developed,cachetic, NAD  HEENT: NCAT,  mucous membranes moist.   Cardiovascular: S1 S2  auscultated, no rubs, murmurs or gallops. Regular rate and rhythm.  Respiratory: Clear to auscultation bilaterally with equal chest rise  Abdomen: Soft, nontender, nondistended, + bowel sounds  Extremities: warm dry without cyanosis clubbing or edema  Neuro: AAOx3, nonfocal  Psych: Normal affect and demeanor with intact judgement and insight   Data Reviewed: I have personally reviewed following labs and imaging studies  CBC:  Recent Labs Lab 01/15/16 0256 01/15/16 2252 01/17/16 0251 01/17/16 0816  WBC  --  12.3* 8.9  --   NEUTROABS 10.7*  --   --   --   HGB  --  12.5* 9.0* 10.5*  HCT  --  38.3* 28.5* 31.0*  MCV  --  90.3 91.3  --   PLT  --  156 145*  --    Basic Metabolic Panel:  Recent Labs Lab 01/15/16 2252 01/17/16 0251 01/17/16 0816  NA 136 135 139  K 3.2* 2.6* 3.1*  CL 100* 107 104  CO2 21* 23  --   GLUCOSE 207* 112* 109*  BUN 24* 10 10  CREATININE 1.18 0.68 0.60*  CALCIUM 10.7* 8.7*  --   MG  --  1.3*  --    GFR: Estimated Creatinine Clearance: 63.5 mL/min (by C-G formula based on SCr of 0.8 mg/dL). Liver Function Tests:  Recent Labs Lab 01/15/16 0256  AST 54*  ALT 18  ALKPHOS 84  BILITOT 1.8*  PROT 8.9*  ALBUMIN 4.5   No results for input(s): LIPASE, AMYLASE in the last 168 hours. No results for input(s): AMMONIA in the last 168 hours. Coagulation Profile:  Recent Labs Lab 01/16/16 1130  INR 1.24   Cardiac Enzymes: No results for input(s): CKTOTAL, CKMB, CKMBINDEX, TROPONINI in the last 168 hours. BNP (last 3 results) No results for input(s): PROBNP in the last 8760 hours. HbA1C:  Recent Labs  01/16/16 0245  HGBA1C 5.7*   CBG:  Recent Labs Lab 01/16/16 1125 01/16/16 1556 01/16/16 2044 01/17/16 0619 01/17/16 1148  GLUCAP 138* 178* 113* 113* 140*   Lipid Profile: No results for input(s): CHOL, HDL, LDLCALC, TRIG, CHOLHDL, LDLDIRECT in the last 72 hours. Thyroid Function Tests: No results for input(s): TSH, T4TOTAL,  FREET4, T3FREE, THYROIDAB in the last 72 hours. Anemia Panel: No results for input(s): VITAMINB12, FOLATE, FERRITIN, TIBC, IRON, RETICCTPCT in the last 72 hours. Urine analysis:    Component Value Date/Time   COLORURINE AMBER (A) 01/16/2016 0513   APPEARANCEUR CLOUDY (A) 01/16/2016 0513   LABSPEC 1.026 01/16/2016 0513   PHURINE 6.0 01/16/2016 0513   GLUCOSEU NEGATIVE 01/16/2016 0513   HGBUR NEGATIVE 01/16/2016 0513   BILIRUBINUR MODERATE (A) 01/16/2016 0513   KETONESUR 40 (A) 01/16/2016 0513   PROTEINUR 30 (A) 01/16/2016 0513   UROBILINOGEN 0.2 07/27/2014 1916   NITRITE NEGATIVE 01/16/2016 0513   LEUKOCYTESUR TRACE (A) 01/16/2016 0513   Sepsis Labs: @LABRCNTIP (procalcitonin:4,lacticidven:4)  )No results found for this or any previous visit (from the past 240 hour(s)).    Radiology Studies: Dg Chest 2 View  Result Date: 01/15/2016 CLINICAL DATA:  Mid upper chest pain for the past 7-10 days. Vomiting. Decreased appetite. History of diabetes and hypertension. EXAM: CHEST  2 VIEW COMPARISON:  03/05/2012; 02/13/2012 FINDINGS: Grossly unchanged cardiac silhouette and mediastinal contours with atherosclerotic plaque within the thoracic aorta. The lungs remain hyperexpanded. No focal airspace opacities. No pleural effusion or pneumothorax. No evidence of edema. No acute osseous abnormalities. Stigmata DISH within the thoracic spine. Enthesopathic change involving the right rotator cuff insertion site, incompletely evaluated. IMPRESSION: 1. Hyperexpanded lungs without acute cardiopulmonary disease. 2.  Aortic Atherosclerosis (ICD10-170.0) Electronically Signed   By: Sandi Mariscal M.D.   On: 01/15/2016 23:04  Ct Abdomen Pelvis W Contrast  Result Date: 01/16/2016 CLINICAL DATA:  75 year old male with weakness and loss of appetite. Heartburn, nausea vomiting. EXAM: CT ABDOMEN AND PELVIS WITH CONTRAST TECHNIQUE: Multidetector CT imaging of the abdomen and pelvis was performed using the standard  protocol following bolus administration of intravenous contrast. CONTRAST:  128mL ISOVUE-300 IOPAMIDOL (ISOVUE-300) INJECTION 61% COMPARISON:  CT dated 03/07/2012 FINDINGS: The visualized lung bases are clear. No intra-abdominal free air or free fluid. Diffuse fatty infiltration of the liver. Multiple hepatic hypodense lesions measuring up to 3.1 x 2.7 cm in the left lobe of the liver have increased in size compared to the prior study. These lesions are incompletely characterized but likely represent cysts or hemangioma. Small amount of sludge or small stones noted layering within the gallbladder. No pericholecystic fluid. The pancreas, spleen, adrenal glands appear unremarkable. There is a 1.5 cm left renal interpolar parapelvic cyst. Small bilateral renal hypodensities likely represent cysts. There is no hydronephrosis on either side. The visualized ureters and urinary bladder appear unremarkable. The prostate gland is enlarged measuring 5 cm in diameter. The seminal vesicles are grossly unremarkable. There is a small hiatal hernia with gastroesophageal reflux. There is diffuse circumferential thickening of the distal esophagus with stranding of the adjacent fat compatible with esophagitis. Findings may be related to recurrent reflux. Esophageal mass is less likely but not excluded. Only the distal portion of the esophagus is included in this study. There is colonic diverticulosis without active inflammatory changes. Moderate stool noted throughout the colon. No evidence of bowel obstruction. Normal appendix. The abdominal aorta and IVC appear unremarkable. The origins of the celiac axis, SMA, IMA as well as the origins of the renal arteries are patent. 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 The SMV, splenic vein, and main portal vein are patent. No portal venous gas identified. There is no adenopathy. There is a small fat containing  umbilical hernia. The abdominal wall soft tissues are otherwise unremarkable. There is multilevel degenerative changes of the spine with osteophyte formation. L3-L4 and L4-5 disc desiccation with vacuum phenomena. No acute fracture. IMPRESSION: Small hiatal hernia with findings of reflux esophagitis. Clinical correlation is recommended. No bowel obstruction. Normal appendix. Colonic diverticulosis. Electronically Signed   By: Anner Crete M.D.   On: 01/16/2016 06:12    Scheduled Meds: . enoxaparin (LOVENOX) injection  40 mg Subcutaneous Daily  . folic acid  1 mg Oral Daily  . insulin aspart  0-9 Units Subcutaneous TID WC  . lactose free nutrition  237 mL Oral Daily  . levETIRAcetam  500 mg Oral BID  . metoprolol tartrate  25 mg Oral Daily  . multivitamin with minerals  1 tablet Oral Daily  . [START ON 01/20/2016] pantoprazole  40 mg Intravenous  Q12H  . potassium chloride  10 mEq Intravenous Q1 Hr x 2  . sodium chloride flush  3 mL Intravenous Q12H  . thiamine  100 mg Oral Daily   Or  . thiamine  100 mg Intravenous Daily   Continuous Infusions: . sodium chloride Stopped (01/16/16 0705)  . pantoprozole (PROTONIX) infusion 8 mg/hr (01/17/16 1045)     LOS: 0 days   Time Spent in minutes   30 minutes  Charels Stambaugh D.O. on 01/17/2016 at 1:22 PM  Between 7am to 7pm - Pager - [phone]  After 7pm go to [email or web address] - password [email or web address]  And look for the night coverage person covering for me after hours  [facility] Office  [phone]

## 2016-01-17 NOTE — H&P (View-Only) (Signed)
Referring Provider: Dr. Marily Memos Primary Care Physician:  Henrine Screws, MD Primary Gastroenterologist:  Althia Forts  Reason for Consultation:  Dysphagia  HPI: Joseph Copeland is a 75 y.o. male with multiple medical problems seen for a consult due to dysphagia. Has been having recurrent dysphagia to solids since fall 2016 stating that solid food is very slow to go down. No problems swallowing liquids. Can eat pureed type of foods and soft foods without difficulty. Denies any abdominal pain. Has occasional black vomitus and states it last occurred less than a week ago. Occasional black stools. Denies red blood vomitus and denies hematochezia. 25 pound unintentional weight loss since December 2016. CT scan showed distal circumferential esophageal thickening. Normal EGD in 2010. Colonoscopy in 2013 (Dr. Wynetta Emery) showed diffuse diverticulosis. Wife and daughter at bedside.  Past Medical History:  Diagnosis Date  . Arthritis   . Diabetes mellitus    type 2-no meds  . Glaucoma   . Hypertension   . Low testosterone   . Seizures (Phenix)    first12/12-none since  . Sleep apnea    nasal CPAP at HS    Past Surgical History:  Procedure Laterality Date  . COLONOSCOPY    . hemrhoidectomy  1988  . LIPOSUCTION  10/07/2011   Procedure: LIPOSUCTION;  Surgeon: Cristine Polio, MD;  Location: [facility];  Service: Plastics;  Laterality: Left;  Marland Kitchen MASS EXCISION  10/07/2011   Procedure: EXCISION MASS;  Surgeon: Cristine Polio, MD;  Location: [facility];  Service: Plastics;  Laterality: Left;  excision of large mass on left back with possible lipo assistence    Prior to Admission medications   Medication Sig Start Date End Date Taking? Authorizing Provider  Cyanocobalamin (VITAMIN B 12 PO) Take 1 tablet by mouth daily.     Yes Historical Provider, MD  lactose free nutrition (BOOST) LIQD Take 237 mLs by mouth daily.   Yes Historical Provider, MD  levETIRAcetam (KEPPRA) 500  MG tablet Take 1 tablet (500 mg total) by mouth 2 (two) times daily. 11/28/15  Yes Dennie Bible, NP  metoprolol tartrate (LOPRESSOR) 25 MG tablet Take 25 mg by mouth daily.   Yes Historical Provider, MD  testosterone (ANDROGEL) 50 MG/5GM GEL Place 5 g onto the skin daily.   Yes Historical Provider, MD    Scheduled Meds: . sodium chloride   Intravenous STAT  . enoxaparin (LOVENOX) injection  40 mg Subcutaneous Daily  . folic acid  1 mg Oral Daily  . insulin aspart  0-9 Units Subcutaneous TID WC  . lactose free nutrition  237 mL Oral Daily  . levETIRAcetam  500 mg Oral BID  . metoprolol tartrate  25 mg Oral Daily  . multivitamin with minerals  1 tablet Oral Daily  . pantoprazole  40 mg Oral Daily  . sodium chloride flush  3 mL Intravenous Q12H  . thiamine  100 mg Oral Daily   Or  . thiamine  100 mg Intravenous Daily   Continuous Infusions: . sodium chloride Stopped (01/16/16 0705)   PRN Meds:.acetaminophen **OR** acetaminophen, bisacodyl, LORazepam **OR** LORazepam, ondansetron **OR** ondansetron (ZOFRAN) IV, polyethylene glycol, traZODone  Allergies as of 01/15/2016  . (No Known Allergies)    Family History  Problem Relation Age of Onset  . Alcohol abuse Father   . Alcohol abuse Paternal Grandfather     Social History   Social History  . Marital status: Married    Spouse name: Mickel Baas   . Number of children: 2  .  Years of education: Masters   Occupational History  .  [employer]  . Retired     Social History Main Topics  . Smoking status: Former Smoker    Quit date: 10/03/1966  . Smokeless tobacco: Never Used     Comment: quit smoking 1971  . Alcohol use 12.6 oz/week    21 Shots of liquor per week     Comment: occ  . Drug use: No  . Sexual activity: Not Currently   Other Topics Concern  . Not on file   Social History Narrative   01/15/2013 AHW  "[name]" was born and grew up in [place], [place]. He has one older brother, and 2 sisters, one older and  one younger. He reports that his childhood was "bleak," and states that he will was very poor. He graduated from high school, and achieved a BS in Astronomer from [employer] in [place]. 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 He also served several years in the [place], and was stationed in [place], and saw 5 combat in [place]. He is currently married to his wife for 61 years, and they have 2 daughters. He denies any legal problems. His hobbies include reading and listening to jazz. He affiliates as a Nurse, learning disability. He reports his social support system consists of his oldest daughter, his brother, and his wife. 01/15/2013 AHW    Review of Systems: All negative except as stated above in HPI.  Physical Exam: Vital signs: Vitals:   01/16/16 0800 01/16/16 1552  BP: 117/75 (!) 95/52  Pulse: 100 92  Resp: 13 16  Temp:  99.3 F (37.4 C)   Last BM Date:  ("one week ago") General:  Lethargic, elderly, frail, pleasant and cooperative in NAD, thin HEENT: anicteric sclera, oropharynx clear Neck: supple, nontender Lungs:  Clear throughout to auscultation.   No wheezes, crackles, or rhonchi. No acute distress. Heart:  Regular rate and rhythm; no murmurs, clicks, rubs,  or gallops. Abdomen: soft, nontender, nondistended, +BS  Rectal:  Deferred Ext: no edema Skin: no jaundice  GI:  Lab Results:  Recent Labs  01/15/16 2252  WBC 12.3*  HGB 12.5*  HCT 38.3*  PLT 156   BMET  Recent Labs  01/15/16 2252  NA 136  K 3.2*  CL 100*  CO2 21*  GLUCOSE 207*  BUN 24*  CREATININE 1.18  CALCIUM 10.7*   LFT  Recent Labs  01/15/16 0256  PROT 8.9*  ALBUMIN 4.5  AST 54*  ALT 18  ALKPHOS 84  BILITOT 1.8*  BILIDIR 0.4  IBILI 1.4*   PT/INR  Recent Labs  01/16/16 1130  LABPROT 15.8*  INR 1.24     Studies/Results: Dg Chest 2 View  Result Date: 01/15/2016 CLINICAL DATA:  Mid upper chest pain for the past 7-10 days.  Vomiting. Decreased appetite. History of diabetes and hypertension. EXAM: CHEST  2 VIEW COMPARISON:  03/05/2012; 02/13/2012 FINDINGS: Grossly unchanged cardiac silhouette and mediastinal contours with atherosclerotic plaque within the thoracic aorta. The lungs remain hyperexpanded. No focal airspace opacities. No pleural effusion or pneumothorax. No evidence of edema. No acute osseous abnormalities. Stigmata DISH within the thoracic spine. Enthesopathic change involving the right rotator cuff insertion site, incompletely evaluated. IMPRESSION: 1. Hyperexpanded lungs without acute cardiopulmonary disease. 2.  Aortic Atherosclerosis (ICD10-170.0) Electronically Signed   By: Sandi Mariscal M.D.   On: 01/15/2016 23:04  Ct Abdomen Pelvis W Contrast  Result  Date: 01/16/2016 CLINICAL DATA:  75 year old male with weakness and loss of appetite. Heartburn, nausea vomiting. EXAM: CT ABDOMEN AND PELVIS WITH CONTRAST TECHNIQUE: Multidetector CT imaging of the abdomen and pelvis was performed using the standard protocol following bolus administration of intravenous contrast. CONTRAST:  195mL ISOVUE-300 IOPAMIDOL (ISOVUE-300) INJECTION 61% COMPARISON:  CT dated 03/07/2012 FINDINGS: The visualized lung bases are clear. No intra-abdominal free air or free fluid. Diffuse fatty infiltration of the liver. Multiple hepatic hypodense lesions measuring up to 3.1 x 2.7 cm in the left lobe of the liver have increased in size compared to the prior study. These lesions are incompletely characterized but likely represent cysts or hemangioma. Small amount of sludge or small stones noted layering within the gallbladder. No pericholecystic fluid. The pancreas, spleen, adrenal glands appear unremarkable. There is a 1.5 cm left renal interpolar parapelvic cyst. Small bilateral renal hypodensities likely represent cysts. There is no hydronephrosis on either side. The visualized ureters and urinary bladder appear unremarkable. The prostate gland is  enlarged measuring 5 cm in diameter. The seminal vesicles are grossly unremarkable. There is a small hiatal hernia with gastroesophageal reflux. There is diffuse circumferential thickening of the distal esophagus with stranding of the adjacent fat compatible with esophagitis. Findings may be related to recurrent reflux. Esophageal mass is less likely but not excluded. Only the distal portion of the esophagus is included in this study. There is colonic diverticulosis without active inflammatory changes. Moderate stool noted throughout the colon. No evidence of bowel obstruction. Normal appendix. The abdominal aorta and IVC appear unremarkable. The origins of the celiac axis, SMA, IMA as well as the origins of the renal arteries are patent. 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 The SMV, splenic vein, and main portal vein are patent. No portal venous gas identified. There is no adenopathy. There is a small fat containing umbilical hernia. The abdominal wall soft tissues are otherwise unremarkable. There is multilevel degenerative changes of the spine with osteophyte formation. L3-L4 and L4-5 disc desiccation with vacuum phenomena. No acute fracture. IMPRESSION: Small hiatal hernia with findings of reflux esophagitis. Clinical correlation is recommended. No bowel obstruction. Normal appendix. Colonic diverticulosis. Electronically Signed   By: Anner Crete M.D.   On: 01/16/2016 06:12   Impression/Plan: 75 yo with progressive solid food dysphagia and weight loss concerning for esophageal malignancy. Peptic stricture also possible but needs an EGD with possible dilation as the next step. Clear liquid diet. NPO p MN. EGD tomorrow morning. Risks/benefits discussed with patient and his wife and daughter and they agree to proceed.    LOS: 0 days   [place] C.  01/16/2016, 5:41 PM  Pager [phone]  If no answer or after 5  PM call [phone]

## 2016-01-17 NOTE — Interval H&P Note (Signed)
History and Physical Interval Note:  01/17/2016 8:46 AM  Joseph Copeland  has presented today for surgery, with the diagnosis of dysphagia  The various methods of treatment have been discussed with the patient and family. After consideration of risks, benefits and other options for treatment, the patient has consented to  Procedure(s): ESOPHAGOGASTRODUODENOSCOPY (EGD) (N/A) as a surgical intervention .  The patient's history has been reviewed, patient examined, no change in status, stable for surgery.  I have reviewed the patient's chart and labs.  Questions were answered to the patient's satisfaction.     [place] C.

## 2016-01-18 ENCOUNTER — Encounter (HOSPITAL_COMMUNITY)
Admission: EM | Disposition: A | Discharge status: Home / Self Care | Payer: Self-pay | Source: Home / Self Care | Attending: Internal Medicine

## 2016-01-18 LAB — FERRITIN: FERRITIN: 138 ng/mL (ref 24–336)

## 2016-01-18 LAB — IRON AND TIBC
IRON: 38 ug/dL — AB (ref 45–182)
SATURATION RATIOS: 15 % — AB (ref 17.9–39.5)
TIBC: 262 ug/dL (ref 250–450)
UIBC: 224 ug/dL

## 2016-01-18 LAB — BASIC METABOLIC PANEL
ANION GAP: 5 (ref 5–15)
BUN: <5 mg/dL — ABNORMAL LOW (ref 6–20)
CHLORIDE: 107 mmol/L (ref 101–111)
CO2: 25 mmol/L (ref 22–32)
Calcium: 8.4 mg/dL — ABNORMAL LOW (ref 8.9–10.3)
Creatinine, Ser: 0.74 mg/dL (ref 0.61–1.24)
GFR calc Af Amer: >60 mL/min (ref >60)
GFR calc non Af Amer: >60 mL/min (ref >60)
GLUCOSE: 123 mg/dL — AB (ref 65–99)
POTASSIUM: 3.4 mmol/L — AB (ref 3.5–5.1)
Sodium: 137 mmol/L (ref 135–145)

## 2016-01-18 LAB — GLUCOSE, CAPILLARY
GLUCOSE-CAPILLARY: 118 mg/dL — AB (ref 65–99)
GLUCOSE-CAPILLARY: 110 mg/dL — AB (ref 65–99)
Glucose-Capillary: 179 mg/dL — ABNORMAL HIGH (ref 65–99)
Glucose-Capillary: 112 mg/dL — ABNORMAL HIGH (ref 65–99)

## 2016-01-18 LAB — CBC
HEMATOCRIT: 27.5 % — AB (ref 39.0–52.0)
HEMOGLOBIN: 8.7 g/dL — AB (ref 13.0–17.0)
MCH: 29.3 pg (ref 26.0–34.0)
MCHC: 31.6 g/dL (ref 30.0–36.0)
MCV: 92.6 fL (ref 78.0–100.0)
Platelets: 192 10*3/uL (ref 150–400)
RBC: 2.97 MIL/uL — AB (ref 4.22–5.81)
RDW: 17.9 % — AB (ref 11.5–15.5)
WBC: 5.6 10*3/uL (ref 4.0–10.5)

## 2016-01-18 LAB — RETICULOCYTES
RBC.: 2.98 MIL/uL — ABNORMAL LOW (ref 4.22–5.81)
RETIC CT PCT: 3 % (ref 0.4–3.1)
Retic Count, Absolute: 89.4 10*3/uL (ref 19.0–186.0)

## 2016-01-18 LAB — FOLATE: Folate: 53.4 ng/mL (ref >5.9)

## 2016-01-18 LAB — VITAMIN B12: VITAMIN B 12: 1505 pg/mL — AB (ref 180–914)

## 2016-01-18 LAB — MAGNESIUM: Magnesium: 1.6 mg/dL — ABNORMAL LOW (ref 1.7–2.4)

## 2016-01-18 SURGERY — EGD (ESOPHAGOGASTRODUODENOSCOPY)
Anesthesia: Moderate Sedation

## 2016-01-18 MED ORDER — SUCRALFATE 1 GM/10ML PO SUSP
1.0000 g | Freq: Three times a day (TID) | ORAL | Status: DC
  Administered 2016-01-19 – 2016-01-21 (×10): 1 g via ORAL
  Filled 2016-01-18 (×9): qty 10

## 2016-01-18 MED ORDER — BOOST / RESOURCE BREEZE PO LIQD
1.0000 | Freq: Three times a day (TID) | ORAL | Status: DC
Start: 2016-01-18 — End: 2016-01-21
  Administered 2016-01-18 – 2016-01-21 (×9): 1 via ORAL

## 2016-01-18 MED ORDER — POTASSIUM CHLORIDE CRYS ER 20 MEQ PO TBCR
40.0000 meq | EXTENDED_RELEASE_TABLET | Freq: Once | ORAL | Status: AC
  Administered 2016-01-18: 40 meq via ORAL
  Filled 2016-01-18: qty 2

## 2016-01-18 MED ORDER — PRO-STAT SUGAR FREE PO LIQD
30.0000 mL | Freq: Two times a day (BID) | ORAL | Status: DC
  Administered 2016-01-18 – 2016-01-21 (×7): 30 mL via ORAL
  Filled 2016-01-18 (×6): qty 30

## 2016-01-18 MED ORDER — MAGNESIUM SULFATE 2 GM/50ML IV SOLN
2.0000 g | Freq: Once | INTRAVENOUS | Status: AC
  Administered 2016-01-18: 2 g via INTRAVENOUS
  Filled 2016-01-18: qty 50

## 2016-01-18 NOTE — Care Management Important Message (Signed)
Important Message  Patient Details  Name: Joseph Copeland MRN: [id number] Date of Birth: May 23, 1941   Medicare Important Message Given:  Yes    Loann Quill 01/18/2016, 11:45 AM

## 2016-01-18 NOTE — Progress Notes (Signed)
Initial Nutrition Assessment  DOCUMENTATION CODES:   Non-severe (moderate) malnutrition in context of chronic illness  INTERVENTION:  Diet advancement per MD Provide Boost Breeze po TID while on clear liquids, each supplement provides 250 kcal and 9 grams of protein Provide 30 ml of Pro-stat BID, each dose provides 15 grams of protein and 100 kcal Provide Ensure Enlive when diet is advanced Provide Multivitamin with minerals daily   NUTRITION DIAGNOSIS:   Malnutrition related to altered GI function as evidenced by energy intake < or equal to 75% for > or equal to 1 month, moderate depletions of muscle mass, moderate depletion of body fat.   GOAL:   Patient will meet greater than or equal to 90% of their needs   MONITOR:   PO intake, Diet advancement, Labs, Skin, I & O's  REASON FOR ASSESSMENT:   Malnutrition Screening Tool    ASSESSMENT:   75 y.o. male with a Past Medical History of poorly controlled diabetes, glaucoma, HTN, seizures, OSA who presents with dysphagia, orthostatic hypotension.   Pt reports having swallowing difficulty and a poor appetite for the past 7 months. He reports eating soft foods and liquids for several months and losing weight from 140 lbs to 120 lbs. PTA he was drinking Boost once daily, eating a small lunch of soup, and eating another small snack and milk in the evening. He reports early satiety; he was full after drinking some chicken broth and eating jello for breakfast. Pt has moderate muscle wasting and moderate fat wasting per nutrition-focused physical exam. RD emphasized the importance of nutrition. Encouraged eating 6 small meals instead of only 3. Encouraged increasing intake of nutritional supplements to prevent further weight loss. Pt currently on clear liquids. RD brought pt Boost Breeze which he tolerated well.   Labs: low magnesium, low potassium, low calcium, low hemoglobin  Diet Order:  Diet clear liquid Room service appropriate?  Yes; Fluid consistency: Thin  Skin:  Reviewed, no issues  Last BM:  7/26  Height:   Ht Readings from Last 1 Encounters:  01/16/16 5\' 3"  (1.6 m)    Weight:   Wt Readings from Last 1 Encounters:  01/18/16 123 lb (55.8 kg)    Ideal Body Weight:  56.4 kg  BMI:  Body mass index is 21.79 kg/m.  Estimated Nutritional Needs:   Kcal:  1600-1800  Protein:  70-80 grams  Fluid:  1.6-1.8 L/day  EDUCATION NEEDS:   No education needs identified at this time  [place], LDN Inpatient Clinical Dietitian Pager: [phone] After Hours Pager: [phone]

## 2016-01-18 NOTE — Progress Notes (Signed)
Patient ID: Joseph Copeland, male   DOB: Mar 22, 1941, 75 y.o.   MRN: [id number] [facility] Gastroenterology Progress Note  Joseph Copeland 75 y.o. [birth year]   Subjective: Feels ok. Tolerating clear liquids and wants to eat. Denies N/V/abdominal pain. Wife at bedside.  Objective: Vital signs in last 24 hours: Vitals:   01/17/16 2100 01/18/16 0504  BP: 116/68 119/70  Pulse: 88 80  Resp: 18 17  Temp: 98 F (36.7 C) 99.2 F (37.3 C)    Physical Exam: Gen: alert, no acute distress CV: RRR Chest: CTA B Abd: soft, nontender, nondistended, +BS  Lab Results:  Recent Labs  01/17/16 0251 01/17/16 0816 01/18/16 0341  NA 135 139 137  K 2.6* 3.1* 3.4*  CL 107 104 107  CO2 23  --  25  GLUCOSE 112* 109* 123*  BUN 10 10 <5*  CREATININE 0.68 0.60* 0.74  CALCIUM 8.7*  --  8.4*  MG 1.3*  --  1.6*   No results for input(s): AST, ALT, ALKPHOS, BILITOT, PROT, ALBUMIN in the last 72 hours.  Recent Labs  01/17/16 0251 01/17/16 0816 01/18/16 0341  WBC 8.9  --  5.6  HGB 9.0* 10.5* 8.7*  HCT 28.5* 31.0* 27.5*  MCV 91.3  --  92.6  PLT 145*  --  192    Recent Labs  01/16/16 1130  LABPROT 15.8*  INR 1.24      Assessment/Plan: Ulcerative esophagitis and gastritis - biopsies negative for malignancy (benign inflammation). No H. Pylori on gastric biopsies. Changed to full liquid diet and advance diet as tolerated. Start Carafate slurry when patient on soft foods. Supportive care.   [place] C. 01/18/2016, 2:37 PM  Pager [phone]  If no answer or after 5 PM call [phone]

## 2016-01-18 NOTE — Progress Notes (Signed)
PROGRESS NOTE    Joseph Copeland  [id number] DOB: 1941-05-26 DOA: 01/16/2016 PCP: Henrine Screws, MD   Brief Narrative:  HPI on 01/16/2016 by Ms. Tye Savoy Joseph Copeland is a 75 y.o. male with a medical history significant for, but not  limited to, seizure disorder, chronic low back pain , DM2, PEA arrest . Patient brought to ED for evaluation of weakness, weight loss and seveal month history of solid food dysphagia. Patient states he is down 20 pounds snce December. He as eating normal in December then began to notice that food was going down slowly. Nothing ever got stuck in his esophagus, just moved slowly through. Liquids are okay. About 10 days ago patient nearly stopped eating. He has reflux, worse in reclined position. He has associated heartburn at times and takes Mylanta or Tums. For several days he has vomited in the am and sometimes during the night. Emesis mainly previously consumed mild.  No urinary discomfort. BMs less frequent given poor intake.   Unsteady on feet last several days. Lightheaded when ambulating. Patient describes EGD approx 18 months ago but not sure why it was done. He wasn't having any swallowing problems during time of EGD.  Assessment & Plan   Dysphagia, vomiting, weight loss -Outpatient weight 123 pounds on 11/28/15, today's weight is 122.  -CT abd/pelvis: Small hiatal hernia with reflux esophagitis -Gastroenterology consulted and appreciated -EGD 01/17/2016: L a grade D esophagitis, Z line irregular (biopsied) congested nodular ulcerated mucosa in the esophagus (biopsy), congested erythematous eroded and ulcerated mucosa in the antrum opened C biopsied). Pathology pending -Recommendations were to continue Protonix, clear liquid diet, Carafate slurry, await pathology results -Patient tolerating clear liquid diet.  Orthostatic hypotension  -Vitals did not appear to coincide with true orthostasis  -Blood pressure appears to have improved    Sinus  tachycardia -Resolved, possibly due to dehydration -Continue metoprolol  Diabetes mellitus, type II -Hemoglobin A1c 5.7 -Continue insulin sliding scale CBG monitoring  Normocytic anemia -Hemoglobin baseline appears to be around 12, currently 8.7 -Monitor CBC closely (suspect drop in hemoglobin due to dilutional component) -Will obtain FOBT and anemia panel  Multiple, enlarging hypodense liver lesions  -Seen on CTscan. May need MRI in future to better characterize  Seizure disorder. -Stable, Denies recent seizures.  -continue home seizure medications  ? COPD, hyperexpanded lungs on CTscan. Remote smoking history.  Hx of ETOH abuse -INR 1.24 -continue CIWA  Mild hyperbilirubinemia -predominantly direct speaking against obstructive process.    Hypomagnesemia/hypokalemia -Will replace and continue to monitor  DVT Prophylaxis  Lovenox  Code Status: Full  Family Communication:  None at besdide  Disposition Plan: Admitted, pending further recommendations from GI and pathology results.  Consultants Gastroenterology  Procedures  EGD  Antibiotics   Anti-infectives    None      Subjective:   Joseph Copeland seen and examined today.  Complains of loose bowel movements. Feels better.  Denies nausea, vomiting, constipation, shortness of breath, chest pain, dizziness, headache.   Objective:   Vitals:   01/17/16 0940 01/17/16 1207 01/17/16 2100 01/18/16 0504  BP: (!) 116/96 117/66 116/68 119/70  Pulse: 70 80 88 80  Resp: 16  18 17   Temp:  98.6 F (37 C) 98 F (36.7 C) 99.2 F (37.3 C)  TempSrc:  Oral Oral Oral  SpO2: 100% 98% 100% 99%  Weight:    55.8 kg (123 lb)  Height:        Intake/Output Summary (Last 24 hours) at 01/18/16 1153 Last  data filed at 01/18/16 1003  Gross per 24 hour  Intake             1140 ml  Output              700 ml  Net              440 ml   Filed Weights   01/16/16 1037 01/17/16 0421 01/18/16 0504  Weight: 51.6 kg (113 lb  11.2 oz) 55.4 kg (122 lb 3.2 oz) 55.8 kg (123 lb)    Exam  General: Well developed,cachetic, NAD  HEENT: NCAT,  mucous membranes moist.   Cardiovascular: S1 S2 auscultated, no murmurs, RRR  Respiratory: Clear to auscultation bilaterally with equal chest rise  Abdomen: Soft, nontender, nondistended, + bowel sounds  Extremities: warm dry without cyanosis clubbing or edema  Neuro: AAOx3, nonfocal  Psych: Normal affect and demeanor with intact judgement and insight, pleasant   Data Reviewed: I have personally reviewed following labs and imaging studies  CBC:  Recent Labs Lab 01/15/16 0256 01/15/16 2252 01/17/16 0251 01/17/16 0816 01/18/16 0341  WBC  --  12.3* 8.9  --  5.6  NEUTROABS 10.7*  --   --   --   --   HGB  --  12.5* 9.0* 10.5* 8.7*  HCT  --  38.3* 28.5* 31.0* 27.5*  MCV  --  90.3 91.3  --  92.6  PLT  --  156 145*  --  [id number]   Basic Metabolic Panel:  Recent Labs Lab 01/15/16 2252 01/17/16 0251 01/17/16 0816 01/18/16 0341  NA 136 135 139 137  K 3.2* 2.6* 3.1* 3.4*  CL 100* 107 104 107  CO2 21* 23  --  25  GLUCOSE 207* 112* 109* 123*  BUN 24* 10 10 <5*  CREATININE 1.18 0.68 0.60* 0.74  CALCIUM 10.7* 8.7*  --  8.4*  MG  --  1.3*  --  1.6*   GFR: Estimated Creatinine Clearance: 63.9 mL/min (by C-G formula based on SCr of 0.8 mg/dL). Liver Function Tests:  Recent Labs Lab 01/15/16 0256  AST 54*  ALT 18  ALKPHOS 84  BILITOT 1.8*  PROT 8.9*  ALBUMIN 4.5   No results for input(s): LIPASE, AMYLASE in the last 168 hours. No results for input(s): AMMONIA in the last 168 hours. Coagulation Profile:  Recent Labs Lab 01/16/16 1130  INR 1.24   Cardiac Enzymes: No results for input(s): CKTOTAL, CKMB, CKMBINDEX, TROPONINI in the last 168 hours. BNP (last 3 results) No results for input(s): PROBNP in the last 8760 hours. HbA1C:  Recent Labs  01/16/16 0245  HGBA1C 5.7*   CBG:  Recent Labs Lab 01/17/16 1148 01/17/16 1612 01/17/16 2110  01/18/16 0639 01/18/16 1127  GLUCAP 140* 118* 115* 118* 179*   Lipid Profile: No results for input(s): CHOL, HDL, LDLCALC, TRIG, CHOLHDL, LDLDIRECT in the last 72 hours. Thyroid Function Tests: No results for input(s): TSH, T4TOTAL, FREET4, T3FREE, THYROIDAB in the last 72 hours. Anemia Panel: No results for input(s): VITAMINB12, FOLATE, FERRITIN, TIBC, IRON, RETICCTPCT in the last 72 hours. Urine analysis:    Component Value Date/Time   COLORURINE AMBER (A) 01/16/2016 0513   APPEARANCEUR CLOUDY (A) 01/16/2016 0513   LABSPEC 1.026 01/16/2016 0513   PHURINE 6.0 01/16/2016 0513   GLUCOSEU NEGATIVE 01/16/2016 0513   HGBUR NEGATIVE 01/16/2016 0513   BILIRUBINUR MODERATE (A) 01/16/2016 0513   KETONESUR 40 (A) 01/16/2016 0513   PROTEINUR 30 (A) 01/16/2016 0513   UROBILINOGEN  0.2 07/27/2014 1916   NITRITE NEGATIVE 01/16/2016 0513   LEUKOCYTESUR TRACE (A) 01/16/2016 0513   Sepsis Labs: @LABRCNTIP (procalcitonin:4,lacticidven:4)  )No results found for this or any previous visit (from the past 240 hour(s)).    Radiology Studies: No results found.   Scheduled Meds: . enoxaparin (LOVENOX) injection  40 mg Subcutaneous Daily  . folic acid  1 mg Oral Daily  . insulin aspart  0-9 Units Subcutaneous TID WC  . lactose free nutrition  237 mL Oral Daily  . levETIRAcetam  500 mg Oral BID  . metoprolol tartrate  25 mg Oral Daily  . multivitamin with minerals  1 tablet Oral Daily  . [START ON 01/20/2016] pantoprazole  40 mg Intravenous Q12H  . sodium chloride flush  3 mL Intravenous Q12H  . thiamine  100 mg Oral Daily   Or  . thiamine  100 mg Intravenous Daily   Continuous Infusions: . sodium chloride Stopped (01/16/16 0705)  . pantoprozole (PROTONIX) infusion 8 mg/hr (01/17/16 1045)     LOS: 1 day   Time Spent in minutes   30 minutes  Joseph Copeland D.O. on 01/18/2016 at 11:53 AM  Between 7am to 7pm - Pager - [phone]  After 7pm go to [email or web address] - password [email or web address]  And  look for the night coverage person covering for me after hours  [facility] Office  [phone]

## 2016-01-19 DIAGNOSIS — E440 Moderate protein-calorie malnutrition: Secondary | ICD-10-CM | POA: Insufficient documentation

## 2016-01-19 LAB — CBC
HCT: 26.5 % — ABNORMAL LOW (ref 39.0–52.0)
Hemoglobin: 8.6 g/dL — ABNORMAL LOW (ref 13.0–17.0)
MCH: 29.9 pg (ref 26.0–34.0)
MCHC: 32.5 g/dL (ref 30.0–36.0)
MCV: 92 fL (ref 78.0–100.0)
PLATELETS: 240 10*3/uL (ref 150–400)
RBC: 2.88 MIL/uL — AB (ref 4.22–5.81)
RDW: 18.5 % — AB (ref 11.5–15.5)
WBC: 5.3 10*3/uL (ref 4.0–10.5)

## 2016-01-19 LAB — GLUCOSE, CAPILLARY
GLUCOSE-CAPILLARY: 117 mg/dL — AB (ref 65–99)
GLUCOSE-CAPILLARY: 110 mg/dL — AB (ref 65–99)
GLUCOSE-CAPILLARY: 115 mg/dL — AB (ref 65–99)
Glucose-Capillary: 112 mg/dL — ABNORMAL HIGH (ref 65–99)

## 2016-01-19 LAB — BASIC METABOLIC PANEL
Anion gap: 9 (ref 5–15)
CO2: 24 mmol/L (ref 22–32)
CREATININE: 0.69 mg/dL (ref 0.61–1.24)
Calcium: 8.2 mg/dL — ABNORMAL LOW (ref 8.9–10.3)
Chloride: 107 mmol/L (ref 101–111)
GFR calc Af Amer: >60 mL/min (ref >60)
GLUCOSE: 110 mg/dL — AB (ref 65–99)
Potassium: 2.9 mmol/L — ABNORMAL LOW (ref 3.5–5.1)
SODIUM: 140 mmol/L (ref 135–145)

## 2016-01-19 MED ORDER — POTASSIUM CHLORIDE CRYS ER 20 MEQ PO TBCR
40.0000 meq | EXTENDED_RELEASE_TABLET | Freq: Every day | ORAL | Status: DC
  Administered 2016-01-19: 40 meq via ORAL
  Filled 2016-01-19: qty 2

## 2016-01-19 NOTE — Progress Notes (Signed)
Patient ID: Joseph Copeland, male   DOB: [birth year], 75 y.o.   MRN: [id number] [facility] Gastroenterology Progress Note  Meryl Agner 75 y.o. 05-Jun-1941   Subjective: Feels a lot better. Tolerating solid food. Denies dysphagia, nausea, or vomiting. Wife at bedside.  Objective: Vital signs: Vitals:   01/18/16 2007 01/19/16 0457  BP: 101/61 130/77  Pulse: 83 73  Resp: 18 18  Temp: 98.6 F (37 C) 98.7 F (37.1 C)    Physical Exam: Gen: alert, no acute distress  HEENT: anicteric sclera CV: RRR Chest: CTA B Abd: soft, nontender, nondistended, +BS  Lab Results:  Recent Labs  01/17/16 0251  01/18/16 0341 01/19/16 0443  NA 135  < > 137 140  K 2.6*  < > 3.4* 2.9*  CL 107  < > 107 107  CO2 23  --  25 24  GLUCOSE 112*  < > 123* 110*  BUN 10  < > <5* <5*  CREATININE 0.68  < > 0.74 0.69  CALCIUM 8.7*  --  8.4* 8.2*  MG 1.3*  --  1.6*  --   < > = values in this interval not displayed. No results for input(s): AST, ALT, ALKPHOS, BILITOT, PROT, ALBUMIN in the last 72 hours.  Recent Labs  01/18/16 0341 01/19/16 0443  WBC 5.6 5.3  HGB 8.7* 8.6*  HCT 27.5* 26.5*  MCV 92.6 92.0  PLT 192 240      Assessment/Plan: Distal erosive esophagitis and esophageal ulcer - clinically improving. Ok to d/c tomorrow if continues to tolerate solid food on Carafate. Will sign off. Call us back if needed.   [place] C. 01/19/2016, 1:37 PM  Pager [phone]  If no answer or after 5 PM call [phone]

## 2016-01-19 NOTE — Progress Notes (Addendum)
PROGRESS NOTE    Joseph Copeland  [id number] DOB: 06-Sep-1940 DOA: 01/16/2016 PCP: Henrine Screws, MD   Brief Narrative:  HPI on 01/16/2016 by Ms. Tye Savoy Joseph Copeland is a 75 y.o. male with a medical history significant for, but not  limited to, seizure disorder, chronic low back pain , DM2, PEA arrest . Patient brought to ED for evaluation of weakness, weight loss and seveal month history of solid food dysphagia. Patient states he is down 20 pounds snce December. He as eating normal in December then began to notice that food was going down slowly. Nothing ever got stuck in his esophagus, just moved slowly through. Liquids are okay. About 10 days ago patient nearly stopped eating. He has reflux, worse in reclined position. He has associated heartburn at times and takes Mylanta or Tums. For several days he has vomited in the am and sometimes during the night. Emesis mainly previously consumed mild.  No urinary discomfort. BMs less frequent given poor intake.   Unsteady on feet last several days. Lightheaded when ambulating. Patient describes EGD approx 18 months ago but not sure why it was done. He wasn't having any swallowing problems during time of EGD.  Assessment & Plan   Dysphagia, vomiting, weight loss -Outpatient weight 123 pounds on 11/28/15, today's weight is 122.  -CT abd/pelvis: Small hiatal hernia with reflux esophagitis -Gastroenterology consulted and appreciated -EGD 01/17/2016: L a grade D esophagitis, Z line irregular (biopsied) congested nodular ulcerated mucosa in the esophagus (biopsy), congested erythematous eroded and ulcerated mucosa in the antrum opened C biopsied).  -Pathology Shows mild chronic gastritis, negative for heel back to her pylori, no intestinal metaplasia and dysplasia or malignancy. -Recommendations were to continue Protonix, clear liquid diet, Carafate slurry, await pathology results -Patient tolerating clear liquid diet, diet advanced as  tolerated today  Orthostatic hypotension  -Vitals did not appear to coincide with true orthostasis  -Blood pressure appears to have improved    Sinus tachycardia -Resolved, possibly due to dehydration -Continue metoprolol  Diabetes mellitus, type II -Hemoglobin A1c 5.7 -Continue insulin sliding scale CBG monitoring  Normocytic anemia -Hemoglobin baseline appears to be around 12, currently 8.6 -Monitor CBC closely (suspect drop in hemoglobin due to dilutional component) -Anemia panel: Iron 38, ferritin 138, folate 53, vitamin B 1505 -Pending FOBT  Multiple, enlarging hypodense liver lesions  -Seen on CTscan. May need MRI in future to better characterize  Seizure disorder. -Stable, Denies recent seizures.  -continue home seizure medications  ? COPD -hyperexpanded lungs on CTscan. Remote smoking history.  Hx of ETOH abuse -INR 1.24 -continue CIWA  Mild hyperbilirubinemia -predominantly direct speaking against obstructive process.    Hypomagnesemia/hypokalemia -Will replace and continue to monitor  Eye drainage -Daughter is concerned that her father has eye drainage -Currently, no drainage noted on exam.  Advised patient to follow up with his eye doctor whom he sees at the [place].  Moderate malnutrition -Nutrition consulted, continue feeding/meal supplements   DVT Prophylaxis  Lovenox  Code Status: Full  Family Communication:  None at besdide  Disposition Plan: Admitted, will advance diet as tolerated. Possible discharge within the next 24 hours.  Consultants Gastroenterology  Procedures  EGD  Antibiotics   Anti-infectives    None      Subjective:   Joseph Copeland seen and examined today.  Denies nausea, vomiting, constipation, shortness of breath, chest pain, dizziness, headache. Patient states he is feeling better.  Objective:   Vitals:   01/17/16 2100 01/18/16 0504 01/18/16 2007 01/19/16  0457  BP: 116/68 119/70 101/61 130/77  Pulse: 88 80  83 73  Resp: 18 17 18 18   Temp: 98 F (36.7 C) 99.2 F (37.3 C) 98.6 F (37 C) 98.7 F (37.1 C)  TempSrc: Oral Oral Oral Oral  SpO2: 100% 99% 100% 100%  Weight:  55.8 kg (123 lb)  56.5 kg (124 lb 8 oz)  Height:        Intake/Output Summary (Last 24 hours) at 01/19/16 1323 Last data filed at 01/19/16 0931  Gross per 24 hour  Intake          1956.25 ml  Output             1952 ml  Net             4.25 ml   Filed Weights   01/17/16 0421 01/18/16 0504 01/19/16 0457  Weight: 55.4 kg (122 lb 3.2 oz) 55.8 kg (123 lb) 56.5 kg (124 lb 8 oz)    Exam  General: Well developed,cachetic, NAD  HEENT: NCAT, PERRLA, mucous membranes moist.   Cardiovascular: S1 S2 auscultated, no murmurs, RRR  Respiratory: Clear to auscultation bilaterally with equal chest rise  Abdomen: Soft, nontender, nondistended, + bowel sounds  Extremities: warm dry without cyanosis clubbing or edema  Neuro: AAOx3, nonfocal  Psych: Normal affect and demeanor with intact judgement and insight, pleasant   Data Reviewed: I have personally reviewed following labs and imaging studies  CBC:  Recent Labs Lab 01/15/16 0256 01/15/16 2252 01/17/16 0251 01/17/16 0816 01/18/16 0341 01/19/16 0443  WBC  --  12.3* 8.9  --  5.6 5.3  NEUTROABS 10.7*  --   --   --   --   --   HGB  --  12.5* 9.0* 10.5* 8.7* 8.6*  HCT  --  38.3* 28.5* 31.0* 27.5* 26.5*  MCV  --  90.3 91.3  --  92.6 92.0  PLT  --  156 145*  --  192 [id number]   Basic Metabolic Panel:  Recent Labs Lab 01/15/16 2252 01/17/16 0251 01/17/16 0816 01/18/16 0341 01/19/16 0443  NA 136 135 139 137 140  K 3.2* 2.6* 3.1* 3.4* 2.9*  CL 100* 107 104 107 107  CO2 21* 23  --  25 24  GLUCOSE 207* 112* 109* 123* 110*  BUN 24* 10 10 <5* <5*  CREATININE 1.18 0.68 0.60* 0.74 0.69  CALCIUM 10.7* 8.7*  --  8.4* 8.2*  MG  --  1.3*  --  1.6*  --    GFR: Estimated Creatinine Clearance: 64.7 mL/min (by C-G formula based on SCr of 0.8 mg/dL). Liver Function  Tests:  Recent Labs Lab 01/15/16 0256  AST 54*  ALT 18  ALKPHOS 84  BILITOT 1.8*  PROT 8.9*  ALBUMIN 4.5   No results for input(s): LIPASE, AMYLASE in the last 168 hours. No results for input(s): AMMONIA in the last 168 hours. Coagulation Profile:  Recent Labs Lab 01/16/16 1130  INR 1.24   Cardiac Enzymes: No results for input(s): CKTOTAL, CKMB, CKMBINDEX, TROPONINI in the last 168 hours. BNP (last 3 results) No results for input(s): PROBNP in the last 8760 hours. HbA1C: No results for input(s): HGBA1C in the last 72 hours. CBG:  Recent Labs Lab 01/18/16 1127 01/18/16 1704 01/18/16 2106 01/19/16 0556 01/19/16 1204  GLUCAP 179* 110* 112* 117* 110*   Lipid Profile: No results for input(s): CHOL, HDL, LDLCALC, TRIG, CHOLHDL, LDLDIRECT in the last 72 hours. Thyroid Function Tests: No results for  input(s): TSH, T4TOTAL, FREET4, T3FREE, THYROIDAB in the last 72 hours. Anemia Panel:  Recent Labs  01/18/16 1215 01/18/16 1216  VITAMINB12  --  1,505*  FOLATE 53.4  --   FERRITIN  --  138  TIBC  --  262  IRON  --  38*  RETICCTPCT  --  3.0   Urine analysis:    Component Value Date/Time   COLORURINE AMBER (A) 01/16/2016 0513   APPEARANCEUR CLOUDY (A) 01/16/2016 0513   LABSPEC 1.026 01/16/2016 0513   PHURINE 6.0 01/16/2016 0513   GLUCOSEU NEGATIVE 01/16/2016 0513   HGBUR NEGATIVE 01/16/2016 0513   BILIRUBINUR MODERATE (A) 01/16/2016 0513   KETONESUR 40 (A) 01/16/2016 0513   PROTEINUR 30 (A) 01/16/2016 0513   UROBILINOGEN 0.2 07/27/2014 1916   NITRITE NEGATIVE 01/16/2016 0513   LEUKOCYTESUR TRACE (A) 01/16/2016 0513   Sepsis Labs: @LABRCNTIP (procalcitonin:4,lacticidven:4)  )No results found for this or any previous visit (from the past 240 hour(s)).    Radiology Studies: No results found.   Scheduled Meds: . enoxaparin (LOVENOX) injection  40 mg Subcutaneous Daily  . feeding supplement  1 Container Oral TID BM  . feeding supplement (PRO-STAT SUGAR  FREE 64)  30 mL Oral BID  . folic acid  1 mg Oral Daily  . insulin aspart  0-9 Units Subcutaneous TID WC  . levETIRAcetam  500 mg Oral BID  . metoprolol tartrate  25 mg Oral Daily  . multivitamin with minerals  1 tablet Oral Daily  . [START ON 01/20/2016] pantoprazole  40 mg Intravenous Q12H  . potassium chloride  40 mEq Oral Daily  . sodium chloride flush  3 mL Intravenous Q12H  . sucralfate  1 g Oral TID WC & HS  . thiamine  100 mg Oral Daily   Or  . thiamine  100 mg Intravenous Daily   Continuous Infusions: . sodium chloride 1,000 mL (01/19/16 0612)  . pantoprozole (PROTONIX) infusion 8 mg/hr (01/18/16 2026)     LOS: 2 days   Time Spent in minutes   30 minutes  Joseph Copeland D.O. on 01/19/2016 at 1:23 PM  Between 7am to 7pm - Pager - [phone]  After 7pm go to [email or web address] - password [email or web address]  And look for the night coverage person covering for me after hours  [facility] Office  [phone]

## 2016-01-20 DIAGNOSIS — E440 Moderate protein-calorie malnutrition: Secondary | ICD-10-CM

## 2016-01-20 LAB — GLUCOSE, CAPILLARY
GLUCOSE-CAPILLARY: 139 mg/dL — AB (ref 65–99)
GLUCOSE-CAPILLARY: 115 mg/dL — AB (ref 65–99)
Glucose-Capillary: 113 mg/dL — ABNORMAL HIGH (ref 65–99)
Glucose-Capillary: 112 mg/dL — ABNORMAL HIGH (ref 65–99)

## 2016-01-20 LAB — BASIC METABOLIC PANEL
Anion gap: 7 (ref 5–15)
CO2: 26 mmol/L (ref 22–32)
Calcium: 8.2 mg/dL — ABNORMAL LOW (ref 8.9–10.3)
Chloride: 106 mmol/L (ref 101–111)
Creatinine, Ser: 0.67 mg/dL (ref 0.61–1.24)
GFR calc Af Amer: >60 mL/min (ref >60)
GLUCOSE: 107 mg/dL — AB (ref 65–99)
POTASSIUM: 2.8 mmol/L — AB (ref 3.5–5.1)
Sodium: 139 mmol/L (ref 135–145)

## 2016-01-20 LAB — CBC
HEMATOCRIT: 29.1 % — AB (ref 39.0–52.0)
Hemoglobin: 8.9 g/dL — ABNORMAL LOW (ref 13.0–17.0)
MCH: 29.1 pg (ref 26.0–34.0)
MCHC: 30.6 g/dL (ref 30.0–36.0)
MCV: 95.1 fL (ref 78.0–100.0)
PLATELETS: 320 10*3/uL (ref 150–400)
RBC: 3.06 MIL/uL — AB (ref 4.22–5.81)
RDW: 18.8 % — ABNORMAL HIGH (ref 11.5–15.5)
WBC: 6.7 10*3/uL (ref 4.0–10.5)

## 2016-01-20 LAB — MAGNESIUM: Magnesium: 1.2 mg/dL — ABNORMAL LOW (ref 1.7–2.4)

## 2016-01-20 LAB — OCCULT BLOOD X 1 CARD TO LAB, STOOL: FECAL OCCULT BLD: POSITIVE — AB

## 2016-01-20 MED ORDER — MAGNESIUM SULFATE 2 GM/50ML IV SOLN
2.0000 g | Freq: Once | INTRAVENOUS | Status: AC
  Administered 2016-01-20: 2 g via INTRAVENOUS
  Filled 2016-01-20: qty 50

## 2016-01-20 MED ORDER — POTASSIUM CHLORIDE CRYS ER 20 MEQ PO TBCR
30.0000 meq | EXTENDED_RELEASE_TABLET | ORAL | Status: DC
  Administered 2016-01-20: 30 meq via ORAL
  Filled 2016-01-20 (×2): qty 1

## 2016-01-20 MED ORDER — POTASSIUM CHLORIDE CRYS ER 20 MEQ PO TBCR
40.0000 meq | EXTENDED_RELEASE_TABLET | ORAL | Status: AC
  Administered 2016-01-20 (×2): 40 meq via ORAL
  Filled 2016-01-20 (×2): qty 2

## 2016-01-20 MED ORDER — POTASSIUM CHLORIDE CRYS ER 20 MEQ PO TBCR: 40.0000 meq | EXTENDED_RELEASE_TABLET | Freq: Three times a day (TID) | ORAL | Status: DC

## 2016-01-20 NOTE — Progress Notes (Signed)
PROGRESS NOTE    Joseph Copeland  [id number] DOB: [birth year] DOA: 01/16/2016 PCP: Henrine Screws, MD   Brief Narrative:  HPI on 01/16/2016 by Ms. Tye Savoy Joseph Copeland is a 75 y.o. male with a medical history significant for, but not  limited to, seizure disorder, chronic low back pain , DM2, PEA arrest . Patient brought to ED for evaluation of weakness, weight loss and seveal month history of solid food dysphagia. Patient states he is down 20 pounds snce December. He as eating normal in December then began to notice that food was going down slowly. Nothing ever got stuck in his esophagus, just moved slowly through. Liquids are okay. About 10 days ago patient nearly stopped eating. He has reflux, worse in reclined position. He has associated heartburn at times and takes Mylanta or Tums. For several days he has vomited in the am and sometimes during the night. Emesis mainly previously consumed mild.  No urinary discomfort. BMs less frequent given poor intake.   Unsteady on feet last several days. Lightheaded when ambulating. Patient describes EGD approx 18 months ago but not sure why it was done. He wasn't having any swallowing problems during time of EGD.  Assessment & Plan   Dysphagia, vomiting, weight loss -Outpatient weight 123 pounds on 11/28/15, today's weight is 122.  -CT abd/pelvis: Small hiatal hernia with reflux esophagitis -Gastroenterology consulted and appreciated -EGD 01/17/2016: L a grade D esophagitis, Z line irregular (biopsied) congested nodular ulcerated mucosa in the esophagus (biopsy), congested erythematous eroded and ulcerated mucosa in the antrum opened C biopsied).  -Pathology Shows mild chronic gastritis, negative for heel back to her pylori, no intestinal metaplasia and dysplasia or malignancy. -Recommendations were to continue Protonix, clear liquid diet, Carafate slurry, await pathology results -Diet advanced to heart healthy/carb modified, patient tolerated  well  Orthostatic hypotension  -Vitals did not appear to coincide with true orthostasis  -Blood pressure appears to have improved    Sinus tachycardia -Resolved, possibly due to dehydration -Continue metoprolol  Diabetes mellitus, type II -Hemoglobin A1c 5.7 -Continue insulin sliding scale CBG monitoring  Normocytic anemia -Hemoglobin baseline appears to be around 12, currently 8.6 -Monitor CBC closely (suspect drop in hemoglobin due to dilutional component) -Anemia panel: Iron 38, ferritin 138, folate 53, vitamin B 1505 -Pending FOBT  Multiple, enlarging hypodense liver lesions  -Seen on CTscan. May need MRI in future to better characterize  Seizure disorder. -Stable, Denies recent seizures.  -continue home seizure medications  ? COPD -hyperexpanded lungs on CTscan. Remote smoking history.  Hx of ETOH abuse -INR 1.24 -continue CIWA  Mild hyperbilirubinemia -predominantly direct speaking against obstructive process.    Hypomagnesemia/hypokalemia -Magnesium 1.2, potassium 2.8 (despite replacement) -likely complicated by poor oral intake -Continue IV and PO supplementation  -Monitor BMP and magnesium  Eye drainage -Daughter is concerned that her father has eye drainage -Currently, no drainage noted on exam.  Advised patient to follow up with his eye doctor whom he sees at the [place].  Moderate malnutrition -Nutrition consulted, continue feeding/meal supplements   DVT Prophylaxis  Lovenox  Code Status: Full  Family Communication:  None at [place]  Disposition Plan: Admitted, will advance diet as tolerated. Possible discharge within the next 24 hours.  Consultants Gastroenterology  Procedures  EGD  Antibiotics   Anti-infectives    None      Subjective:   Joseph Copeland seen and examined today.  Feeling better today.  Able to eat without pain. Denies nausea, vomiting, constipation, shortness of breath,  chest pain, dizziness, headache.    Objective:   Vitals:   01/19/16 1431 01/19/16 1957 01/20/16 0550 01/20/16 0900  BP: 118/70 118/77 108/68 123/66  Pulse: 81 74 85 81  Resp: 18 20 20 20   Temp: 98.4 F (36.9 C) 98.6 F (37 C) 98.6 F (37 C) 97.8 F (36.6 C)  TempSrc: Oral  Oral Oral  SpO2: 98% 100% 100% 100%  Weight:   55.6 kg (122 lb 7.5 oz)   Height:        Intake/Output Summary (Last 24 hours) at 01/20/16 1117 Last data filed at 01/20/16 0900  Gross per 24 hour  Intake             1470 ml  Output             2500 ml  Net            -1030 ml   Filed Weights   01/18/16 0504 01/19/16 0457 01/20/16 0550  Weight: 55.8 kg (123 lb) 56.5 kg (124 lb 8 oz) 55.6 kg (122 lb 7.5 oz)    Exam  General: Well developed,cachetic, NAD  HEENT: NCAT, PERRLA, mucous membranes moist.   Cardiovascular: S1 S2 auscultated, no murmurs, RRR  Respiratory: Clear to auscultation bilaterally with equal chest rise  Abdomen: Soft, nontender, nondistended, + bowel sounds  Extremities: warm dry without cyanosis clubbing or edema  Neuro: AAOx3, nonfocal  Psych: Normal affect and demeanor with intact judgement and insight, pleasant   Data Reviewed: I have personally reviewed following labs and imaging studies  CBC:  Recent Labs Lab 01/15/16 0256  01/15/16 2252 01/17/16 0251 01/17/16 0816 01/18/16 0341 01/19/16 0443 01/20/16 0240  WBC  --   --  12.3* 8.9  --  5.6 5.3 6.7  NEUTROABS 10.7*  --   --   --   --   --   --   --   HGB  --   < > 12.5* 9.0* 10.5* 8.7* 8.6* 8.9*  HCT  --   < > 38.3* 28.5* 31.0* 27.5* 26.5* 29.1*  MCV  --   --  90.3 91.3  --  92.6 92.0 95.1  PLT  --   --  156 145*  --  192 240 320  < > = values in this interval not displayed. Basic Metabolic Panel:  Recent Labs Lab 01/15/16 2252 01/17/16 0251 01/17/16 0816 01/18/16 0341 01/19/16 0443 01/20/16 0240  NA 136 135 139 137 140 139  K 3.2* 2.6* 3.1* 3.4* 2.9* 2.8*  CL 100* 107 104 107 107 106  CO2 21* 23  --  25 24 26   GLUCOSE 207* 112*  109* 123* 110* 107*  BUN 24* 10 10 <5* <5* <5*  CREATININE 1.18 0.68 0.60* 0.74 0.69 0.67  CALCIUM 10.7* 8.7*  --  8.4* 8.2* 8.2*  MG  --  1.3*  --  1.6*  --  1.2*   GFR: Estimated Creatinine Clearance: 63.7 mL/min (by C-G formula based on SCr of 0.8 mg/dL). Liver Function Tests:  Recent Labs Lab 01/15/16 0256  AST 54*  ALT 18  ALKPHOS 84  BILITOT 1.8*  PROT 8.9*  ALBUMIN 4.5   No results for input(s): LIPASE, AMYLASE in the last 168 hours. No results for input(s): AMMONIA in the last 168 hours. Coagulation Profile:  Recent Labs Lab 01/16/16 1130  INR 1.24   Cardiac Enzymes: No results for input(s): CKTOTAL, CKMB, CKMBINDEX, TROPONINI in the last 168 hours. BNP (last 3 results) No results  for input(s): PROBNP in the last 8760 hours. HbA1C: No results for input(s): HGBA1C in the last 72 hours. CBG:  Recent Labs Lab 01/19/16 0556 01/19/16 1204 01/19/16 1656 01/19/16 2152 01/20/16 0642  GLUCAP 117* 110* 112* 115* 113*   Lipid Profile: No results for input(s): CHOL, HDL, LDLCALC, TRIG, CHOLHDL, LDLDIRECT in the last 72 hours. Thyroid Function Tests: No results for input(s): TSH, T4TOTAL, FREET4, T3FREE, THYROIDAB in the last 72 hours. Anemia Panel:  Recent Labs  01/18/16 1215 01/18/16 1216  VITAMINB12  --  1,505*  FOLATE 53.4  --   FERRITIN  --  138  TIBC  --  262  IRON  --  38*  RETICCTPCT  --  3.0   Urine analysis:    Component Value Date/Time   COLORURINE AMBER (A) 01/16/2016 0513   APPEARANCEUR CLOUDY (A) 01/16/2016 0513   LABSPEC 1.026 01/16/2016 0513   PHURINE 6.0 01/16/2016 0513   GLUCOSEU NEGATIVE 01/16/2016 0513   HGBUR NEGATIVE 01/16/2016 0513   BILIRUBINUR MODERATE (A) 01/16/2016 0513   KETONESUR 40 (A) 01/16/2016 0513   PROTEINUR 30 (A) 01/16/2016 0513   UROBILINOGEN 0.2 07/27/2014 1916   NITRITE NEGATIVE 01/16/2016 0513   LEUKOCYTESUR TRACE (A) 01/16/2016 0513   Sepsis Labs: @LABRCNTIP (procalcitonin:4,lacticidven:4)  )No  results found for this or any previous visit (from the past 240 hour(s)).    Radiology Studies: No results found.   Scheduled Meds: . enoxaparin (LOVENOX) injection  40 mg Subcutaneous Daily  . feeding supplement  1 Container Oral TID BM  . feeding supplement (PRO-STAT SUGAR FREE 64)  30 mL Oral BID  . folic acid  1 mg Oral Daily  . insulin aspart  0-9 Units Subcutaneous TID WC  . levETIRAcetam  500 mg Oral BID  . metoprolol tartrate  25 mg Oral Daily  . multivitamin with minerals  1 tablet Oral Daily  . pantoprazole  40 mg Intravenous Q12H  . potassium chloride  40 mEq Oral Q4H  . sodium chloride flush  3 mL Intravenous Q12H  . sucralfate  1 g Oral TID WC & HS  . thiamine  100 mg Oral Daily   Or  . thiamine  100 mg Intravenous Daily   Continuous Infusions: . sodium chloride 1,000 mL (01/20/16 0755)     LOS: 3 days   Time Spent in minutes   30 minutes  Tramya Schoenfelder D.O. on 01/20/2016 at 11:17 AM  Between 7am to 7pm - Pager - [phone]  After 7pm go to [email or web address] - password [email or web address]  And look for the night coverage person covering for me after hours  [facility] Office  [phone]

## 2016-01-21 LAB — GLUCOSE, CAPILLARY
GLUCOSE-CAPILLARY: 111 mg/dL — AB (ref 65–99)
Glucose-Capillary: 144 mg/dL — ABNORMAL HIGH (ref 65–99)

## 2016-01-21 LAB — BASIC METABOLIC PANEL
ANION GAP: 5 (ref 5–15)
BUN: 5 mg/dL — AB (ref 6–20)
CHLORIDE: 108 mmol/L (ref 101–111)
CO2: 26 mmol/L (ref 22–32)
Calcium: 8.4 mg/dL — ABNORMAL LOW (ref 8.9–10.3)
Creatinine, Ser: 0.75 mg/dL (ref 0.61–1.24)
Glucose, Bld: 112 mg/dL — ABNORMAL HIGH (ref 65–99)
POTASSIUM: 3.8 mmol/L (ref 3.5–5.1)
SODIUM: 139 mmol/L (ref 135–145)

## 2016-01-21 LAB — MAGNESIUM: MAGNESIUM: 1.6 mg/dL — AB (ref 1.7–2.4)

## 2016-01-21 MED ORDER — MAGNESIUM SULFATE 2 GM/50ML IV SOLN
2.0000 g | Freq: Once | INTRAVENOUS | Status: AC
  Administered 2016-01-21: 2 g via INTRAVENOUS
  Filled 2016-01-21: qty 50

## 2016-01-21 MED ORDER — PANTOPRAZOLE SODIUM 40 MG PO TBEC: 40.0000 mg | DELAYED_RELEASE_TABLET | Freq: Two times a day (BID) | ORAL | 0 refills | Status: DC

## 2016-01-21 MED ORDER — MAGNESIUM OXIDE 400 (241.3 MG) MG PO TABS: 400.0000 mg | ORAL_TABLET | Freq: Every day | ORAL | 0 refills | Status: DC

## 2016-01-21 MED ORDER — PRO-STAT SUGAR FREE PO LIQD: 30.0000 mL | Freq: Two times a day (BID) | ORAL | 0 refills | Status: DC

## 2016-01-21 MED ORDER — FOLIC ACID 1 MG PO TABS: 1.0000 mg | ORAL_TABLET | Freq: Every day | ORAL | 0 refills | Status: DC

## 2016-01-21 MED ORDER — THIAMINE HCL 100 MG PO TABS: 100.0000 mg | ORAL_TABLET | Freq: Every day | ORAL | 0 refills | Status: DC

## 2016-01-21 MED ORDER — PANTOPRAZOLE SODIUM 40 MG PO TBEC
40.0000 mg | DELAYED_RELEASE_TABLET | Freq: Two times a day (BID) | ORAL | Status: DC
  Administered 2016-01-21: 40 mg via ORAL
  Filled 2016-01-21: qty 1

## 2016-01-21 MED ORDER — FERROUS SULFATE 325 (65 FE) MG PO TABS: 325.0000 mg | ORAL_TABLET | Freq: Two times a day (BID) | ORAL | 0 refills | Status: DC

## 2016-01-21 MED ORDER — FERROUS SULFATE 325 (65 FE) MG PO TABS
325.0000 mg | ORAL_TABLET | Freq: Two times a day (BID) | ORAL | Status: DC
  Administered 2016-01-21: 325 mg via ORAL
  Filled 2016-01-21: qty 1

## 2016-01-21 MED ORDER — POTASSIUM CHLORIDE ER 10 MEQ PO TBCR: 10.0000 meq | EXTENDED_RELEASE_TABLET | Freq: Every day | ORAL | 0 refills | Status: DC

## 2016-01-21 MED ORDER — POLYETHYLENE GLYCOL 3350 17 G PO PACK: 17.0000 g | PACK | Freq: Every day | ORAL | 0 refills | Status: DC | PRN

## 2016-01-21 MED ORDER — ADULT MULTIVITAMIN W/MINERALS CH: 1.0000 | ORAL_TABLET | Freq: Every day | ORAL | 0 refills | Status: AC

## 2016-01-21 MED ORDER — SUCRALFATE 1 GM/10ML PO SUSP: 1.0000 g | Freq: Three times a day (TID) | ORAL | 0 refills | Status: DC

## 2016-01-21 NOTE — Progress Notes (Signed)
Patient alert and oriented, denies pain no shortness of breath, v/s stable. Iv and tele d/c, all questions answer, d/c instruction explain to patient and family, both verbalized understanding. D/c pt. Home per order.

## 2016-01-21 NOTE — Discharge Summary (Signed)
Physician Discharge Summary  Joseph Copeland [id number] DOB: 09-03-1940 DOA: 01/16/2016  PCP: Henrine Screws, MD  Admit date: 01/16/2016 Discharge date: 01/21/2016  Time spent: 45 minutes  Recommendations for Outpatient Follow-up:  Patient will be discharged to home.  Patient will need to follow up with primary care provider within one week of discharge, repeat CBC and BMP, magnesium (labs) in one week.  Follow up with gastroenterology as needed.  Patient should continue medications as prescribed.  Patient should follow a heart healthy/carb modified diet.   Discharge Diagnoses:  Active Problems:   HTN (hypertension)   Diabetes mellitus with complication (HCC)   Anemia   Orthostatic hypotension   Dysphagia   Tachycardia   Weight loss   Seizure disorder (HCC)   Elevated transaminase level   Hypokalemia   Malnutrition of moderate degree   Discharge Condition: Stable  Diet recommendation: heart healthy/carb modified  Filed Weights   01/19/16 0457 01/20/16 0550 01/21/16 0514  Weight: 56.5 kg (124 lb 8 oz) 55.6 kg (122 lb 7.5 oz) 55.9 kg (123 lb 3.2 oz)    History of present illness:  on 01/16/2016 by Ms. Stephanie Touhey Murphyis a 76 y.o.malewith a medical history significant for, but not limited to, seizure disorder, chronic low back pain , DM2, PEA arrest . Patient brought to ED for evaluation of weakness, weight loss and seveal month history of solid food dysphagia. Patient states he is down 20 pounds snce December. He as eating normal in December then began to notice that food was going down slowly. Nothing ever got stuck in his esophagus, just moved slowly through. Liquids are okay. About 10 days ago patient nearly stopped eating. He has reflux, worse in reclined position. He has associated heartburn at times and takes Mylanta or Tums. For several days he has vomited in the am and sometimes during the night. Emesis mainly previously consumed mild. No urinary  discomfort. BMs less frequent given poor intake.   Unsteady on feet last several days. Lightheaded when ambulating. Patient describes EGD approx 18 months ago but not sure why it was done. He wasn't having any swallowing problems during time of EGD.   Hospital Course:  Dysphagia, vomiting, weight loss -Outpatient weight 123 pounds on 11/28/15, today's weight is 122.  -CT abd/pelvis: Small hiatal hernia with reflux esophagitis -Gastroenterology consulted and appreciated -EGD 01/17/2016: L a grade D esophagitis, Z line irregular (biopsied) congested nodular ulcerated mucosa in the esophagus (biopsy), congested erythematous eroded and ulcerated mucosa in the antrum opened C biopsied).  -Pathology Shows mild chronic gastritis, negative for heel back to her pylori, no intestinal metaplasia and dysplasia or malignancy. -Recommendations were to continue Protonix, clear liquid diet, Carafate slurry, await pathology results -Diet advanced to heart healthy/carb modified, patient tolerated well  Orthostatic hypotension -Vitals did not appear to coincide with true orthostasis  -Blood pressure appears to have improved   Sinus tachycardia -Resolved, possibly due to dehydration -Continue metoprolol  Diabetes mellitus, type II -Hemoglobin A1c 5.7 -Continue insulin sliding scale CBG monitoring  Normocytic anemia -Hemoglobin baseline appears to be around 12, currently 8.9 -Monitor CBC closely (suspect drop in hemoglobin due to dilutional component) -Anemia panel: Iron 38, ferritin 138, folate 53, vitamin B 1505 -FOBT + (however patient did EGD with biopsy) -Repeat CBC in one week -Started on iron supplementation  Multiple, enlarging hypodense liver lesions -Seen on CTscan. May need MRI in future to better characterize  Seizure disorder. -Stable, Denies recent seizures.  -continue home seizure medications  ?  COPD -hyperexpanded lungs on CTscan. Remote smoking history.  Hx of ETOH  abuse -INR 1.24 -continue CIWA  Mild hyperbilirubinemia -predominantly direct speaking against obstructive process.   Hypomagnesemia/hypokalemia -Improving, on discharge K3.8, Mag 1.6- given replacement -likely complicated by poor oral intake -Continue PO supplementation  -Repeat BMP and magnesium in one week  Eye drainage -Daughter is concerned that her father has eye drainage -Currently, no drainage noted on exam.  Advised patient to follow up with his eye doctor whom he sees at the [place].  Moderate malnutrition -Nutrition consulted, continue feeding/meal supplements  Consultants Gastroenterology  Procedures  EGD  Discharge Exam: Vitals:   01/21/16 0514 01/21/16 1000  BP: 131/76 124/82  Pulse: 77 98  Resp: 18   Temp: 98.9 F (37.2 C) 99.3 F (37.4 C)    Exam  General: Well developed,cachetic, NAD  HEENT: NCAT, PERRLA, mucous membranes moist.   Cardiovascular: S1 S2 auscultated, no murmurs, RRR  Respiratory: Clear to auscultation bilaterally with equal chest rise  Abdomen: Soft, nontender, nondistended, + bowel sounds  Extremities: warm dry without cyanosis clubbing or edema  Neuro: AAOx3, nonfocal  Psych: Normal affect and demeanor with intact judgement and insight, pleasant  Discharge Instructions  Discharge Instructions    Discharge instructions    Complete by:  As directed   Patient will be discharged to home.  Patient will need to follow up with primary care provider within one week of discharge, repeat CBC and BMP, magnesium (labs) in one week.  Follow up with gastroenterology as needed.  Patient should continue medications as prescribed.  Patient should follow a heart healthy/carb modified diet.       Medication List    TAKE these medications   feeding supplement (PRO-STAT SUGAR FREE 64) Liqd Take 30 mLs by mouth 2 (two) times daily.   ferrous sulfate 325 (65 FE) MG tablet Take 1 tablet (325 mg total) by mouth 2 (two) times daily with a  meal.   folic acid 1 MG tablet Commonly known as:  FOLVITE Take 1 tablet (1 mg total) by mouth daily.   lactose free nutrition Liqd Take 237 mLs by mouth daily.   levETIRAcetam 500 MG tablet Commonly known as:  KEPPRA Take 1 tablet (500 mg total) by mouth 2 (two) times daily.   magnesium oxide 400 (241.3 Mg) MG tablet Commonly known as:  MAG-OX Take 1 tablet (400 mg total) by mouth daily.   metoprolol tartrate 25 MG tablet Commonly known as:  LOPRESSOR Take 25 mg by mouth daily.   multivitamin with minerals Tabs tablet Take 1 tablet by mouth daily.   pantoprazole 40 MG tablet Commonly known as:  PROTONIX Take 1 tablet (40 mg total) by mouth 2 (two) times daily.   polyethylene glycol packet Commonly known as:  MIRALAX / GLYCOLAX Take 17 g by mouth daily as needed for mild constipation.   potassium chloride 10 MEQ tablet Commonly known as:  K-DUR Take 1 tablet (10 mEq total) by mouth daily.   sucralfate 1 GM/10ML suspension Commonly known as:  CARAFATE Take 10 mLs (1 g total) by mouth 4 (four) times daily -  with meals and at bedtime.   testosterone 50 MG/5GM (1%) Gel Commonly known as:  ANDROGEL Place 5 g onto the skin daily.   thiamine 100 MG tablet Take 1 tablet (100 mg total) by mouth daily.   VITAMIN B 12 PO Take 1 tablet by mouth daily.      No Known Allergies Follow-up Information  GATES,ROBERT NEVILL, MD. Schedule an appointment as soon as possible for a visit in 1 week(s).   Specialty:  Internal Medicine Why:  Hospital follow up, repeat labs Contact information: [address] [place] [address] [phone]        Lear Ng., MD. Schedule an appointment as soon as possible for a visit today.   Specialty:  Gastroenterology Why:  As needed Contact information: [address] [place] [address] [phone]            The results of significant diagnostics from this hospitalization  (including imaging, microbiology, ancillary and laboratory) are listed below for reference.    Significant Diagnostic Studies: Dg Chest 2 View  Result Date: 01/15/2016 CLINICAL DATA:  Mid upper chest pain for the past 7-10 days. Vomiting. Decreased appetite. History of diabetes and hypertension. EXAM: CHEST  2 VIEW COMPARISON:  03/05/2012; 02/13/2012 FINDINGS: Grossly unchanged cardiac silhouette and mediastinal contours with atherosclerotic plaque within the thoracic aorta. The lungs remain hyperexpanded. No focal airspace opacities. No pleural effusion or pneumothorax. No evidence of edema. No acute osseous abnormalities. Stigmata DISH within the thoracic spine. Enthesopathic change involving the right rotator cuff insertion site, incompletely evaluated. IMPRESSION: 1. Hyperexpanded lungs without acute cardiopulmonary disease. 2.  Aortic Atherosclerosis (ICD10-170.0) Electronically Signed   By: Sandi Mariscal M.D.   On: 01/15/2016 23:04  Ct Abdomen Pelvis W Contrast  Result Date: 01/16/2016 CLINICAL DATA:  75 year old male with weakness and loss of appetite. Heartburn, nausea vomiting. EXAM: CT ABDOMEN AND PELVIS WITH CONTRAST TECHNIQUE: Multidetector CT imaging of the abdomen and pelvis was performed using the standard protocol following bolus administration of intravenous contrast. CONTRAST:  169mL ISOVUE-300 IOPAMIDOL (ISOVUE-300) INJECTION 61% COMPARISON:  CT dated 03/07/2012 FINDINGS: The visualized lung bases are clear. No intra-abdominal free air or free fluid. Diffuse fatty infiltration of the liver. Multiple hepatic hypodense lesions measuring up to 3.1 x 2.7 cm in the left lobe of the liver have increased in size compared to the prior study. These lesions are incompletely characterized but likely represent cysts or hemangioma. Small amount of sludge or small stones noted layering within the gallbladder. No pericholecystic fluid. The pancreas, spleen, adrenal glands appear unremarkable. There is a  1.5 cm left renal interpolar parapelvic cyst. Small bilateral renal hypodensities likely represent cysts. There is no hydronephrosis on either side. The visualized ureters and urinary bladder appear unremarkable. The prostate gland is enlarged measuring 5 cm in diameter. The seminal vesicles are grossly unremarkable. There is a small hiatal hernia with gastroesophageal reflux. There is diffuse circumferential thickening of the distal esophagus with stranding of the adjacent fat compatible with esophagitis. Findings may be related to recurrent reflux. Esophageal mass is less likely but not excluded. Only the distal portion of the esophagus is included in this study. There is colonic diverticulosis without active inflammatory changes. Moderate stool noted throughout the colon. No evidence of bowel obstruction. Normal appendix. The abdominal aorta and IVC appear unremarkable. The origins of the celiac axis, SMA, IMA as well as the origins of the renal arteries are patent. 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 The SMV, splenic vein, and main portal vein are patent. No portal venous gas identified. There is no adenopathy. There is a small fat containing umbilical hernia. The abdominal wall soft tissues are otherwise unremarkable. There is multilevel degenerative changes of the spine with osteophyte formation. L3-L4  and L4-5 disc desiccation with vacuum phenomena. No acute fracture. IMPRESSION: Small hiatal hernia with findings of reflux esophagitis. Clinical correlation is recommended. No bowel obstruction. Normal appendix. Colonic diverticulosis. Electronically Signed   By: Anner Crete M.D.   On: 01/16/2016 06:12   Microbiology: No results found for this or any previous visit (from the past 240 hour(s)).   Labs: Basic Metabolic Panel:  Recent Labs Lab 01/17/16 0251 01/17/16 0816 01/18/16 0341 01/19/16 0443 01/20/16 0240  01/21/16 0347  NA 135 139 137 140 139 139  K 2.6* 3.1* 3.4* 2.9* 2.8* 3.8  CL 107 104 107 107 106 108  CO2 23  --  25 24 26 26   GLUCOSE 112* 109* 123* 110* 107* 112*  BUN 10 10 <5* <5* <5* 5*  CREATININE 0.68 0.60* 0.74 0.69 0.67 0.75  CALCIUM 8.7*  --  8.4* 8.2* 8.2* 8.4*  MG 1.3*  --  1.6*  --  1.2* 1.6*   Liver Function Tests:  Recent Labs Lab 01/15/16 0256  AST 54*  ALT 18  ALKPHOS 84  BILITOT 1.8*  PROT 8.9*  ALBUMIN 4.5   No results for input(s): LIPASE, AMYLASE in the last 168 hours. No results for input(s): AMMONIA in the last 168 hours. CBC:  Recent Labs Lab 01/15/16 0256  01/15/16 2252 01/17/16 0251 01/17/16 0816 01/18/16 0341 01/19/16 0443 01/20/16 0240  WBC  --   --  12.3* 8.9  --  5.6 5.3 6.7  NEUTROABS 10.7*  --   --   --   --   --   --   --   HGB  --   < > 12.5* 9.0* 10.5* 8.7* 8.6* 8.9*  HCT  --   < > 38.3* 28.5* 31.0* 27.5* 26.5* 29.1*  MCV  --   --  90.3 91.3  --  92.6 92.0 95.1  PLT  --   --  156 145*  --  192 240 320  < > = values in this interval not displayed. Cardiac Enzymes: No results for input(s): CKTOTAL, CKMB, CKMBINDEX, TROPONINI in the last 168 hours. BNP: BNP (last 3 results) No results for input(s): BNP in the last 8760 hours.  ProBNP (last 3 results) No results for input(s): PROBNP in the last 8760 hours.  CBG:  Recent Labs Lab 01/20/16 0642 01/20/16 1119 01/20/16 1619 01/20/16 2153 01/21/16 0630  GLUCAP 113* 139* 112* 115* 111*       Signed:  Cristal Ford  [facility] 01/21/2016, 11:07 AM

## 2016-04-16 ENCOUNTER — Inpatient Hospital Stay (HOSPITAL_COMMUNITY)
Admission: EM | Admit: 2016-04-16 | Discharge: 2016-04-22 | DRG: 683 | Disposition: A | Discharge status: Home / Self Care | Payer: Medicare Other | Attending: Internal Medicine | Admitting: Internal Medicine

## 2016-04-16 ENCOUNTER — Encounter (HOSPITAL_COMMUNITY): Payer: Self-pay | Admitting: *Deleted

## 2016-04-16 DIAGNOSIS — H409 Unspecified glaucoma: Secondary | ICD-10-CM | POA: Diagnosis present

## 2016-04-16 DIAGNOSIS — N170 Acute kidney failure with tubular necrosis: Secondary | ICD-10-CM | POA: Diagnosis not present

## 2016-04-16 DIAGNOSIS — K760 Fatty (change of) liver, not elsewhere classified: Secondary | ICD-10-CM | POA: Diagnosis present

## 2016-04-16 DIAGNOSIS — E872 Acidosis, unspecified: Secondary | ICD-10-CM | POA: Diagnosis present

## 2016-04-16 DIAGNOSIS — A084 Viral intestinal infection, unspecified: Secondary | ICD-10-CM | POA: Diagnosis present

## 2016-04-16 DIAGNOSIS — E860 Dehydration: Secondary | ICD-10-CM | POA: Diagnosis present

## 2016-04-16 DIAGNOSIS — R627 Adult failure to thrive: Secondary | ICD-10-CM | POA: Diagnosis present

## 2016-04-16 DIAGNOSIS — Z79899 Other long term (current) drug therapy: Secondary | ICD-10-CM

## 2016-04-16 DIAGNOSIS — F10239 Alcohol dependence with withdrawal, unspecified: Secondary | ICD-10-CM | POA: Diagnosis present

## 2016-04-16 DIAGNOSIS — F1020 Alcohol dependence, uncomplicated: Secondary | ICD-10-CM | POA: Diagnosis present

## 2016-04-16 DIAGNOSIS — R197 Diarrhea, unspecified: Secondary | ICD-10-CM

## 2016-04-16 DIAGNOSIS — Z87891 Personal history of nicotine dependence: Secondary | ICD-10-CM

## 2016-04-16 DIAGNOSIS — G4730 Sleep apnea, unspecified: Secondary | ICD-10-CM | POA: Diagnosis present

## 2016-04-16 DIAGNOSIS — N179 Acute kidney failure, unspecified: Secondary | ICD-10-CM

## 2016-04-16 DIAGNOSIS — K209 Esophagitis, unspecified: Secondary | ICD-10-CM | POA: Diagnosis present

## 2016-04-16 DIAGNOSIS — E118 Type 2 diabetes mellitus with unspecified complications: Secondary | ICD-10-CM | POA: Diagnosis present

## 2016-04-16 DIAGNOSIS — E119 Type 2 diabetes mellitus without complications: Secondary | ICD-10-CM | POA: Diagnosis present

## 2016-04-16 DIAGNOSIS — G40909 Epilepsy, unspecified, not intractable, without status epilepticus: Secondary | ICD-10-CM | POA: Diagnosis present

## 2016-04-16 DIAGNOSIS — M1990 Unspecified osteoarthritis, unspecified site: Secondary | ICD-10-CM | POA: Diagnosis present

## 2016-04-16 DIAGNOSIS — I10 Essential (primary) hypertension: Secondary | ICD-10-CM | POA: Diagnosis present

## 2016-04-16 DIAGNOSIS — D696 Thrombocytopenia, unspecified: Secondary | ICD-10-CM | POA: Diagnosis present

## 2016-04-16 DIAGNOSIS — K859 Acute pancreatitis without necrosis or infection, unspecified: Secondary | ICD-10-CM

## 2016-04-16 DIAGNOSIS — E876 Hypokalemia: Secondary | ICD-10-CM | POA: Diagnosis not present

## 2016-04-16 LAB — COMPREHENSIVE METABOLIC PANEL
ALBUMIN: 4.8 g/dL (ref 3.5–5.0)
ALT: 50 U/L (ref 17–63)
AST: 67 U/L — AB (ref 15–41)
Alkaline Phosphatase: 95 U/L (ref 38–126)
Anion gap: 28 — ABNORMAL HIGH (ref 5–15)
BUN: 10 mg/dL (ref 6–20)
CHLORIDE: 101 mmol/L (ref 101–111)
CO2: 7 mmol/L — ABNORMAL LOW (ref 22–32)
Calcium: 9.9 mg/dL (ref 8.9–10.3)
Creatinine, Ser: 1.56 mg/dL — ABNORMAL HIGH (ref 0.61–1.24)
GFR calc Af Amer: 49 mL/min — ABNORMAL LOW (ref >60)
GFR calc non Af Amer: 42 mL/min — ABNORMAL LOW (ref >60)
GLUCOSE: 149 mg/dL — AB (ref 65–99)
POTASSIUM: 4.3 mmol/L (ref 3.5–5.1)
SODIUM: 136 mmol/L (ref 135–145)
Total Bilirubin: 1.7 mg/dL — ABNORMAL HIGH (ref 0.3–1.2)
Total Protein: 9 g/dL — ABNORMAL HIGH (ref 6.5–8.1)

## 2016-04-16 LAB — CBC
HEMATOCRIT: 45.6 % (ref 39.0–52.0)
Hemoglobin: 15.2 g/dL (ref 13.0–17.0)
MCH: 31.5 pg (ref 26.0–34.0)
MCHC: 33.3 g/dL (ref 30.0–36.0)
MCV: 94.6 fL (ref 78.0–100.0)
Platelets: 95 10*3/uL — ABNORMAL LOW (ref 150–400)
RBC: 4.82 MIL/uL (ref 4.22–5.81)
RDW: 17.3 % — AB (ref 11.5–15.5)
WBC: 7.4 10*3/uL (ref 4.0–10.5)

## 2016-04-16 LAB — BASIC METABOLIC PANEL
Anion gap: 21 — ABNORMAL HIGH (ref 5–15)
BUN: 10 mg/dL (ref 6–20)
CHLORIDE: 106 mmol/L (ref 101–111)
CO2: 9 mmol/L — AB (ref 22–32)
CREATININE: 1.63 mg/dL — AB (ref 0.61–1.24)
Calcium: 9.3 mg/dL (ref 8.9–10.3)
GFR calc non Af Amer: 40 mL/min — ABNORMAL LOW (ref >60)
GFR, EST AFRICAN AMERICAN: 46 mL/min — AB (ref >60)
Glucose, Bld: 132 mg/dL — ABNORMAL HIGH (ref 65–99)
POTASSIUM: 4 mmol/L (ref 3.5–5.1)
Sodium: 136 mmol/L (ref 135–145)

## 2016-04-16 LAB — URINALYSIS, ROUTINE W REFLEX MICROSCOPIC
GLUCOSE, UA: NEGATIVE mg/dL
LEUKOCYTES UA: NEGATIVE
Nitrite: NEGATIVE
PH: 6 (ref 5.0–8.0)
Protein, ur: 100 mg/dL — AB
SPECIFIC GRAVITY, URINE: 1.023 (ref 1.005–1.030)

## 2016-04-16 LAB — URINE MICROSCOPIC-ADD ON

## 2016-04-16 LAB — I-STAT CG4 LACTIC ACID, ED: Lactic Acid, Venous: 1.47 mmol/L (ref 0.5–1.9)

## 2016-04-16 LAB — LIPASE, BLOOD: LIPASE: 331 U/L — AB (ref 11–51)

## 2016-04-16 MED ORDER — THIAMINE HCL 100 MG/ML IJ SOLN
Freq: Once | INTRAVENOUS | Status: AC
  Administered 2016-04-17: 01:00:00 via INTRAVENOUS
  Filled 2016-04-16: qty 1000

## 2016-04-16 MED ORDER — M.V.I. ADULT IV INJ: Freq: Once | INTRAVENOUS | Status: DC

## 2016-04-16 MED ORDER — ONDANSETRON 4 MG PO TBDP
4.0000 mg | ORAL_TABLET | Freq: Once | ORAL | Status: AC | PRN
  Administered 2016-04-16: 4 mg via ORAL

## 2016-04-16 MED ORDER — ONDANSETRON 4 MG PO TBDP
ORAL_TABLET | ORAL | Status: AC
  Filled 2016-04-16: qty 1

## 2016-04-16 MED ORDER — LEVETIRACETAM 500 MG PO TABS
500.0000 mg | ORAL_TABLET | Freq: Once | ORAL | Status: AC
  Administered 2016-04-16: 500 mg via ORAL
  Filled 2016-04-16: qty 1

## 2016-04-16 MED ORDER — SODIUM CHLORIDE 0.9 % IV BOLUS (SEPSIS)
1000.0000 mL | Freq: Once | INTRAVENOUS | Status: AC
  Administered 2016-04-16: 1000 mL via INTRAVENOUS

## 2016-04-16 MED ORDER — SODIUM CHLORIDE 0.9 % IV BOLUS (SEPSIS)
1000.0000 mL | Freq: Once | INTRAVENOUS | Status: AC
Start: 2016-04-16 — End: 2016-04-17
  Administered 2016-04-16: 1000 mL via INTRAVENOUS

## 2016-04-16 NOTE — ED Notes (Signed)
Patient attempted to provide an urine specimen without success

## 2016-04-16 NOTE — ED Provider Notes (Signed)
[facility] DEPT Provider Note   CSN: [id number] Arrival date & time: 04/16/16  1700     History   Chief Complaint Chief Complaint  Patient presents with  . Weakness  . Arthritis  . Nausea  . Emesis    HPI Joseph Copeland is a 75 y.o. male with a hx of seizures, on keppra, HTN, and arthritis that presents to the ED noting an insidiously worsening history of weight loss, nausea, decreased appetite. He complains of low back and left hip and knee pains for several months, and states that he has been taking tylenol once a day to only minimal relief of his sx. He admits to drinking ETOH regularly, last drink was Sunday. He states that he drinks occasionally, but his wife then corroborates that he likely drinks more frequently than that and states that he tries to hide it sometimes. After she said this the patient admitted to it. He states that he has not been eating or drinking much other than ETOH for quite a while. States that he feels dehydrated. He denies any vomiting, diarrhea, blood in stool, dysuria, urinary urgency or frequency. He states that over the last few days he has been noticing decreased urination and darkening of it when he does go. Denies any history of cancer. He denies any history of DT's. He denies taking any other illicit drugs or drinking anything other than ETOH. He denies any SI, HI, AVH.  HPI  Past Medical History:  Diagnosis Date  . Arthritis   . Diabetes mellitus    type 2-no meds  . Glaucoma   . Hypertension   . Low testosterone   . Seizures ([place])    first12/12-none since  . Sleep apnea    nasal CPAP at HS    Patient Active Problem List   Diagnosis Date Noted  . Dehydration 04/17/2016  . Metabolic acidosis 0000000  . Malnutrition of moderate degree 01/19/2016  . Orthostatic hypotension 01/16/2016  . Dysphagia 01/16/2016  . Tachycardia 01/16/2016  . Weight loss 01/16/2016  . Seizure disorder ([place]) 01/16/2016  . Elevated transaminase level     . Hypokalemia   . Alcohol dependence ([place]) 01/18/2013  . Onychomycosis 12/01/2012  . Pain in joint, ankle and foot 12/01/2012  . UTI (lower urinary tract infection) 02/09/2012  . Seizures, generalized convulsive ([place]) 06/03/2011  . HTN (hypertension) 06/03/2011  . Diabetes mellitus with complication ([place]) [id number]  . Anemia 06/03/2011    Past Surgical History:  Procedure Laterality Date  . COLONOSCOPY    . ESOPHAGOGASTRODUODENOSCOPY N/A 01/17/2016   Procedure: ESOPHAGOGASTRODUODENOSCOPY (EGD);  Surgeon: Wilford Corner, MD;  Location: [facility] ENDOSCOPY;  Service: Endoscopy;  Laterality: N/A;  . hemrhoidectomy  1988  . LIPOSUCTION  10/07/2011   Procedure: LIPOSUCTION;  Surgeon: Cristine Polio, MD;  Location: [facility];  Service: Plastics;  Laterality: Left;  Marland Kitchen MASS EXCISION  10/07/2011   Procedure: EXCISION MASS;  Surgeon: Cristine Polio, MD;  Location: [facility];  Service: Plastics;  Laterality: Left;  excision of large mass on left back with possible lipo assistence       Home Medications    Prior to Admission medications   Medication Sig Start Date End Date Taking? Authorizing Provider  Amino Acids-Protein Hydrolys (FEEDING SUPPLEMENT, PRO-STAT SUGAR FREE 64,) LIQD Take 30 mLs by mouth 2 (two) times daily. 01/21/16  Yes Maryann Mikhail, DO  ferrous sulfate 325 (65 FE) MG tablet Take 1 tablet (325 mg total) by mouth 2 (two) times daily  with a meal. 01/21/16  Yes Maryann Mikhail, DO  folic acid (FOLVITE) 1 MG tablet Take 1 tablet (1 mg total) by mouth daily. 01/21/16  Yes Maryann Mikhail, DO  lactose free nutrition (BOOST) LIQD Take 237 mLs by mouth daily.   Yes Historical Provider, MD  levETIRAcetam (KEPPRA) 500 MG tablet Take 1 tablet (500 mg total) by mouth 2 (two) times daily. 11/28/15  Yes Dennie Bible, NP  magnesium oxide (MAG-OX) 400 (241.3 Mg) MG tablet Take 1 tablet (400 mg total) by mouth daily. 01/21/16  Yes Maryann Mikhail, DO   metoprolol tartrate (LOPRESSOR) 25 MG tablet Take 25 mg by mouth daily.   Yes Historical Provider, MD  Multiple Vitamin (MULTIVITAMIN WITH MINERALS) TABS tablet Take 1 tablet by mouth daily. 01/21/16  Yes Maryann Mikhail, DO  pantoprazole (PROTONIX) 40 MG tablet Take 1 tablet (40 mg total) by mouth 2 (two) times daily. 01/21/16  Yes Maryann Mikhail, DO  potassium chloride (K-DUR) 10 MEQ tablet Take 1 tablet (10 mEq total) by mouth daily. 01/21/16  Yes Maryann Mikhail, DO  thiamine 100 MG tablet Take 1 tablet (100 mg total) by mouth daily. 01/21/16  Yes Maryann Mikhail, DO  polyethylene glycol (MIRALAX / GLYCOLAX) packet Take 17 g by mouth daily as needed for mild constipation. Patient not taking: Reported on 04/16/2016 01/21/16   Maryann Mikhail, DO  sucralfate (CARAFATE) 1 GM/10ML suspension Take 10 mLs (1 g total) by mouth 4 (four) times daily -  with meals and at bedtime. Patient not taking: Reported on 04/16/2016 01/21/16   Velta Addison Mikhail, DO  testosterone (ANDROGEL) 50 MG/5GM GEL Place 5 g onto the skin daily.    Historical Provider, MD    Family History Family History  Problem Relation Age of Onset  . Alcohol abuse Father   . Alcohol abuse Paternal Grandfather     Social History Social History  Substance Use Topics  . Smoking status: Former Smoker    Quit date: 10/03/1966  . Smokeless tobacco: Never Used     Comment: quit smoking 1971  . Alcohol use 12.6 oz/week    21 Shots of liquor per week     Comment: occ     Allergies   Review of patient's allergies indicates no known allergies.   Review of Systems Review of Systems  Constitutional: Positive for activity change, appetite change and unexpected weight change. Negative for chills and fever.  HENT: Negative for congestion, rhinorrhea, sinus pressure, sneezing and sore throat.   Respiratory: Negative for cough, chest tightness and shortness of breath.   Cardiovascular: Negative for chest pain.  Gastrointestinal: Positive  for nausea. Negative for abdominal pain, blood in stool, constipation, diarrhea and vomiting.  Genitourinary: Positive for decreased urine volume. Negative for dysuria, frequency and hematuria.  Musculoskeletal: Positive for arthralgias (left hip, knee) and back pain.  Skin: Negative for rash.  Neurological: Negative for seizures, syncope, weakness, numbness and headaches.  Psychiatric/Behavioral: Negative for suicidal ideas.  All other systems reviewed and are negative.    Physical Exam Updated Vital Signs BP 135/76 (BP Location: Left Arm)   Pulse 85   Temp 98 F (36.7 C) (Oral)   Resp 17   Ht 5\' 2"  (1.575 m)   Wt 56.5 kg   SpO2 99%   BMI 22.77 kg/m   Physical Exam  Constitutional: He is oriented to person, place, and time. No distress.  Slightly cachectic, chronically ill appearing  HENT:  Head: Normocephalic and atraumatic.  Nose: Nose normal.  MM dry, oropharynx clear   Eyes: Conjunctivae and EOM are normal. Pupils are equal, round, and reactive to light.  Neck: Normal range of motion. Neck supple.  Cardiovascular: Normal rate, regular rhythm, normal heart sounds and intact distal pulses.   Pulmonary/Chest: Effort normal and breath sounds normal.  Abdominal: Soft. He exhibits no distension. There is no tenderness.  Musculoskeletal: He exhibits no edema, tenderness (notes joint pain with weight bearing but not with direct palpation) or deformity.  Neurological: He is alert and oriented to person, place, and time. No cranial nerve deficit. Coordination normal.  Skin: Skin is warm and dry. He is not diaphoretic.  Nursing note and vitals reviewed.    ED Treatments / Results  Labs (all labs ordered are listed, but only abnormal results are displayed) Labs Reviewed  LIPASE, BLOOD - Abnormal; Notable for the following:       Result Value   Lipase 331 (*)    All other components within normal limits  COMPREHENSIVE METABOLIC PANEL - Abnormal; Notable for the following:      CO2 7 (*)    Glucose, Bld 149 (*)    Creatinine, Ser 1.56 (*)    Total Protein 9.0 (*)    AST 67 (*)    Total Bilirubin 1.7 (*)    GFR calc non Af Amer 42 (*)    GFR calc Af Amer 49 (*)    Anion gap 28 (*)    All other components within normal limits  CBC - Abnormal; Notable for the following:    RDW 17.3 (*)    Platelets 95 (*)    All other components within normal limits  URINALYSIS, ROUTINE W REFLEX MICROSCOPIC (NOT AT [facility]) - Abnormal; Notable for the following:    Color, Urine AMBER (*)    APPearance CLOUDY (*)    Hgb urine dipstick MODERATE (*)    Bilirubin Urine MODERATE (*)    Ketones, ur >80 (*)    Protein, ur 100 (*)    All other components within normal limits  BASIC METABOLIC PANEL - Abnormal; Notable for the following:    CO2 9 (*)    Glucose, Bld 132 (*)    Creatinine, Ser 1.63 (*)    GFR calc non Af Amer 40 (*)    GFR calc Af Amer 46 (*)    Anion gap 21 (*)    All other components within normal limits  VOLATILES,BLD-ACETONE,ETHANOL,ISOPROP,METHANOL - Abnormal; Notable for the following:    Acetone, blood 0.024 (*)    All other components within normal limits  URINE MICROSCOPIC-ADD ON - Abnormal; Notable for the following:    Squamous Epithelial / LPF 0-5 (*)    Bacteria, UA FEW (*)    Casts HYALINE CASTS (*)    All other components within normal limits  BASIC METABOLIC PANEL - Abnormal; Notable for the following:    CO2 15 (*)    Glucose, Bld 137 (*)    Creatinine, Ser 1.25 (*)    GFR calc non Af Amer 55 (*)    All other components within normal limits  CBC - Abnormal; Notable for the following:    RDW 17.4 (*)    Platelets 76 (*)    All other components within normal limits  ETHANOL  RAPID URINE DRUG SCREEN, HOSP PERFORMED  I-STAT CG4 LACTIC ACID, ED    EKG  EKG Interpretation None       Radiology US Abdomen Complete  Result Date: 04/17/2016 CLINICAL DATA:  Pancreatitis EXAM: ABDOMEN ULTRASOUND COMPLETE COMPARISON:  02/10/2012  FINDINGS: Gallbladder: There is layering sludge in the gallbladder. There are some hyperechoic foci within the sludge without shadowing of unknown significance. Non shadowing calculi are a consideration. No wall thickening or Soderberg's sign. Common bile duct: Diameter: 3.7 mm. Liver: The liver is diffusely heterogeneous and hyperechoic without focal mass. There is a small 1.0 cm cyst in the left lobe. IVC: No abnormality visualized. Pancreas: Visualized portion unremarkable. Spleen: Size and appearance within normal limits. Right Kidney: Length: 10.9 cm. Echogenicity within normal limits. No mass or hydronephrosis visualized. There is a 0.8 cm benign appearing cyst in the lower pole. Left Kidney: Length: 10.5 cm. Echogenicity within normal limits. No mass or hydronephrosis visualized. There is a central cystic lesion measuring 2.7 x 1.3 x 1.6 cm. It has a lobulated appearance and is not significantly changed compared with the prior CT dated July 25th of this year. There is an adjacent interpolar simple cyst measuring 0.8 cm. Abdominal aorta: Maximal aortic diameter is 2.6 cm. Other findings: None. IMPRESSION: There is sludge in the gallbladder. There are hyperechoic foci representing either hyperechoic sludge or non shadowing calculi. Heterogeneous liver compatible with diffuse hepatic steatosis occur diffuse hepatic parenchymal disease. There are cysts in both kidneys as described. These are not significantly changed. Maximal aortic caliber is 2.6 cm. Ectatic abdominal aorta at risk for aneurysm development. Recommend followup by ultrasound in 5 years. This recommendation follows ACR consensus guidelines: White Paper of the ACR Incidental Findings Committee II on Vascular Findings. J Am Coll Radiol 2013; 10:789-794. Electronically Signed   By: Marybelle Killings M.D.   On: 04/17/2016 09:53    Procedures Procedures (including critical care time)  Medications Ordered in ED Medications  ondansetron (ZOFRAN-ODT) 4 MG  disintegrating tablet (not administered)  levETIRAcetam (KEPPRA) tablet 500 mg (500 mg Oral Given 04/17/16 0945)  LORazepam (ATIVAN) tablet 1 mg (not administered)    Or  LORazepam (ATIVAN) injection 1 mg (not administered)  thiamine (VITAMIN B-1) tablet 100 mg (100 mg Oral Given 04/17/16 0945)    Or  thiamine (B-1) injection 100 mg ( Intravenous See Alternative [id number])  folic acid (FOLVITE) tablet 1 mg (1 mg Oral Given 04/17/16 0945)  multivitamin with minerals tablet 1 tablet (1 tablet Oral Given 04/17/16 0945)  LORazepam (ATIVAN) injection 0-4 mg (0 mg Intravenous Not Given 04/17/16 0600)    Followed by  LORazepam (ATIVAN) injection 0-4 mg (not administered)  0.9 %  sodium chloride infusion (not administered)  ondansetron (ZOFRAN) tablet 4 mg (not administered)    Or  ondansetron (ZOFRAN) injection 4 mg (not administered)  ondansetron (ZOFRAN-ODT) disintegrating tablet 4 mg (4 mg Oral Given 04/16/16 1718)  sodium chloride 0.9 % bolus 1,000 mL (0 mLs Intravenous Stopped 04/16/16 2232)  levETIRAcetam (KEPPRA) tablet 500 mg (500 mg Oral Given 04/16/16 2123)  sodium chloride 0.9 % bolus 1,000 mL (0 mLs Intravenous Stopped 04/17/16 0151)  sodium chloride 0.9 % 1,000 mL with thiamine 123XX123 mg, folic acid 1 mg, multivitamins adult 10 mL infusion ( Intravenous New Bag/Given 04/17/16 0100)     Initial Impression / Assessment and Plan / ED Course  I have reviewed the triage vital signs and the nursing notes.  Pertinent labs & imaging results that were available during my care of the patient were reviewed by me and considered in my medical decision making (see chart for details).  Clinical Course   75 y.o. male presents to the ED noting decreased PO  intake, weight loss, nausea, opting for ETOH instead of food and water. Exam as above appearing dehydrated and mildly cachectic but otherwise in NAD. Initially tachycardic, given 1 L of IV fluids and improved.  Labs were significant for AKI  with Cr 1.56, and anion gap metabolic acidosis with bicarb 7, anion gap 28. Lactic acid normal. Glucose 149. ETOH negative. CBC shows thrombocytopenia at 95, but no leukocytosis or anemia. Lipase 331, but nontender abdomen, likely chronic. Blood volatiles was ordered.  He was given 2L of IV fluids in the ED and a banana bag.   He was then admitted to the hospitalist for further care and assessment.   Final Clinical Impressions(s) / ED Diagnoses   Final diagnoses:  Metabolic acidosis  Dehydration  AKI (acute kidney injury) [facility])    New Prescriptions Current Discharge Medication List       Zenovia Jarred, DO 04/17/16 1008    Orlie Dakin, MD 04/18/16 2111

## 2016-04-16 NOTE — ED Triage Notes (Signed)
Pt c/o weakness, unsteady and arthrotic pain in back, hips, and wrist. Pt also states he hasn't eaten anything in a couple of days. Pt reports n/v, can't hold down food or fluids. Denies diarrhea, constipation.

## 2016-04-16 NOTE — ED Notes (Signed)
Dr. Jones at bedside.

## 2016-04-17 ENCOUNTER — Other Ambulatory Visit (HOSPITAL_COMMUNITY): Payer: Medicare Other

## 2016-04-17 ENCOUNTER — Encounter (HOSPITAL_COMMUNITY): Payer: Self-pay | Admitting: General Practice

## 2016-04-17 ENCOUNTER — Observation Stay (HOSPITAL_COMMUNITY): Payer: Medicare Other

## 2016-04-17 DIAGNOSIS — E118 Type 2 diabetes mellitus with unspecified complications: Secondary | ICD-10-CM

## 2016-04-17 DIAGNOSIS — G40909 Epilepsy, unspecified, not intractable, without status epilepticus: Secondary | ICD-10-CM

## 2016-04-17 DIAGNOSIS — E860 Dehydration: Secondary | ICD-10-CM | POA: Diagnosis present

## 2016-04-17 DIAGNOSIS — E872 Acidosis, unspecified: Secondary | ICD-10-CM | POA: Diagnosis present

## 2016-04-17 DIAGNOSIS — N179 Acute kidney failure, unspecified: Secondary | ICD-10-CM

## 2016-04-17 LAB — BASIC METABOLIC PANEL
ANION GAP: 14 (ref 5–15)
BUN: 9 mg/dL (ref 6–20)
CHLORIDE: 108 mmol/L (ref 101–111)
CO2: 15 mmol/L — AB (ref 22–32)
CREATININE: 1.25 mg/dL — AB (ref 0.61–1.24)
Calcium: 9.1 mg/dL (ref 8.9–10.3)
GFR, EST NON AFRICAN AMERICAN: 55 mL/min — AB (ref >60)
GLUCOSE: 137 mg/dL — AB (ref 65–99)
Potassium: 4.1 mmol/L (ref 3.5–5.1)
Sodium: 137 mmol/L (ref 135–145)

## 2016-04-17 LAB — CBC
HEMATOCRIT: 42 % (ref 39.0–52.0)
Hemoglobin: 14.1 g/dL (ref 13.0–17.0)
MCH: 31.6 pg (ref 26.0–34.0)
MCHC: 33.6 g/dL (ref 30.0–36.0)
MCV: 94.2 fL (ref 78.0–100.0)
PLATELETS: 76 10*3/uL — AB (ref 150–400)
RBC: 4.46 MIL/uL (ref 4.22–5.81)
RDW: 17.4 % — AB (ref 11.5–15.5)
WBC: 6.6 10*3/uL (ref 4.0–10.5)

## 2016-04-17 LAB — ETHANOL

## 2016-04-17 LAB — VOLATILES,BLD-ACETONE,ETHANOL,ISOPROP,METHANOL
Acetone, blood: 0.024 % — ABNORMAL HIGH (ref 0.000–0.010)
ETHANOL, BLOOD: NEGATIVE % (ref 0.000–0.010)
ISOPROPANOL, BLOOD: NEGATIVE % (ref 0.000–0.010)
METHANOL, BLOOD: NEGATIVE % (ref 0.000–0.010)

## 2016-04-17 LAB — RAPID URINE DRUG SCREEN, HOSP PERFORMED
Amphetamines: NOT DETECTED
Barbiturates: NOT DETECTED
Benzodiazepines: NOT DETECTED
Cocaine: NOT DETECTED
OPIATES: NOT DETECTED
Tetrahydrocannabinol: NOT DETECTED

## 2016-04-17 LAB — BLOOD GAS, ARTERIAL
ACID-BASE DEFICIT: 12.6 mmol/L — AB (ref 0.0–2.0)
Bicarbonate: 12.2 mmol/L — ABNORMAL LOW (ref 20.0–28.0)
DRAWN BY: 331761
FIO2: 21
O2 SAT: 96.3 %
PATIENT TEMPERATURE: 98
PCO2 ART: 24 mmHg — AB (ref 32.0–48.0)
PO2 ART: 81 mmHg — AB (ref 83.0–108.0)
pH, Arterial: 7.327 — ABNORMAL LOW (ref 7.350–7.450)

## 2016-04-17 MED ORDER — SODIUM CHLORIDE 0.9 % IV SOLN
INTRAVENOUS | Status: AC
  Administered 2016-04-17: 08:00:00 via INTRAVENOUS

## 2016-04-17 MED ORDER — LORAZEPAM 1 MG PO TABS: 1.0000 mg | ORAL_TABLET | Freq: Four times a day (QID) | ORAL | Status: AC | PRN

## 2016-04-17 MED ORDER — ONDANSETRON HCL 4 MG PO TABS: 4.0000 mg | ORAL_TABLET | Freq: Four times a day (QID) | ORAL | Status: DC | PRN

## 2016-04-17 MED ORDER — ENSURE ENLIVE PO LIQD
237.0000 mL | Freq: Two times a day (BID) | ORAL | Status: DC
  Administered 2016-04-18 – 2016-04-22 (×5): 237 mL via ORAL

## 2016-04-17 MED ORDER — ADULT MULTIVITAMIN W/MINERALS CH
1.0000 | ORAL_TABLET | Freq: Every day | ORAL | Status: DC
  Administered 2016-04-17 – 2016-04-22 (×6): 1 via ORAL
  Filled 2016-04-17 (×6): qty 1

## 2016-04-17 MED ORDER — VITAMIN B-1 100 MG PO TABS
100.0000 mg | ORAL_TABLET | Freq: Every day | ORAL | Status: DC
  Administered 2016-04-17 – 2016-04-22 (×6): 100 mg via ORAL
  Filled 2016-04-17 (×6): qty 1

## 2016-04-17 MED ORDER — LORAZEPAM 2 MG/ML IJ SOLN: 1.0000 mg | Freq: Four times a day (QID) | INTRAMUSCULAR | Status: AC | PRN

## 2016-04-17 MED ORDER — LORAZEPAM 2 MG/ML IJ SOLN: 0.0000 mg | Freq: Four times a day (QID) | INTRAMUSCULAR | Status: AC

## 2016-04-17 MED ORDER — LORAZEPAM 2 MG/ML IJ SOLN: 0.0000 mg | Freq: Two times a day (BID) | INTRAMUSCULAR | Status: AC

## 2016-04-17 MED ORDER — THIAMINE HCL 100 MG/ML IJ SOLN: 100.0000 mg | Freq: Every day | INTRAMUSCULAR | Status: DC

## 2016-04-17 MED ORDER — LEVETIRACETAM 500 MG PO TABS
500.0000 mg | ORAL_TABLET | Freq: Two times a day (BID) | ORAL | Status: DC
  Administered 2016-04-17 – 2016-04-22 (×11): 500 mg via ORAL
  Filled 2016-04-17 (×7): qty 1
  Filled 2016-04-17: qty 2
  Filled 2016-04-17 (×3): qty 1

## 2016-04-17 MED ORDER — FOLIC ACID 1 MG PO TABS
1.0000 mg | ORAL_TABLET | Freq: Every day | ORAL | Status: DC
  Administered 2016-04-17 – 2016-04-22 (×6): 1 mg via ORAL
  Filled 2016-04-17 (×6): qty 1

## 2016-04-17 MED ORDER — ONDANSETRON HCL 4 MG/2ML IJ SOLN: 4.0000 mg | Freq: Four times a day (QID) | INTRAMUSCULAR | Status: DC | PRN

## 2016-04-17 NOTE — Care Management Note (Signed)
Case Management Note  Patient Details  Name: Joseph Copeland MRN: [id number] Date of Birth: 25-Feb-1941  Subjective/Objective:                 Patient form home with wife, in [place] for dehydration. ETOH withdraw last drink 48 hours ago, on CIWA. Patient has CPAP at home- states he doesn;t use it.    Action/Plan:  CM will continue to follow for DC planning, wife not at bedside at this time.   Expected Discharge Date:  04/23/16               Expected Discharge Plan:  Home/Self Care  In-House Referral:     Discharge planning Services  CM Consult  Post Acute Care Choice:    Choice offered to:     DME Arranged:    DME Agency:     HH Arranged:    HH Agency:     Status of Service:  In process, will continue to follow  If discussed at Long Length of Stay Meetings, dates discussed:    Additional Comments:  Carles Collet, RN 04/17/2016, 1:30 PM

## 2016-04-17 NOTE — Progress Notes (Signed)
Initial Nutrition Assessment  DOCUMENTATION CODES:   Not applicable  INTERVENTION:   -Ensure Enlive po BID, each supplement provides 350 kcal and 20 grams of protein  NUTRITION DIAGNOSIS:   Inadequate oral intake related to poor appetite as evidenced by per patient/family report.  GOAL:   Patient will meet greater than or equal to 90% of their needs  MONITOR:   PO intake, Supplement acceptance, Labs, Weight trends, Skin, I & O's  REASON FOR ASSESSMENT:   Malnutrition Screening Tool    ASSESSMENT:   Joseph Copeland is a 75 y.o. male with medical history significant of alcohol abuse, esophagitis, seizures comes in for a month of not eating well, not feeling well found to be dehydrated.  Pt denies abdominal pain, vomiting blood.  No n/v/d.  He recently had a GI work up diagnosed with esophagitis.  Pt drinks etoh daily.  Pt found to be dehydrated with AKI.  Referred for admission for his dehydration.  Pt admitted with dehydration with AKI.   Per H&P, pt with hx of ETOH abuse and recently was diagnosed with esophagitis per GI service.   Spoke with pt at bedside. Pt reports he typically has a good appetite and consumes 2 meals per day (Brunch: pizza and Boost, Dinner: [employer] sandwich). Pt shares that appetite has significantly declined over the past 2 weeks and has been eating about 50% less.   Pt reports UBW is around 135# and estimates a 12# wt loss over the past 2-3 weeks. However, this is not consistent with wt hx. No significant wt changes over the past year.   Pt reports that he consumed about 50% of his breakfast and that "it's gonna take awhile for me to get back to eating right". Discussed importance of good meal completion to promote healing. Also encouraged pt to continue supplementation at home to promote adequate intake. Pt amenable to Ensure supplements.   Nutrition-Focused physical exam completed. Findings are no fat depletion, mld to moderate muscle  depletion, and no edema. Muscle depletion noted in lower extremities only; pt admits to being less active.   Medications reviewed and include MVI, vitamin B-1, and folvite.   Labs reviewed.   Diet Order:  Diet Heart Room service appropriate? Yes; Fluid consistency: Thin  Skin:  Reviewed, no issues  Last BM:  04/15/16  Height:   Ht Readings from Last 1 Encounters:  04/17/16 5\' 2"  (1.575 m)    Weight:   Wt Readings from Last 1 Encounters:  04/17/16 124 lb 8 oz (56.5 kg)    Ideal Body Weight:  53.6 kg  BMI:  Body mass index is 22.77 kg/m.  Estimated Nutritional Needs:   Kcal:  1500-1700  Protein:  75-90 grams  Fluid:  1.5-1.7 L  EDUCATION NEEDS:   Education needs addressed  Daleigh Pollinger A. Jimmye Norman, RD, LDN, CDE Pager: [phone] After hours Pager: [phone]

## 2016-04-17 NOTE — Care Management Obs Status (Signed)
[facility] NOTIFICATION   Patient Details  Name: Rollyn Howery MRN: [id number] Date of Birth: 19-Feb-1941   Medicare Observation Status Notification Given:  Yes    Carles Collet, RN 04/17/2016, 1:29 PM

## 2016-04-17 NOTE — Progress Notes (Signed)
Attempted to get report- RN taking another patient to floor. Will call this RN back.

## 2016-04-17 NOTE — ED Provider Notes (Addendum)
Patient complains of generalized weakness for approximately the past 2 weeks and diminished appetite. He states he last ate 3 days ago. He last drank alcohol 2 days ago. He also complains of pain in his back and right hip for several months, worse with moving and improved with rest typical of arthritis. Denies any fever. No other associated symptoms. On exam chronically ill-appearing, alert Glasgow Coma Score 15 HEENT exam mucous members dry neck supple trachea midline abdomen distended, nontender lungs clear auscultation extremities without edema skin warm and dry. Patient noted to have anion gap metabolic acidosis and acute kidney injury   Orlie Dakin, MD 04/17/16 0010    Orlie Dakin, MD 04/17/16 [id number]

## 2016-04-17 NOTE — Progress Notes (Signed)
  Pt admitted to the unit. Pt is stable, alert and oriented per baseline. Oriented to room, staff, and call bell. Educated to call for any assistance. Bed in lowest position, call bell within reach- will continue to monitor. 

## 2016-04-17 NOTE — Progress Notes (Signed)
Patient was admitted early this AM after Midnight and H and P has been reviewed. Patient found to be dehydrated with AKI with Metabolic Acidosis likely 2/2 to Ketoacidosis from EtOH Consumption. IVF were increased to 125 mL/hr as AKI was improving. Patient on CIWA protocol. U/S of Abdomen was reviewed and showed sludge in gallbladder along with hyperechoic foci representing hyperechoic sludge or non-shadowing calculi. The liver was also heterogenous compatible with Hepatic Steatosis or diffuse hepatic parenchymal disease. There was also kidney cysts that were not significantly changed and showed a maximal aortic caliber of 2.6 cm with follow up ultrasound recommended in 5 years. Lipase was slightly elevated at 331 but Ultrasound of pancreas was normal of what was visualized. Will repeat Lipase in Am as well as repeat CMP. Continue to Monitor.

## 2016-04-17 NOTE — H&P (Signed)
History and Physical    Joseph Copeland [id number] DOB: [birth year] DOA: 04/16/2016  PCP: Henrine Screws, MD  Patient coming from:  home  Chief Complaint:   Not eating well  HPI: Joseph Copeland is a 75 y.o. male with medical history significant of alcohol abuse, esophagitis, seizures comes in for a month of not eating well, not feeling well found to be dehydrated.  Pt denies abdominal pain, vomiting blood.  No n/v/d.  He recently had a GI work up diagnosed with esophagitis.  Pt drinks etoh daily.  Pt found to be dehydrated with AKI.  Referred for admission for his dehydration.  Review of Systems: As per HPI otherwise 10 point review of systems negative.   Past Medical History:  Diagnosis Date  . Arthritis   . Diabetes mellitus    type 2-no meds  . Glaucoma   . Hypertension   . Low testosterone   . Seizures ([place])    first12/12-none since  . Sleep apnea    nasal CPAP at HS    Past Surgical History:  Procedure Laterality Date  . COLONOSCOPY    . ESOPHAGOGASTRODUODENOSCOPY N/A 01/17/2016   Procedure: ESOPHAGOGASTRODUODENOSCOPY (EGD);  Surgeon: Wilford Corner, MD;  Location: [facility];  Service: Endoscopy;  Laterality: N/A;  . hemrhoidectomy  1988  . LIPOSUCTION  10/07/2011   Procedure: LIPOSUCTION;  Surgeon: Cristine Polio, MD;  Location: [facility];  Service: Plastics;  Laterality: Left;  Marland Kitchen MASS EXCISION  10/07/2011   Procedure: EXCISION MASS;  Surgeon: Cristine Polio, MD;  Location: [facility];  Service: Plastics;  Laterality: Left;  excision of large mass on left back with possible lipo assistence     reports that he quit smoking about 49 years ago. He has never used smokeless tobacco. He reports that he drinks about 12.6 oz of alcohol per week . He reports that he does not use drugs.  No Known Allergies  Family History  Problem Relation Age of Onset  . Alcohol abuse Father   . Alcohol abuse Paternal Grandfather     Prior to  Admission medications   Medication Sig Start Date End Date Taking? Authorizing Provider  Amino Acids-Protein Hydrolys (FEEDING SUPPLEMENT, PRO-STAT SUGAR FREE 64,) LIQD Take 30 mLs by mouth 2 (two) times daily. 01/21/16  Yes Maryann Mikhail, DO  ferrous sulfate 325 (65 FE) MG tablet Take 1 tablet (325 mg total) by mouth 2 (two) times daily with a meal. 01/21/16  Yes Maryann Mikhail, DO  folic acid (FOLVITE) 1 MG tablet Take 1 tablet (1 mg total) by mouth daily. 01/21/16  Yes Maryann Mikhail, DO  lactose free nutrition (BOOST) LIQD Take 237 mLs by mouth daily.   Yes Historical Provider, MD  levETIRAcetam (KEPPRA) 500 MG tablet Take 1 tablet (500 mg total) by mouth 2 (two) times daily. 11/28/15  Yes Dennie Bible, NP  magnesium oxide (MAG-OX) 400 (241.3 Mg) MG tablet Take 1 tablet (400 mg total) by mouth daily. 01/21/16  Yes Maryann Mikhail, DO  metoprolol tartrate (LOPRESSOR) 25 MG tablet Take 25 mg by mouth daily.   Yes Historical Provider, MD  Multiple Vitamin (MULTIVITAMIN WITH MINERALS) TABS tablet Take 1 tablet by mouth daily. 01/21/16  Yes Maryann Mikhail, DO  pantoprazole (PROTONIX) 40 MG tablet Take 1 tablet (40 mg total) by mouth 2 (two) times daily. 01/21/16  Yes Maryann Mikhail, DO  potassium chloride (K-DUR) 10 MEQ tablet Take 1 tablet (10 mEq total) by mouth daily. 01/21/16  Yes Cristal Ford,  DO  thiamine 100 MG tablet Take 1 tablet (100 mg total) by mouth daily. 01/21/16  Yes Maryann Mikhail, DO  polyethylene glycol (MIRALAX / GLYCOLAX) packet Take 17 g by mouth daily as needed for mild constipation. Patient not taking: Reported on 04/16/2016 01/21/16   Maryann Mikhail, DO  sucralfate (CARAFATE) 1 GM/10ML suspension Take 10 mLs (1 g total) by mouth 4 (four) times daily -  with meals and at bedtime. Patient not taking: Reported on 04/16/2016 01/21/16   Velta Addison Mikhail, DO  testosterone (ANDROGEL) 50 MG/5GM GEL Place 5 g onto the skin daily.    Historical Provider, MD    Physical  Exam: Vitals:   04/16/16 2100 04/16/16 2300 04/16/16 2330 04/17/16 0000  BP: 132/71 150/87 153/87 140/83  Pulse: 107 102 94 96  Resp:      Temp:      TempSrc:      SpO2: 98% 99% 97% 99%   Constitutional: NAD, calm, comfortable Vitals:   04/16/16 2100 04/16/16 2300 04/16/16 2330 04/17/16 0000  BP: 132/71 150/87 153/87 140/83  Pulse: 107 102 94 96  Resp:      Temp:      TempSrc:      SpO2: 98% 99% 97% 99%   Eyes: PERRL, lids and conjunctivae normal ENMT: Mucous membranes are moist. Posterior pharynx clear of any exudate or lesions.Normal dentition.  Neck: normal, supple, no masses, no thyromegaly Respiratory: clear to auscultation bilaterally, no wheezing, no crackles. Normal respiratory effort. No accessory muscle use.  Cardiovascular: Regular rate and rhythm, no murmurs / rubs / gallops. No extremity edema. 2+ pedal pulses. No carotid bruits.  Abdomen: no tenderness, no masses palpated. No hepatosplenomegaly. Bowel sounds positive.  Musculoskeletal: no clubbing / cyanosis. No joint deformity upper and lower extremities. Good ROM, no contractures. Normal muscle tone.  Skin: no rashes, lesions, ulcers. No induration Neurologic: CN 2-12 grossly intact. Sensation intact, DTR normal. Strength 5/5 in all 4.  Psychiatric: Normal judgment and insight. Alert and oriented x 3. Normal mood.    Labs on Admission: I have personally reviewed following labs and imaging studies  CBC:  Recent Labs Lab 04/16/16 1738  WBC 7.4  HGB 15.2  HCT 45.6  MCV 94.6  PLT 95*   Basic Metabolic Panel:  Recent Labs Lab 04/16/16 1738 04/16/16 2311  NA 136 136  K 4.3 4.0  CL 101 106  CO2 7* 9*  GLUCOSE 149* 132*  BUN 10 10  CREATININE 1.56* 1.63*  CALCIUM 9.9 9.3   GFR: CrCl cannot be calculated (Unknown ideal weight.). Liver Function Tests:  Recent Labs Lab 04/16/16 1738  AST 67*  ALT 50  ALKPHOS 95  BILITOT 1.7*  PROT 9.0*  ALBUMIN 4.8    Recent Labs Lab 04/16/16 1738   LIPASE 331*    Urine analysis:    Component Value Date/Time   COLORURINE AMBER (A) 04/16/2016 2327   APPEARANCEUR CLOUDY (A) 04/16/2016 2327   LABSPEC 1.023 04/16/2016 2327   PHURINE 6.0 04/16/2016 2327   GLUCOSEU NEGATIVE 04/16/2016 2327   HGBUR MODERATE (A) 04/16/2016 2327   BILIRUBINUR MODERATE (A) 04/16/2016 2327   KETONESUR >80 (A) 04/16/2016 2327   PROTEINUR 100 (A) 04/16/2016 2327   UROBILINOGEN 0.2 07/27/2014 1916   NITRITE NEGATIVE 04/16/2016 2327   LEUKOCYTESUR NEGATIVE 04/16/2016 2327   Radiological Exams on Admission: No results found.   Assessment/Plan 75 yo male with chronic alcoholism comes in with FTT, dehydration, AKI  Principal Problem:   Dehydration with AKI-  unclear his overall FTT is due to his esophagitis, pt has elevated lipase.  Will provide with some ivf.  Will check an abdominal ultrasound.  Has metabolic acidosis, will check ethanol, uds and methanol levels.    Active Problems:  pancreatitits-  Obtain abdominal ultrasound.  ivf.   Metabolic acidosis- as above   Diabetes mellitus with complication (HCC)   Alcohol dependence ([place])- place on CIWA, given bananna bag in ED.   Seizure disorder (HCC)-cont keppra  DVT prophylaxis:  scds Code Status:  full Family Communication: none  Disposition Plan:  Per day team Consults called:  none Admission status:  observation  Med rec pending  Fayette Gasner A MD [facility]  If 7PM-7AM, please contact night-coverage [email or web address] Password [email or web address]  04/17/2016, 12:43 AM

## 2016-04-18 DIAGNOSIS — F10288 Alcohol dependence with other alcohol-induced disorder: Secondary | ICD-10-CM

## 2016-04-18 DIAGNOSIS — N179 Acute kidney failure, unspecified: Secondary | ICD-10-CM | POA: Diagnosis not present

## 2016-04-18 DIAGNOSIS — E860 Dehydration: Secondary | ICD-10-CM | POA: Diagnosis not present

## 2016-04-18 DIAGNOSIS — E872 Acidosis: Secondary | ICD-10-CM | POA: Diagnosis not present

## 2016-04-18 LAB — COMPREHENSIVE METABOLIC PANEL
ALBUMIN: 3.3 g/dL — AB (ref 3.5–5.0)
ALT: 31 U/L (ref 17–63)
ANION GAP: 12 (ref 5–15)
AST: 40 U/L (ref 15–41)
Alkaline Phosphatase: 81 U/L (ref 38–126)
BUN: 7 mg/dL (ref 6–20)
CHLORIDE: 106 mmol/L (ref 101–111)
CO2: 18 mmol/L — AB (ref 22–32)
Calcium: 9.1 mg/dL (ref 8.9–10.3)
Creatinine, Ser: 1.01 mg/dL (ref 0.61–1.24)
GFR calc Af Amer: >60 mL/min (ref >60)
GFR calc non Af Amer: >60 mL/min (ref >60)
GLUCOSE: 124 mg/dL — AB (ref 65–99)
POTASSIUM: 2.9 mmol/L — AB (ref 3.5–5.1)
SODIUM: 136 mmol/L (ref 135–145)
Total Bilirubin: 1.6 mg/dL — ABNORMAL HIGH (ref 0.3–1.2)
Total Protein: 6.9 g/dL (ref 6.5–8.1)

## 2016-04-18 LAB — CBC WITH DIFFERENTIAL/PLATELET
BASOS PCT: 0 %
Basophils Absolute: 0 10*3/uL (ref 0.0–0.1)
EOS ABS: 0.1 10*3/uL (ref 0.0–0.7)
Eosinophils Relative: 1 %
HCT: 42.6 % (ref 39.0–52.0)
HEMOGLOBIN: 14.4 g/dL (ref 13.0–17.0)
Lymphocytes Relative: 11 %
Lymphs Abs: 0.7 10*3/uL (ref 0.7–4.0)
MCH: 31.7 pg (ref 26.0–34.0)
MCHC: 33.8 g/dL (ref 30.0–36.0)
MCV: 93.8 fL (ref 78.0–100.0)
MONO ABS: 0.7 10*3/uL (ref 0.1–1.0)
MONOS PCT: 10 %
NEUTROS PCT: 77 %
Neutro Abs: 5 10*3/uL (ref 1.7–7.7)
Platelets: 73 10*3/uL — ABNORMAL LOW (ref 150–400)
RBC: 4.54 MIL/uL (ref 4.22–5.81)
RDW: 17.1 % — AB (ref 11.5–15.5)
WBC: 6.5 10*3/uL (ref 4.0–10.5)

## 2016-04-18 LAB — PHOSPHORUS: Phosphorus: <1 mg/dL — CL (ref 2.5–4.6)

## 2016-04-18 LAB — MAGNESIUM: Magnesium: 1.5 mg/dL — ABNORMAL LOW (ref 1.7–2.4)

## 2016-04-18 MED ORDER — K PHOS MONO-SOD PHOS DI & MONO 155-852-130 MG PO TABS
500.0000 mg | ORAL_TABLET | Freq: Four times a day (QID) | ORAL | Status: AC
  Administered 2016-04-18 – 2016-04-20 (×8): 500 mg via ORAL
  Filled 2016-04-18 (×9): qty 2

## 2016-04-18 MED ORDER — MAGNESIUM SULFATE 2 GM/50ML IV SOLN
2.0000 g | Freq: Once | INTRAVENOUS | Status: AC
  Administered 2016-04-18: 2 g via INTRAVENOUS
  Filled 2016-04-18: qty 50

## 2016-04-18 MED ORDER — POTASSIUM CHLORIDE 20 MEQ PO PACK
40.0000 meq | PACK | Freq: Two times a day (BID) | ORAL | Status: AC
  Administered 2016-04-18 (×2): 40 meq via ORAL
  Filled 2016-04-18 (×2): qty 2

## 2016-04-18 NOTE — Progress Notes (Signed)
OT Cancellation Note  Patient Details Name: Joseph Copeland MRN: [id number] DOB: Nov 15, 1940   Cancelled Treatment:    Reason Eval/Treat Not Completed: OT screened, no needs identified, will sign off.  Pt ambulated independently with PT.  Pt reports he is able to perform ADLs independently, and has no concerns.  Will sign off.   Lylla Eifler Kent, OTR/L [id number]   Lucille Passy M 04/18/2016, 1:19 PM

## 2016-04-18 NOTE — Progress Notes (Signed)
CRITICAL VALUE ALERT  Critical value received:  Phosphorus <1.0  Date of notification:  10/26  Time of notification:  11:22  Critical value read back: yes   Nurse who received alert: Alex Gardener  MD notified (1st page):  Sheikh  Time of first page:  11:34  MD notified (2nd page):  Time of second page:  Responding MD:  Alfredia Ferguson  Time MD responded:  12:00pm

## 2016-04-18 NOTE — Progress Notes (Signed)
PROGRESS NOTE    Joseph Copeland  [id number] DOB: March 23, 1941 DOA: 04/16/2016 PCP: Henrine Screws, MD   Brief Narrative:  Joseph Copeland is a 75 y.o. male with medical history significant of alcohol abuse, esophagitis, seizures comes in for a month of not eating well, not feeling well found to be dehydrated.  Pt denies abdominal pain, vomiting blood.  No n/v/d.  He recently had a GI work up diagnosed with esophagitis.  Pt drinks Etoh daily.  Pt found to be dehydrated with AKI.  Referred for admission for his dehydration.   Assessment & Plan:   Principal Problem:   Dehydration Active Problems:   Diabetes mellitus with complication (HCC)   Alcohol dependence ([place])   Seizure disorder (HCC)   Metabolic acidosis   AKI (acute kidney injury) ([place])  AKI 2/2 to Dehydration from poor Appetite and po Intake as well as Alcohol Abuse -Improved with IVF Rehydration -Patient's BUN/Cr went from 10/1.63 -> 7/1.01 -Hold All Nephrotoxic Medications -Nutrition Consultation Appreciated -Repeat BMP in AM   Anion Gap Metabolic Acidosis from Ketoacidosis from Alcoholism, improving -Patient had Ketones in blood on Admission -Improved with IVF Rehydration -Gap was 12 this AM -Repeat BMP in AM  Generalized Weakness likely from Dehydration and poor po Intake -PT/OT Eval and Treat -Nutrition Consult for poor po Intake -Counseled on Alcohol Abuse  Alcohol Dependence -Lipase was 331 -LFT's showed AST went from 67 -> 40 and ALT went from 50 -> 31 -U/S of Abdomen showed There is sludge in the gallbladder. There are hyperechoic foci representing either hyperechoic sludge or non shadowing calculi. Heterogeneous liver compatible with diffuse hepatic steatosis occur diffuse hepatic parenchymal disease.There are cysts in both kidneys as described. These are not significantly changed. Maximal aortic caliber is 2.6 cm. Ectatic abdominal aorta at risk for aneurysm development. Recommend followup by ultrasound  in 5 years.  -Continue CIWA protocol with Lorazepam IV Administration -C/w MVI, Thiamine, and Folate -Counseled on Alcohol Abuse  Hypokalemia -Potassium was 2.9 this AM -Replete with K-Dur 40 mEQ BID x 2 doses -Repeat BMP in AM  Hypomagnesemia -Magnesium was 1.5 this AM -Replete with IV Mag Sulfate -Repeat Mag Level in AM  Hypophosphatemia -Phos Level was <1.0 -Replete with K Phos 500 mg po QID for 2 Days -Nutrition Consultation; C/w Esnure BID -Repeat Phos in AM  Thrombocytopenia -Likely 2/2 to Alcohol Abuse -Continue to Monitor for S/Sx of Bleeding -Repeat CBC in AM  Diabetes Mellitus without Complication -Diet Controlled -CBG ranging from 111-144  Seizure Disorder -C/w Levetiracetam 500 mg po BID  DVT prophylaxis: SCD's because of Thrombocytopenia Code Status: Full Family Communication: No family at bedside. Disposition Plan: Home at D/C likely in AM  Consultants:   None  Procedures:  Ultrasound of Abdomen  Antimicrobials:  None  Subjective: Patient was seen and examined at bedside and he was doing better. Felt stronger than yesterday. Denied Cp, SOB, N/V or Abdominal Pain. No other complaints or concerns at this time.   Objective: Vitals:   04/18/16 0500 04/18/16 0626 04/18/16 1343 04/18/16 1400  BP:  118/65 (!) 148/81 134/81  Pulse:  (!) 106 85 84  Resp:  16 18 18   Temp:  98 F (36.7 C) 98.9 F (37.2 C) 98.9 F (37.2 C)  TempSrc:  Oral Oral Oral  SpO2:  97% 97% 99%  Weight: 57.3 kg (126 lb 6.4 oz)     Height:       No intake or output data in the 24 hours ending 04/18/16  [place]   04/17/16 0240 04/18/16 0500  Weight: 56.5 kg (124 lb 8 oz) 57.3 kg (126 lb 6.4 oz)    Examination: Physical Exam:  Constitutional: Thin and mildly cachectic, NAD and appears calm and comfortable Eyes: Lids and conjunctivae normal, sclerae anicteric  ENMT: External Ears, Nose appear normal. Grossly normal hearing. Normal dentition.  Neck: Appears  normal, supple, no cervical masses, normal ROM, no appreciable thyromegaly, no JVD Respiratory: Clear to auscultation bilaterally, no wheezing, rales, rhonchi or crackles. Normal respiratory effort and patient is not tachypenic. No accessory muscle use.  Cardiovascular: RRR, no murmurs / rubs / gallops. S1 and S2 auscultated. No extremity edema. 2+ pedal pulses.  Abdomen: Soft, non-tender, non-distended. No masses palpated. No appreciable hepatosplenomegaly. Bowel sounds positive x4.  GU: Deferred. Musculoskeletal: No clubbing / cyanosis of digits/nails. No joint deformity upper and lower extremities. Good ROM, no contractures. Normal strength and muscle tone.  Skin: No rashes, lesions, ulcers. No induration; Warm and dry.  Neurologic: CN 2-12 grossly intact with no focal deficits. Sensation intact in all 4 Extremities. Strength 5/5 in all 4. Romberg sign cerebellar reflexes not assessed.  Psychiatric: Normal judgment and insight. Alert and oriented x 3. Normal mood and appropriate affect.   Data Reviewed: I have personally reviewed following labs and imaging studies  CBC:  Recent Labs Lab 04/16/16 1738 04/17/16 0656 04/18/16 1044  WBC 7.4 6.6 6.5  NEUTROABS  --   --  5.0  HGB 15.2 14.1 14.4  HCT 45.6 42.0 42.6  MCV 94.6 94.2 93.8  PLT 95* 76* 73*   Basic Metabolic Panel:  Recent Labs Lab 04/16/16 1738 04/16/16 2311 04/17/16 0656 04/18/16 1044  NA 136 136 137 136  K 4.3 4.0 4.1 2.9*  CL 101 106 108 106  CO2 7* 9* 15* 18*  GLUCOSE 149* 132* 137* 124*  BUN 10 10 9 7   CREATININE 1.56* 1.63* 1.25* 1.01  CALCIUM 9.9 9.3 9.1 9.1  MG  --   --   --  1.5*  PHOS  --   --   --  <1.0*   GFR: Estimated Creatinine Clearance: 49.6 mL/min (by C-G formula based on SCr of 1.01 mg/dL). Liver Function Tests:  Recent Labs Lab 04/16/16 1738 04/18/16 1044  AST 67* 40  ALT 50 31  ALKPHOS 95 81  BILITOT 1.7* 1.6*  PROT 9.0* 6.9  ALBUMIN 4.8 3.3*    Recent Labs Lab 04/16/16 1738    LIPASE 331*   No results for input(s): AMMONIA in the last 168 hours. Coagulation Profile: No results for input(s): INR, PROTIME in the last 168 hours. Cardiac Enzymes: No results for input(s): CKTOTAL, CKMB, CKMBINDEX, TROPONINI in the last 168 hours. BNP (last 3 results) No results for input(s): PROBNP in the last 8760 hours. HbA1C: No results for input(s): HGBA1C in the last 72 hours. CBG: No results for input(s): GLUCAP in the last 168 hours. Lipid Profile: No results for input(s): CHOL, HDL, LDLCALC, TRIG, CHOLHDL, LDLDIRECT in the last 72 hours. Thyroid Function Tests: No results for input(s): TSH, T4TOTAL, FREET4, T3FREE, THYROIDAB in the last 72 hours. Anemia Panel: No results for input(s): VITAMINB12, FOLATE, FERRITIN, TIBC, IRON, RETICCTPCT in the last 72 hours. Sepsis Labs:  Recent Labs Lab 04/16/16 2134  LATICACIDVEN 1.47    No results found for this or any previous visit (from the past 240 hour(s)).   Radiology Studies: US Abdomen Complete  Result Date: 04/17/2016 CLINICAL DATA:  Pancreatitis EXAM: ABDOMEN ULTRASOUND COMPLETE  COMPARISON:  02/10/2012 FINDINGS: Gallbladder: There is layering sludge in the gallbladder. There are some hyperechoic foci within the sludge without shadowing of unknown significance. Non shadowing calculi are a consideration. No wall thickening or Rogoff's sign. Common bile duct: Diameter: 3.7 mm. Liver: The liver is diffusely heterogeneous and hyperechoic without focal mass. There is a small 1.0 cm cyst in the left lobe. IVC: No abnormality visualized. Pancreas: Visualized portion unremarkable. Spleen: Size and appearance within normal limits. Right Kidney: Length: 10.9 cm. Echogenicity within normal limits. No mass or hydronephrosis visualized. There is a 0.8 cm benign appearing cyst in the lower pole. Left Kidney: Length: 10.5 cm. Echogenicity within normal limits. No mass or hydronephrosis visualized. There is a central cystic lesion  measuring 2.7 x 1.3 x 1.6 cm. It has a lobulated appearance and is not significantly changed compared with the prior CT dated July 25th of this year. There is an adjacent interpolar simple cyst measuring 0.8 cm. Abdominal aorta: Maximal aortic diameter is 2.6 cm. Other findings: None. IMPRESSION: There is sludge in the gallbladder. There are hyperechoic foci representing either hyperechoic sludge or non shadowing calculi. Heterogeneous liver compatible with diffuse hepatic steatosis occur diffuse hepatic parenchymal disease. There are cysts in both kidneys as described. These are not significantly changed. Maximal aortic caliber is 2.6 cm. Ectatic abdominal aorta at risk for aneurysm development. Recommend followup by ultrasound in 5 years. This recommendation follows ACR consensus guidelines: White Paper of the ACR Incidental Findings Committee II on Vascular Findings. J Am Coll Radiol 2013; 10:789-794. Electronically Signed   By: Marybelle Killings M.D.   On: 04/17/2016 09:53   Scheduled Meds: . feeding supplement (ENSURE ENLIVE)  237 mL Oral BID BM  . folic acid  1 mg Oral Daily  . levETIRAcetam  500 mg Oral BID  . LORazepam  0-4 mg Intravenous Q6H   Followed by  . [START ON 04/19/2016] LORazepam  0-4 mg Intravenous Q12H  . multivitamin with minerals  1 tablet Oral Daily  . phosphorus  500 mg Oral QID  . potassium chloride  40 mEq Oral BID  . thiamine  100 mg Oral Daily   Or  . thiamine  100 mg Intravenous Daily   Continuous Infusions:    LOS: 0 days    Kerney Elbe, DO [facility] Pager [phone]  If 7PM-7AM, please contact night-coverage [email or web address] Password [facility] 04/18/2016, 4:27 PM

## 2016-04-18 NOTE — Progress Notes (Signed)
Physical Therapy Evaluation Patient Details Name: Joseph Copeland MRN: [id number] DOB: 1941-06-24 Today's Date: 04/18/2016   History of Present Illness  Patient presented to the ED with dehuydration. He reported feeling weak and dizzy. He reports feeling much better today. Pertinant PMH DMII, arthiritis, seizures and HTN   Clinical Impression  Patient appears to be at baseline mobility. He had no loss of balance while walking hosuehold distances. He reported no fatigue or shortness of breath. D/C to HEP.     Follow Up Recommendations      Equipment Recommendations       Recommendations for Other Services       Precautions / Restrictions Precautions Precautions: Fall Restrictions Weight Bearing Restrictions: No      Mobility  Bed Mobility Overal bed mobility: Independent             General bed mobility comments: No difficultys with bed mobility   Transfers Overall transfer level: Independent               General transfer comment: Sit to stand transfers with initial report of syncope but no LOB. Syncope cleared in about 5 seconds and patient was able to transfer independently. Patient advised to count to 5 at home.   Ambulation/Gait Ambulation/Gait assistance: Independent Ambulation Distance (Feet): 200 Feet         General Gait Details: Patient able to ambualte with turns and whith turning head with no syncope or loss of balance   Stairs            Wheelchair Mobility    Modified Rankin (Stroke Patients Only)       Balance Overall balance assessment: Independent                                           Pertinent Vitals/Pain Pain Assessment: No/denies pain    Home Copeland Family/patient expects to be discharged to:: Private residence Copeland Arrangements: Spouse/significant other                    Prior Function Level of Independence: Independent               Hand Dominance         Extremity/Trunk Assessment   Upper Extremity Assessment: Overall WFL for tasks assessed           Lower Extremity Assessment: Overall WFL for tasks assessed         Communication   Communication: No difficulties  Cognition Arousal/Alertness: Awake/alert Behavior During Therapy: WFL for tasks assessed/performed Overall Cognitive Status: Within Functional Limits for tasks assessed                      General Comments      Exercises     Assessment/Plan    PT Assessment Patent does not need any further PT services  PT Problem List            PT Treatment Interventions      PT Goals (Current goals can be found in the Care Plan section)  Acute Rehab PT Goals Patient Stated Goal: to go home  PT Goal Formulation: With patient Time For Goal Achievement: 04/25/16 Potential to Achieve Goals: Good    Frequency     Barriers to discharge        Co-evaluation  End of Session Equipment Utilized During Treatment: Gait belt               Time: 1100-1114 PT Time Calculation (min) (ACUTE ONLY): 14 min   Charges:   PT Evaluation $PT Eval Low Complexity: 1 Procedure     PT G Codes:        Joseph Copeland 04/29/2016, 11:26 AM

## 2016-04-19 DIAGNOSIS — E872 Acidosis: Secondary | ICD-10-CM | POA: Diagnosis not present

## 2016-04-19 DIAGNOSIS — E876 Hypokalemia: Secondary | ICD-10-CM

## 2016-04-19 DIAGNOSIS — F10288 Alcohol dependence with other alcohol-induced disorder: Secondary | ICD-10-CM | POA: Diagnosis not present

## 2016-04-19 DIAGNOSIS — E860 Dehydration: Secondary | ICD-10-CM | POA: Diagnosis not present

## 2016-04-19 DIAGNOSIS — N179 Acute kidney failure, unspecified: Secondary | ICD-10-CM | POA: Diagnosis not present

## 2016-04-19 LAB — COMPREHENSIVE METABOLIC PANEL
ALBUMIN: 3.1 g/dL — AB (ref 3.5–5.0)
ALK PHOS: 80 U/L (ref 38–126)
ALT: 28 U/L (ref 17–63)
ANION GAP: 12 (ref 5–15)
AST: 36 U/L (ref 15–41)
BUN: 7 mg/dL (ref 6–20)
CO2: 21 mmol/L — AB (ref 22–32)
Calcium: 9.1 mg/dL (ref 8.9–10.3)
Chloride: 105 mmol/L (ref 101–111)
Creatinine, Ser: 0.91 mg/dL (ref 0.61–1.24)
GFR calc Af Amer: >60 mL/min (ref >60)
GFR calc non Af Amer: >60 mL/min (ref >60)
GLUCOSE: 135 mg/dL — AB (ref 65–99)
POTASSIUM: 2.7 mmol/L — AB (ref 3.5–5.1)
SODIUM: 138 mmol/L (ref 135–145)
Total Bilirubin: 1.5 mg/dL — ABNORMAL HIGH (ref 0.3–1.2)
Total Protein: 6.9 g/dL (ref 6.5–8.1)

## 2016-04-19 LAB — CBC WITH DIFFERENTIAL/PLATELET
BASOS PCT: 0 %
Basophils Absolute: 0 10*3/uL (ref 0.0–0.1)
EOS ABS: 0.1 10*3/uL (ref 0.0–0.7)
Eosinophils Relative: 2 %
HCT: 40.5 % (ref 39.0–52.0)
HEMOGLOBIN: 14 g/dL (ref 13.0–17.0)
Lymphocytes Relative: 14 %
Lymphs Abs: 1 10*3/uL (ref 0.7–4.0)
MCH: 31.5 pg (ref 26.0–34.0)
MCHC: 34.6 g/dL (ref 30.0–36.0)
MCV: 91 fL (ref 78.0–100.0)
MONOS PCT: 15 %
Monocytes Absolute: 1 10*3/uL (ref 0.1–1.0)
NEUTROS PCT: 69 %
Neutro Abs: 4.6 10*3/uL (ref 1.7–7.7)
Platelets: 91 10*3/uL — ABNORMAL LOW (ref 150–400)
RBC: 4.45 MIL/uL (ref 4.22–5.81)
RDW: 16.7 % — AB (ref 11.5–15.5)
WBC: 6.7 10*3/uL (ref 4.0–10.5)

## 2016-04-19 LAB — BASIC METABOLIC PANEL
ANION GAP: 10 (ref 5–15)
BUN: 10 mg/dL (ref 6–20)
CO2: 23 mmol/L (ref 22–32)
Calcium: 9.5 mg/dL (ref 8.9–10.3)
Chloride: 105 mmol/L (ref 101–111)
Creatinine, Ser: 0.76 mg/dL (ref 0.61–1.24)
GFR calc Af Amer: >60 mL/min (ref >60)
Glucose, Bld: 122 mg/dL — ABNORMAL HIGH (ref 65–99)
POTASSIUM: 3.3 mmol/L — AB (ref 3.5–5.1)
SODIUM: 138 mmol/L (ref 135–145)

## 2016-04-19 LAB — MAGNESIUM: Magnesium: 1.7 mg/dL (ref 1.7–2.4)

## 2016-04-19 LAB — BRAIN NATRIURETIC PEPTIDE: B NATRIURETIC PEPTIDE 5: 44.2 pg/mL (ref 0.0–100.0)

## 2016-04-19 LAB — PHOSPHORUS: Phosphorus: 1.3 mg/dL — ABNORMAL LOW (ref 2.5–4.6)

## 2016-04-19 MED ORDER — POTASSIUM CHLORIDE 10 MEQ/100ML IV SOLN
10.0000 meq | INTRAVENOUS | Status: AC
  Administered 2016-04-19 (×4): 10 meq via INTRAVENOUS
  Filled 2016-04-19 (×5): qty 100

## 2016-04-19 MED ORDER — POTASSIUM CHLORIDE CRYS ER 20 MEQ PO TBCR
60.0000 meq | EXTENDED_RELEASE_TABLET | Freq: Once | ORAL | Status: AC
  Administered 2016-04-19: 60 meq via ORAL
  Filled 2016-04-19: qty 3

## 2016-04-19 NOTE — Progress Notes (Signed)
Nutrition Follow-up  DOCUMENTATION CODES:   Not applicable  INTERVENTION:   -Continue Ensure Enlive po BID, each supplement provides 350 kcal and 20 grams of protein -Educated pt on high protein diet and ways to increase intake. Provided "High Calorie, High Protein Nutrition Therapy" handout from [facility] Nutrition Care Manual  NUTRITION DIAGNOSIS:   Inadequate oral intake related to poor appetite as evidenced by per patient/family report.  Progressing  GOAL:   Patient will meet greater than or equal to 90% of their needs  Progressing  MONITOR:   PO intake, Supplement acceptance, Labs, Weight trends, Skin, I & O's  REASON FOR ASSESSMENT:   Consult Assessment of nutrition requirement/status  ASSESSMENT:   Joseph Copeland is a 75 y.o. male with medical history significant of alcohol abuse, esophagitis, seizures comes in for a month of not eating well, not feeling well found to be dehydrated.  Pt denies abdominal pain, vomiting blood.  No n/v/d.  He recently had a GI work up diagnosed with esophagitis.  Pt drinks etoh daily.  Pt found to be dehydrated with AKI.  Referred for admission for his dehydration.  Spoke with pt at bedside, who reports feeling better and appetite is improving. He estimates consuming 50-75% of breakfast meal. He reports he is consuming Ensure supplements as offered. He is eager to go home and is hopeful his potassium levels will improve.   Discussed importance of good PO intake to promote healing. Most of visit was spent educating pt on ways to increase PO intake. Discussed specific ways that pt could include more protein sources in diet. Also encouraged small, frequent meals in light of poor oral intake. Pt was consuming 1 Boost supplement every other day and was willing to increase frequency (1-2 daily) while at home. Encouraged consumption of Ensure supplements during hospitalization as well.   Labs reviewed: K: 2.9 (on IV supplementation).   Diet Order:   Diet Heart Room service appropriate? Yes; Fluid consistency: Thin  Skin:  Reviewed, no issues  Last BM:  04/19/16  Height:   Ht Readings from Last 1 Encounters:  04/17/16 5\' 2"  (1.575 m)    Weight:   Wt Readings from Last 1 Encounters:  04/19/16 123 lb 14.4 oz (56.2 kg)    Ideal Body Weight:  53.6 kg  BMI:  Body mass index is 22.66 kg/m.  Estimated Nutritional Needs:   Kcal:  1500-1700  Protein:  75-90 grams  Fluid:  1.5-1.7 L  EDUCATION NEEDS:   Education needs addressed  [place]ton Nunley A. Jimmye Norman, RD, LDN, CDE Pager: [phone] After hours Pager: [phone]

## 2016-04-19 NOTE — Progress Notes (Signed)
PROGRESS NOTE    Joseph Copeland  [id number] DOB: 1941-03-14 DOA: 04/16/2016 PCP: Henrine Screws, MD   Brief Narrative:  Joseph Copeland is a 75 y.o. male with medical history significant of alcohol abuse, esophagitis, seizures comes in for a month of not eating well, not feeling well found to be dehydrated.  Pt denies abdominal pain, vomiting blood.  No n/v/d.  He recently had a GI work up diagnosed with esophagitis.  Pt drinks Etoh daily.  Pt found to be dehydrated with AKI.  Referred for admission for his dehydration.   Assessment & Plan:   Principal Problem:   Dehydration Active Problems:   Diabetes mellitus with complication (HCC)   Alcohol dependence ([place])   Seizure disorder (HCC)   Metabolic acidosis   AKI (acute kidney injury) ([place])  AKI 2/2 to Dehydration from poor Appetite and po Intake as well as Alcohol Abuse, improving -Improved with IVF Rehydration -Patient's BUN/Cr went from 10/1.63 -> 7/1.01 -> 7/0.91 -Hold All Nephrotoxic Medications -Nutrition Consultation Appreciated -Repeat BMP in AM   Anion Gap Metabolic Acidosis from Ketoacidosis from Alcoholism, improving -Patient had Ketones in blood on Admission -Improved with IVF Rehydration -Gap was still 12 this AM -Repeat BMP in AM  Generalized Weakness likely from Dehydration and poor po Intake -PT/OT Eval and Treat; No further Recc's -Nutrition Consult for poor po Intake -Counseled on Alcohol Abuse  Alcohol Dependence -Lipase was 331 -LFT's showed AST went from 67 -> 40 -> 36 and ALT went from 50 -> 31 -> 28 -U/S of Abdomen showed There is sludge in the gallbladder. There are hyperechoic foci representing either hyperechoic sludge or non shadowing calculi. Heterogeneous liver compatible with diffuse hepatic steatosis occur diffuse hepatic parenchymal disease.There are cysts in both kidneys as described. These are not significantly changed. Maximal aortic caliber is 2.6 cm. Ectatic abdominal aorta at risk  for aneurysm development. Recommend followup by ultrasound in 5 years.  -Continue CIWA protocol with Lorazepam IV Administration -C/w MVI, Thiamine, and Folate -Counseled on Alcohol Abuse  Hypokalemia -Potassium was 2.7 this AM -Gave Patient IV Kcl 40 mEQ -Repleted with K-Dur 40 mEQ BID x 2 doses -Repeat in afternoon clotted so patient was unable to be D/C'd without new K+ Value  Hypomagnesemia, improved -Magnesium was 1.7 this AM -Replete with IV Mag Sulfate yesterday -Repeat Mag Level in AM  Hypophosphatemia -Phos Level was 1.3 today -Replete with K Phos 500 mg po QID for 2 Days -Nutrition Consultation; C/w Esnure BID -Repeat Phos in AM  Thrombocytopenia -Improved from 73 -> 91 -Likely 2/2 to Alcohol Abuse -Continue to Monitor for S/Sx of Bleeding -Repeat CBC in AM  Diabetes Mellitus without Complication -Diet Controlled -CBG ranging from 111-144  Seizure Disorder -C/w Levetiracetam 500 mg po BID  DVT prophylaxis: SCD's because of Thrombocytopenia Code Status: Full Family Communication: No family at bedside. Disposition Plan: Home at D/C likely in AM  Consultants:   None  Procedures:  Ultrasound of Abdomen  Antimicrobials:  None  Subjective: Patient was seen and examined at bedside and he was doing better. Felt stronger however potassium was still low. Ordered IV replacement and was going to D/C Patient after repeat K+ level however blood clotted and had be re-drawn and is still not back yet. Will D/C in AM if Electrolytes improved. No other complaints or concerns per patient.   Objective: Vitals:   04/18/16 2344 04/19/16 0526 04/19/16 1230 04/19/16 1821  BP: 120/76 121/77 122/79 126/80  Pulse: 88 88 88 86  Resp: 18 18 18 18   Temp: 98.9 F (37.2 C) 98.3 F (36.8 C) 98.3 F (36.8 C) 98.3 F (36.8 C)  TempSrc: Oral Oral Oral Oral  SpO2: 98% 98% 99% 99%  Weight:  56.2 kg (123 lb 14.4 oz)    Height:        Intake/Output Summary (Last 24 hours) at  04/19/16 1953 Last data filed at 04/19/16 1503  Gross per 24 hour  Intake              400 ml  Output                0 ml  Net              400 ml   Filed Weights   04/17/16 0240 04/18/16 0500 04/19/16 0526  Weight: 56.5 kg (124 lb 8 oz) 57.3 kg (126 lb 6.4 oz) 56.2 kg (123 lb 14.4 oz)    Examination: Physical Exam:  Constitutional: Thin and mildly cachectic, NAD and appears calm and comfortable Eyes: Lids and conjunctivae normal, sclerae anicteric; Patient had Scratch under right eye from rubbing his eyes.  ENMT: External Ears, Nose appear normal. Grossly normal hearing. Normal dentition.  Neck: Appears normal, supple, no cervical masses, normal ROM, no appreciable thyromegaly, no JVD Respiratory: Clear to auscultation bilaterally, no wheezing, rales, rhonchi or crackles. Normal respiratory effort and patient is not tachypenic. No accessory muscle use.  Cardiovascular: RRR, no murmurs / rubs / gallops. S1 and S2 auscultated. No extremity edema. 2+ pedal pulses.  Abdomen: Soft, non-tender, non-distended. No masses palpated. No appreciable hepatosplenomegaly. Bowel sounds positive x4.  GU: Deferred. Musculoskeletal: No clubbing / cyanosis of digits/nails. No joint deformity upper and lower extremities. Good ROM, no contractures. Normal strength and muscle tone.  Skin: No rashes, lesions, ulcers. No induration; Warm and dry.  Neurologic: CN 2-12 grossly intact with no focal deficits. Sensation intact in all 4 Extremities. Strength 5/5 in all 4. Romberg sign cerebellar reflexes not assessed.  Psychiatric: Normal judgment and insight. Alert and oriented x 3. Normal mood and appropriate affect.   Data Reviewed: I have personally reviewed following labs and imaging studies  CBC:  Recent Labs Lab 04/16/16 1738 04/17/16 0656 04/18/16 1044 04/19/16 0545  WBC 7.4 6.6 6.5 6.7  NEUTROABS  --   --  5.0 4.6  HGB 15.2 14.1 14.4 14.0  HCT 45.6 42.0 42.6 40.5  MCV 94.6 94.2 93.8 91.0  PLT  95* 76* 73* 91*   Basic Metabolic Panel:  Recent Labs Lab 04/16/16 1738 04/16/16 2311 04/17/16 0656 04/18/16 1044 04/19/16 0545  NA 136 136 137 136 138  K 4.3 4.0 4.1 2.9* 2.7*  CL 101 106 108 106 105  CO2 7* 9* 15* 18* 21*  GLUCOSE 149* 132* 137* 124* 135*  BUN 10 10 9 7 7   CREATININE 1.56* 1.63* 1.25* 1.01 0.91  CALCIUM 9.9 9.3 9.1 9.1 9.1  MG  --   --   --  1.5* 1.7  PHOS  --   --   --  <1.0* 1.3*   GFR: Estimated Creatinine Clearance: 55 mL/min (by C-G formula based on SCr of 0.91 mg/dL). Liver Function Tests:  Recent Labs Lab 04/16/16 1738 04/18/16 1044 04/19/16 0545  AST 67* 40 36  ALT 50 31 28  ALKPHOS 95 81 80  BILITOT 1.7* 1.6* 1.5*  PROT 9.0* 6.9 6.9  ALBUMIN 4.8 3.3* 3.1*    Recent Labs Lab 04/16/16 1738  LIPASE 331*  No results for input(s): AMMONIA in the last 168 hours. Coagulation Profile: No results for input(s): INR, PROTIME in the last 168 hours. Cardiac Enzymes: No results for input(s): CKTOTAL, CKMB, CKMBINDEX, TROPONINI in the last 168 hours. BNP (last 3 results) No results for input(s): PROBNP in the last 8760 hours. HbA1C: No results for input(s): HGBA1C in the last 72 hours. CBG: No results for input(s): GLUCAP in the last 168 hours. Lipid Profile: No results for input(s): CHOL, HDL, LDLCALC, TRIG, CHOLHDL, LDLDIRECT in the last 72 hours. Thyroid Function Tests: No results for input(s): TSH, T4TOTAL, FREET4, T3FREE, THYROIDAB in the last 72 hours. Anemia Panel: No results for input(s): VITAMINB12, FOLATE, FERRITIN, TIBC, IRON, RETICCTPCT in the last 72 hours. Sepsis Labs:  Recent Labs Lab 04/16/16 2134  LATICACIDVEN 1.47    No results found for this or any previous visit (from the past 240 hour(s)).   Radiology Studies: No results found. Scheduled Meds: . feeding supplement (ENSURE ENLIVE)  237 mL Oral BID BM  . folic acid  1 mg Oral Daily  . levETIRAcetam  500 mg Oral BID  . LORazepam  0-4 mg Intravenous Q12H  .  multivitamin with minerals  1 tablet Oral Daily  . phosphorus  500 mg Oral QID  . thiamine  100 mg Oral Daily   Or  . thiamine  100 mg Intravenous Daily   Continuous Infusions:    LOS: 0 days   Kerney Elbe, DO [facility] Pager [phone]  If 7PM-7AM, please contact night-coverage [email or web address] Password [facility] 04/19/2016, 7:53 PM

## 2016-04-20 DIAGNOSIS — E872 Acidosis: Secondary | ICD-10-CM | POA: Diagnosis present

## 2016-04-20 DIAGNOSIS — I10 Essential (primary) hypertension: Secondary | ICD-10-CM | POA: Diagnosis present

## 2016-04-20 DIAGNOSIS — D696 Thrombocytopenia, unspecified: Secondary | ICD-10-CM | POA: Diagnosis present

## 2016-04-20 DIAGNOSIS — K209 Esophagitis, unspecified: Secondary | ICD-10-CM | POA: Diagnosis present

## 2016-04-20 DIAGNOSIS — F10288 Alcohol dependence with other alcohol-induced disorder: Secondary | ICD-10-CM | POA: Diagnosis not present

## 2016-04-20 DIAGNOSIS — N170 Acute kidney failure with tubular necrosis: Secondary | ICD-10-CM | POA: Diagnosis present

## 2016-04-20 DIAGNOSIS — N179 Acute kidney failure, unspecified: Secondary | ICD-10-CM | POA: Diagnosis present

## 2016-04-20 DIAGNOSIS — F10239 Alcohol dependence with withdrawal, unspecified: Secondary | ICD-10-CM | POA: Diagnosis present

## 2016-04-20 DIAGNOSIS — E119 Type 2 diabetes mellitus without complications: Secondary | ICD-10-CM | POA: Diagnosis present

## 2016-04-20 DIAGNOSIS — E876 Hypokalemia: Secondary | ICD-10-CM | POA: Diagnosis not present

## 2016-04-20 DIAGNOSIS — E860 Dehydration: Secondary | ICD-10-CM | POA: Diagnosis present

## 2016-04-20 DIAGNOSIS — H409 Unspecified glaucoma: Secondary | ICD-10-CM | POA: Diagnosis present

## 2016-04-20 DIAGNOSIS — M1990 Unspecified osteoarthritis, unspecified site: Secondary | ICD-10-CM | POA: Diagnosis present

## 2016-04-20 DIAGNOSIS — A084 Viral intestinal infection, unspecified: Secondary | ICD-10-CM | POA: Diagnosis present

## 2016-04-20 DIAGNOSIS — Z87891 Personal history of nicotine dependence: Secondary | ICD-10-CM | POA: Diagnosis not present

## 2016-04-20 DIAGNOSIS — G40909 Epilepsy, unspecified, not intractable, without status epilepticus: Secondary | ICD-10-CM | POA: Diagnosis present

## 2016-04-20 DIAGNOSIS — R197 Diarrhea, unspecified: Secondary | ICD-10-CM

## 2016-04-20 DIAGNOSIS — G4730 Sleep apnea, unspecified: Secondary | ICD-10-CM | POA: Diagnosis present

## 2016-04-20 DIAGNOSIS — Z79899 Other long term (current) drug therapy: Secondary | ICD-10-CM | POA: Diagnosis not present

## 2016-04-20 DIAGNOSIS — R627 Adult failure to thrive: Secondary | ICD-10-CM | POA: Diagnosis present

## 2016-04-20 DIAGNOSIS — K760 Fatty (change of) liver, not elsewhere classified: Secondary | ICD-10-CM | POA: Diagnosis present

## 2016-04-20 LAB — BASIC METABOLIC PANEL
ANION GAP: 9 (ref 5–15)
Anion gap: 8 (ref 5–15)
BUN: 7 mg/dL (ref 6–20)
BUN: 8 mg/dL (ref 6–20)
CALCIUM: 9 mg/dL (ref 8.9–10.3)
CHLORIDE: 108 mmol/L (ref 101–111)
CHLORIDE: 109 mmol/L (ref 101–111)
CO2: 23 mmol/L (ref 22–32)
CO2: 21 mmol/L — AB (ref 22–32)
CREATININE: 0.63 mg/dL (ref 0.61–1.24)
Calcium: 9.1 mg/dL (ref 8.9–10.3)
Creatinine, Ser: 0.71 mg/dL (ref 0.61–1.24)
GFR calc Af Amer: >60 mL/min (ref >60)
GFR calc non Af Amer: >60 mL/min (ref >60)
GFR calc non Af Amer: >60 mL/min (ref >60)
GLUCOSE: 176 mg/dL — AB (ref 65–99)
Glucose, Bld: 137 mg/dL — ABNORMAL HIGH (ref 65–99)
POTASSIUM: 3.8 mmol/L (ref 3.5–5.1)
Potassium: 2.9 mmol/L — ABNORMAL LOW (ref 3.5–5.1)
Sodium: 140 mmol/L (ref 135–145)
Sodium: 138 mmol/L (ref 135–145)

## 2016-04-20 LAB — MAGNESIUM: Magnesium: 1.4 mg/dL — ABNORMAL LOW (ref 1.7–2.4)

## 2016-04-20 LAB — PHOSPHORUS
PHOSPHORUS: 2.4 mg/dL — AB (ref 2.5–4.6)
Phosphorus: 2.3 mg/dL — ABNORMAL LOW (ref 2.5–4.6)

## 2016-04-20 MED ORDER — POTASSIUM CHLORIDE 10 MEQ/100ML IV SOLN
10.0000 meq | INTRAVENOUS | Status: AC
  Administered 2016-04-20 (×4): 10 meq via INTRAVENOUS
  Filled 2016-04-20 (×4): qty 100

## 2016-04-20 MED ORDER — MAGNESIUM SULFATE 2 GM/50ML IV SOLN
2.0000 g | Freq: Once | INTRAVENOUS | Status: AC
  Administered 2016-04-20: 2 g via INTRAVENOUS
  Filled 2016-04-20: qty 50

## 2016-04-20 NOTE — Progress Notes (Signed)
PROGRESS NOTE    Joseph Copeland  [id number] DOB: [birth year] DOA: 04/16/2016 PCP: Henrine Screws, MD   Brief Narrative:  Joseph Copeland is a 75 y.o. male with medical history significant of alcohol abuse, esophagitis, seizures comes in for a month of not eating well, not feeling well found to be dehydrated.  Pt denies abdominal pain, vomiting blood.  No n/v/d.  He recently had a GI work up diagnosed with esophagitis.  Pt drinks Etoh daily.  Pt found to be dehydrated with AKI.  Referred for admission for his dehydration. Patient started doing well and was going to be D/C'd however his potassium dropped because of diarrhea that started a few days ago. Patient was given po Potassium however was unable to maintain it because of his diarrhea so IV potassium was given the last two days for repletion. Will continue to Monitor and repeat Potassium in Am.   Assessment & Plan:   Principal Problem:   Dehydration Active Problems:   Diabetes mellitus with complication (HCC)   Alcohol dependence ([place])   Seizure disorder (HCC)   Metabolic acidosis   AKI (acute kidney injury) ([place])  AKI 2/2 to Dehydration from poor Appetite and po Intake as well as Alcohol Abuse, improving -Improved with IVF Rehydration; Encouraged po Intake -Patient's BUN/Cr went from 10/1.63 -> 7/1.01 -> 7/0.91 -> 8/0.63 -Hold All Nephrotoxic Medications -Nutrition Consultation Appreciated -Repeat BMP in AM   Anion Gap Metabolic Acidosis from Ketoacidosis from Alcoholism, improving -Patient had Ketones in blood on Admission -Improved with IVF Rehydration -Gap was 9 this Am; Repeat Gap was 8 -Repeat BMP in AM  Generalized Weakness likely from Dehydration and poor po Intake -PT/OT Eval and Treat; No further Recc's -Nutrition Consult for poor po Intake -Counseled on Alcohol Abuse  Alcohol Dependence -Lipase was 331 on admission -LFT's showed AST went from 67 -> 40 -> 36 and ALT went from 50 -> 31 -> 28 -U/S of Abdomen  showed There is sludge in the gallbladder. There are hyperechoic foci representing either hyperechoic sludge or non shadowing calculi. Heterogeneous liver compatible with diffuse hepatic steatosis occur diffuse hepatic parenchymal disease.There are cysts in both kidneys as described. These are not significantly changed. Maximal aortic caliber is 2.6 cm. Ectatic abdominal aorta at risk for aneurysm development. Recommend followup by ultrasound in 5 years.  -Continue CIWA protocol with Lorazepam IV Administration -C/w MVI, Thiamine, and Folate -Counseled on Alcohol Abuse  Diarrhea -Likely not infectious as patient's WBC was normal and patient was Afebrile -? EtOH withdrawal -Continue to Monitor however patient states diarrhea is improving and is becoming more solid  Hypokalemia 2/2 to Diarrhea -Potassium was 2.9 this AM -Gave Patient IV Kcl 40 mEQ -Repleted with K-Dur 40 mEQ BID x 2 doses -Repeat potassium this Afternoon however it was drawn prior to the last Potassium bag being given  Hypomagnesemia 2/2 to Diarrhea -Magnesium was 1.4 this AM -Replete with IV Mag Sulfate -Repeat Mag Level in AM  Hypophosphatemia -Phos Level was 2.4 today -Replete with K Phos 500 mg po QID for 2 Days -Nutrition Consultation; C/w Esnure BID -Repeat Phos in AM  Thrombocytopenia -Improved from 73 -> 91 -Likely 2/2 to Alcohol Abuse -Continue to Monitor for S/Sx of Bleeding  Diabetes Mellitus without Complication -Diet Controlled -CBG ranging from 122-176  Seizure Disorder -C/w Levetiracetam 500 mg po BID  DVT prophylaxis: SCD's because of Thrombocytopenia Code Status: Full Family Communication: No family at bedside. Disposition Plan: Home at D/C likely in AM if Electrolytes stable  and Diarrhea improved  Consultants:   None  Procedures:  Ultrasound of Abdomen  Antimicrobials:  None  Subjective: Patient was seen and examined at bedside and he was doing better but admitted that he was  having diarrhea but stated it was getting better. Denied any abdominal Pain or Chest Pain. No N/V. States he is eating better and last drink was last Sunday.   Objective: Vitals:   04/19/16 1821 04/19/16 2339 04/20/16 0533 04/20/16 1336  BP: 126/80 124/77 123/75 134/76  Pulse: 86 83 78 78  Resp: 18 18 18 17  Temp: 98.3 F (36.8 C) 98.3 F (36.8 C) 98.2 F (36.8 C) 98.3 F (36.8 C)  TempSrc: Oral Oral Oral Oral  SpO2: 99% 97% 98% 100%  Weight:   56.3 kg (124 lb 3.2 oz)   Height:        Intake/Output Summary (Last 24 hours) at 04/20/16 1752 Last data filed at 04/20/16 1337  Gross per 24 hour  Intake              600 ml  Output                0 ml  Net              60 0 ml   Filed Weights   04/18/16 0500 04/19/16 0526 04/20/16 0533  Weight: 57.3 kg (126 lb 6.4 oz) 56.2 kg (123 lb 14.4 oz) 56.3 kg (124 lb 3.2 oz)    Examination: Physical Exam:  Constitutional: Thin and mildly cachectic, NAD and appears calm and comfortable Eyes: Lids and conjunctivae normal, sclerae anicteric; Patient had Scratch under right eye from rubbing his eyes.  ENMT: External Ears, Nose appear normal. Grossly normal hearing. Normal dentition.  Neck: Appears normal, supple, no cervical masses, normal ROM, no appreciable thyromegaly, no JVD Respiratory: Clear to auscultation bilaterally, no wheezing, rales, rhonchi or crackles. Normal respiratory effort and patient is not tachypenic. No accessory muscle use.  Cardiovascular: RRR, no murmurs / rubs / gallops. S1 and S2 auscultated. No extremity edema. 2+ pedal pulses.  Abdomen: Soft, non-tender, non-distended. No masses palpated. No appreciable hepatosplenomegaly. Bowel sounds positive x4.  GU: Deferred. Musculoskeletal: No clubbing / cyanosis of digits/nails. No joint deformity upper and lower extremities. Good ROM, no contractures. Normal strength and muscle tone.  Skin: No rashes, lesions, ulcers. No induration; Warm and dry.  Neurologic: CN 2-12  grossly intact with no focal deficits. Sensation intact in all 4 Extremities. Strength 5/5 in all 4. Romberg sign cerebellar reflexes not assessed.  Psychiatric: Normal judgment and insight. Alert and oriented x 3. Normal mood and appropriate affect.   Data Reviewed: I have personally reviewed following labs and imaging studies  CBC:  Recent Labs Lab 04/16/16 1738 04/17/16 0656 04/18/16 1044 04/19/16 0545  WBC 7.4 6.6 6.5 6.7  NEUTROABS  --   --  5.0 4.6  HGB 15.2 14.1 14.4 14.0  HCT 45.6 42.0 42.6 40.5  MCV 94.6 94.2 93.8 91.0  PLT 95* 76* 73* 91*   Basic Metabolic Panel:  Recent Labs Lab 04/18/16 1044 04/19/16 0545 04/19/16 1916 04/20/16 0609 04/20/16 1422  NA 136 138 138 140 138  K 2.9* 2.7* 3.3* 2.9* 3.8  CL 106 105 105 108 109  CO2 18* 21* 23 23 21*  GLUCOSE 124* 135* 122* 137* 176*  BUN 7 7 10 7 8   CREATININE 1.01 0.91 0.76 0.71 0.63  CALCIUM 9.1 9.1 9.5 9.0 9.1  MG 1.5* 1.7  --  1.4*  --   PHOS <1.0* 1.3*  --  2.4*  --    GFR: Estimated Creatinine Clearance: 62.6 mL/min (by C-G formula based on SCr of 0.63 mg/dL). Liver Function Tests:  Recent Labs Lab 04/16/16 1738 04/18/16 1044 04/19/16 0545  AST 67* 40 36  ALT 50 31 28  ALKPHOS 95 81 80  BILITOT 1.7* 1.6* 1.5*  PROT 9.0* 6.9 6.9  ALBUMIN 4.8 3.3* 3.1*    Recent Labs Lab 04/16/16 1738  LIPASE 331*   No results for input(s): AMMONIA in the last 168 hours. Coagulation Profile: No results for input(s): INR, PROTIME in the last 168 hours. Cardiac Enzymes: No results for input(s): CKTOTAL, CKMB, CKMBINDEX, TROPONINI in the last 168 hours. BNP (last 3 results) No results for input(s): PROBNP in the last 8760 hours. HbA1C: No results for input(s): HGBA1C in the last 72 hours. CBG: No results for input(s): GLUCAP in the last 168 hours. Lipid Profile: No results for input(s): CHOL, HDL, LDLCALC, TRIG, CHOLHDL, LDLDIRECT in the last 72 hours. Thyroid Function Tests: No results for input(s):  TSH, T4TOTAL, FREET4, T3FREE, THYROIDAB in the last 72 hours. Anemia Panel: No results for input(s): VITAMINB12, FOLATE, FERRITIN, TIBC, IRON, RETICCTPCT in the last 72 hours. Sepsis Labs:  Recent Labs Lab 04/16/16 2134  LATICACIDVEN 1.47    No results found for this or any previous visit (from the past 240 hour(s)).   Radiology Studies: No results found. Scheduled Meds: . feeding supplement (ENSURE ENLIVE)  237 mL Oral BID BM  . folic acid  1 mg Oral Daily  . levETIRAcetam  500 mg Oral BID  . LORazepam  0-4 mg Intravenous Q12H  . multivitamin with minerals  1 tablet Oral Daily  . thiamine  100 mg Oral Daily   Continuous Infusions:    LOS: 0 days   Kerney Elbe, DO [facility] Pager [phone]  If 7PM-7AM, please contact night-coverage [email or web address] Password [email or web address] 04/20/2016, 5:52 PM

## 2016-04-21 DIAGNOSIS — R197 Diarrhea, unspecified: Secondary | ICD-10-CM

## 2016-04-21 LAB — CBC WITH DIFFERENTIAL/PLATELET
BASOS ABS: 0.1 10*3/uL (ref 0.0–0.1)
Basophils Relative: 1 %
Eosinophils Absolute: 0.1 10*3/uL (ref 0.0–0.7)
Eosinophils Relative: 2 %
HCT: 39 % (ref 39.0–52.0)
HEMOGLOBIN: 13.4 g/dL (ref 13.0–17.0)
LYMPHS PCT: 21 %
Lymphs Abs: 1.1 10*3/uL (ref 0.7–4.0)
MCH: 31.5 pg (ref 26.0–34.0)
MCHC: 34.4 g/dL (ref 30.0–36.0)
MCV: 91.5 fL (ref 78.0–100.0)
MONOS PCT: 17 %
Monocytes Absolute: 0.9 10*3/uL (ref 0.1–1.0)
NEUTROS ABS: 3 10*3/uL (ref 1.7–7.7)
Neutrophils Relative %: 59 %
Platelets: 148 10*3/uL — ABNORMAL LOW (ref 150–400)
RBC: 4.26 MIL/uL (ref 4.22–5.81)
RDW: 16.9 % — ABNORMAL HIGH (ref 11.5–15.5)
WBC: 5.2 10*3/uL (ref 4.0–10.5)

## 2016-04-21 LAB — COMPREHENSIVE METABOLIC PANEL
ALT: 41 U/L (ref 17–63)
AST: 66 U/L — AB (ref 15–41)
Albumin: 3.1 g/dL — ABNORMAL LOW (ref 3.5–5.0)
Alkaline Phosphatase: 70 U/L (ref 38–126)
Anion gap: 9 (ref 5–15)
BUN: 5 mg/dL — AB (ref 6–20)
CHLORIDE: 106 mmol/L (ref 101–111)
CO2: 23 mmol/L (ref 22–32)
CREATININE: 0.67 mg/dL (ref 0.61–1.24)
Calcium: 8.9 mg/dL (ref 8.9–10.3)
GFR calc Af Amer: >60 mL/min (ref >60)
GFR calc non Af Amer: >60 mL/min (ref >60)
GLUCOSE: 146 mg/dL — AB (ref 65–99)
Potassium: 2.8 mmol/L — ABNORMAL LOW (ref 3.5–5.1)
SODIUM: 138 mmol/L (ref 135–145)
Total Bilirubin: 0.7 mg/dL (ref 0.3–1.2)
Total Protein: 6.4 g/dL — ABNORMAL LOW (ref 6.5–8.1)

## 2016-04-21 LAB — PHOSPHORUS: Phosphorus: 3 mg/dL (ref 2.5–4.6)

## 2016-04-21 LAB — MAGNESIUM: MAGNESIUM: 1.4 mg/dL — AB (ref 1.7–2.4)

## 2016-04-21 MED ORDER — MAGNESIUM SULFATE 2 GM/50ML IV SOLN
2.0000 g | Freq: Once | INTRAVENOUS | Status: AC
  Administered 2016-04-21: 2 g via INTRAVENOUS
  Filled 2016-04-21: qty 50

## 2016-04-21 MED ORDER — POTASSIUM CHLORIDE 10 MEQ/100ML IV SOLN
10.0000 meq | INTRAVENOUS | Status: AC
  Administered 2016-04-21 (×4): 10 meq via INTRAVENOUS
  Filled 2016-04-21 (×4): qty 100

## 2016-04-21 MED ORDER — POTASSIUM CHLORIDE 20 MEQ PO PACK
40.0000 meq | PACK | Freq: Two times a day (BID) | ORAL | Status: DC
  Administered 2016-04-21: 40 meq via ORAL
  Filled 2016-04-21 (×2): qty 2

## 2016-04-21 MED ORDER — POTASSIUM PHOSPHATE MONOBASIC 500 MG PO TABS
500.0000 mg | ORAL_TABLET | Freq: Three times a day (TID) | ORAL | Status: DC
  Administered 2016-04-21 (×2): 500 mg via ORAL
  Filled 2016-04-21 (×5): qty 1

## 2016-04-21 MED ORDER — POTASSIUM CHLORIDE 20 MEQ PO PACK
40.0000 meq | PACK | Freq: Two times a day (BID) | ORAL | Status: DC
  Filled 2016-04-21: qty 2

## 2016-04-21 MED ORDER — POTASSIUM CHLORIDE 10 MEQ/100ML IV SOLN: 10.0000 meq | INTRAVENOUS | Status: DC

## 2016-04-21 MED ORDER — K PHOS MONO-SOD PHOS DI & MONO 155-852-130 MG PO TABS
500.0000 mg | ORAL_TABLET | Freq: Three times a day (TID) | ORAL | Status: DC
  Administered 2016-04-21 – 2016-04-22 (×3): 500 mg via ORAL
  Filled 2016-04-21 (×6): qty 2

## 2016-04-21 NOTE — Progress Notes (Signed)
PROGRESS NOTE    Joseph Copeland  [id number] DOB: 06-04-1941 DOA: 04/16/2016 PCP: Henrine Screws, MD   Brief Narrative:  Joseph Copeland is a 75 y.o. male with medical history significant of alcohol abuse, esophagitis, seizures comes in for a month of not eating well, not feeling well found to be dehydrated.  Pt denies abdominal pain, vomiting blood.  No n/v/d.  He recently had a GI work up diagnosed with esophagitis.  Pt drinks Etoh daily.  Pt found to be dehydrated with AKI.  Referred for admission for his dehydration. Patient started doing well and was going to be D/C'd however his potassium dropped because of diarrhea that started a few days ago. Patient was given po Potassium however was unable to maintain it because of his diarrhea so IV potassium was given the last two days for repletion. Patient's Potassium, Magnesium, and Phosphorous was still low today and is currently being aggressively repleted.   Assessment & Plan:   Principal Problem:   Dehydration Active Problems:   Diabetes mellitus with complication (HCC)   Alcohol dependence ([place])   Seizure disorder (HCC)   Hypokalemia   Metabolic acidosis   AKI (acute kidney injury) ([place])   Hypomagnesemia   Diarrhea  AKI 2/2 to Dehydration from poor Appetite and po Intake as well as Alcohol Abuse and likely viral Diarrhea, improving -Improved with IVF Rehydration; Encouraged po Intake -Patient's BUN/Cr went from 10/1.63 -> 7/1.01 -> 7/0.91 -> 8/0.63 -> 5/0.67 -Hold All Nephrotoxic Medications -Nutrition Consultation Appreciated -Repeat BMP in AM   Anion Gap Metabolic Acidosis from Ketoacidosis from Alcoholism, improving -Patient had Ketones in blood on Admission -Improved with IVF Rehydration -Gap was 9 this Am; -Repeat BMP in AM  Diarrhea -Per patient it has resolved -Likely not infectious as patient's WBC was normal and patient was Afebrile; Patient had Atypical Lymphocytes on Differential Likely Viral Diarrhea;  Discussed with Dr. Julien Nordmann in  -? EtOH withdrawal or Alcohol related -Continue to Monitor and placed a Hat in toilet for stool sample collection  Generalized Weakness likely from Dehydration and poor po Intake, improved -PT/OT Eval and Treat; No further Recc's -Nutrition Consult for poor po Intake -Counseled on Alcohol Abuse -Possible Component from Diarrhea  Alcohol Dependence -Lipase was 331 on admission -AST went from 67 -> 40 -> 36 -> 66  -ALT went from 50 -> 31 -> 28 -> 41 -U/S of Abdomen showed There is sludge in the gallbladder. There are hyperechoic foci representing either hyperechoic sludge or non shadowing calculi. Heterogeneous liver compatible with diffuse hepatic steatosis occur diffuse hepatic parenchymal disease.There are cysts in both kidneys as described. These are not significantly changed. Maximal aortic caliber is 2.6 cm. Ectatic abdominal aorta at risk for aneurysm development. Recommend followup by ultrasound in 5 years.  -Continue CIWA protocol with Lorazepam IV Administration -C/w MVI, Thiamine, and Folate -Counseled on Alcohol Abuse  Hypokalemia 2/2 to Diarrhea -Potassium was 2.8 this AM -Gave Patient IV Kcl 40 mEQ -Repleted with K-Dur 40 mEQ BID -Repleted with K Phos 500 mg tablets TIDwm and at Bedtime -Phone Consultation placed to Dr. Joelyn Oms and he states that since diarrhea has resolved electrolyte abnormalities likely it is from Alcohol or prolonged ATN. Unlikely Fanconi Syndrome. Stated to aggressively replete and repeat in AM  Hypomagnesemia 2/2 to Diarrhea -Magnesium was 1.4 this AM -Replete with IV Mag Sulfate -Repeat Mag Level in AM -Phone Consultation placed to Dr. Joelyn Oms and he states that since diarrhea has resolved electrolyte abnormalities likely it is from  Alcohol or prolonged ATN. Unlikely Fanconi Syndrome. Stated to aggressively replete and repeat in AM  Hypophosphatemia -Phos Level was 2.3 today -Replete with K Phos 500 mg po TIDwm and  Bedtime -Nutrition Consultation; C/w Esnure BID -Repeat Phos in AM -Phone Consultation placed to Dr. Joelyn Oms and he states that since diarrhea has resolved electrolyte abnormalities likely it is from Alcohol or prolonged ATN. Unlikely Fanconi Syndrome. Stated to aggressively replete and repeat in AM  Thrombocytopenia, improving -Improved from 73 -> 91 -> 148 -Likely 2/2 to Alcohol Abuse -Continue to Monitor for S/Sx of Bleeding  Diabetes Mellitus without Complication -Diet Controlled -CBG ranging from 122-176  Seizure Disorder -C/w Levetiracetam 500 mg po BID  DVT prophylaxis: SCD's because of Thrombocytopenia Code Status: Full Family Communication: No family at bedside. Disposition Plan: Home at D/C likely in AM if Electrolytes stable and Diarrhea improved  Consultants:   -Phone Consultation with Dr. Pearson Grippe in Nephrology  -Phone Consultation with Dr. Julien Nordmann in Hematology/Oncology.   Procedures:  Ultrasound of Abdomen  Antimicrobials:  None  Subjective: Patient was seen and examined at bedside and he was disheartened that his potassium was still low. Stated that his diarrhea had resolved and he did not have a bowl movement since yesterday morning. Denied any Cp, SOB, N/V or Abdominal Pain. No other concerns or complaints at this time.    Objective: Vitals:   04/19/16 2339 04/20/16 0533 04/20/16 1336 04/20/16 2058  BP: 124/77 123/75 134/76 123/79  Pulse: 83 78 78 77  Resp: 18 18 17 18   Temp: 98.3 F (36.8 C) 98.2 F (36.8 C) 98.3 F (36.8 C) 98.4 F (36.9 C)  TempSrc: Oral Oral Oral Oral  SpO2: 97% 98% 100% 97%  Weight:  56.3 kg (124 lb 3.2 oz)  57.7 kg (127 lb 3.3 oz)  Height:        Intake/Output Summary (Last 24 hours) at 04/21/16 1321 Last data filed at 04/21/16 1223  Gross per 24 hour  Intake              810 ml  Output              300 ml  Net              510 ml   Filed Weights   04/19/16 0526 04/20/16 0533 04/20/16 2058  Weight: 56.2 kg (123  lb 14.4 oz) 56.3 kg (124 lb 3.2 oz) 57.7 kg (127 lb 3.3 oz)    Examination: Physical Exam:  Constitutional: Thin and mildly cachectic, NAD and appears calm and comfortable Eyes: Lids normal and conjunctivae slightly red, sclerae anicteric; Patient had Scratch under right eye from rubbing his eyes.  ENMT: External Ears, Nose appear normal. Grossly normal hearing.  Neck: Appears normal, supple, no cervical masses, normal ROM, no appreciable thyromegaly, no JVD Respiratory: Clear to auscultation bilaterally, no wheezing, rales, rhonchi or crackles. Normal respiratory effort and patient is not tachypenic. No accessory muscle use.  Cardiovascular: RRR, no murmurs / rubs / gallops. S1 and S2 auscultated. No extremity edema. 2+ pedal pulses.  Abdomen: Soft, non-tender, non-distended. No masses palpated. No appreciable hepatosplenomegaly. Bowel sounds positive x4.  GU: Deferred. Musculoskeletal: No clubbing / cyanosis of digits/nails. No joint deformity upper and lower extremities. Good ROM, no contractures. Normal strength and muscle tone.  Skin: No rashes, lesions, ulcers. No induration; Warm and dry.  Neurologic: CN 2-12 grossly intact with no focal deficits. Sensation intact in all 4 Extremities. Strength 5/5 in all 4.  Romberg sign cerebellar reflexes not assessed.  Psychiatric: Normal judgment and insight. Alert and oriented x 3. Normal and plesant mood and appropriate affect.   Data Reviewed: I have personally reviewed following labs and imaging studies  CBC:  Recent Labs Lab 04/16/16 1738 04/17/16 0656 04/18/16 1044 04/19/16 0545 04/21/16 0641  WBC 7.4 6.6 6.5 6.7 5.2  NEUTROABS  --   --  5.0 4.6 3.0  HGB 15.2 14.1 14.4 14.0 13.4  HCT 45.6 42.0 42.6 40.5 39.0  MCV 94.6 94.2 93.8 91.0 91.5  PLT 95* 76* 73* 91* [id number]*   Basic Metabolic Panel:  Recent Labs Lab 04/18/16 1044 04/19/16 0545 04/19/16 1916 04/20/16 0609 04/20/16 1422 04/20/16 2005 04/21/16 0641  NA 136 138 138  140 138  --  138  K 2.9* 2.7* 3.3* 2.9* 3.8  --  2.8*  CL 106 105 105 108 109  --  106  CO2 18* 21* 23 23 21*  --  23  GLUCOSE 124* 135* 122* 137* 176*  --  146*  BUN 7 7 10 7 8   --  5*  CREATININE 1.01 0.91 0.76 0.71 0.63  --  0.67  CALCIUM 9.1 9.1 9.5 9.0 9.1  --  8.9  MG 1.5* 1.7  --  1.4*  --   --  1.4*  PHOS <1.0* 1.3*  --  2.4*  --  2.3*  --    GFR: Estimated Creatinine Clearance: 62.6 mL/min (by C-G formula based on SCr of 0.67 mg/dL). Liver Function Tests:  Recent Labs Lab 04/16/16 1738 04/18/16 1044 04/19/16 0545 04/21/16 0641  AST 67* 40 36 66*  ALT 50 31 28 41  ALKPHOS 95 81 80 70  BILITOT 1.7* 1.6* 1.5* 0.7  PROT 9.0* 6.9 6.9 6.4*  ALBUMIN 4.8 3.3* 3.1* 3.1*    Recent Labs Lab 04/16/16 1738  LIPASE 331*   No results for input(s): AMMONIA in the last 168 hours. Coagulation Profile: No results for input(s): INR, PROTIME in the last 168 hours. Cardiac Enzymes: No results for input(s): CKTOTAL, CKMB, CKMBINDEX, TROPONINI in the last 168 hours. BNP (last 3 results) No results for input(s): PROBNP in the last 8760 hours. HbA1C: No results for input(s): HGBA1C in the last 72 hours. CBG: No results for input(s): GLUCAP in the last 168 hours. Lipid Profile: No results for input(s): CHOL, HDL, LDLCALC, TRIG, CHOLHDL, LDLDIRECT in the last 72 hours. Thyroid Function Tests: No results for input(s): TSH, T4TOTAL, FREET4, T3FREE, THYROIDAB in the last 72 hours. Anemia Panel: No results for input(s): VITAMINB12, FOLATE, FERRITIN, TIBC, IRON, RETICCTPCT in the last 72 hours. Sepsis Labs:  Recent Labs Lab 04/16/16 2134  LATICACIDVEN 1.47    No results found for this or any previous visit (from the past 240 hour(s)).   Radiology Studies: No results found. Scheduled Meds: . feeding supplement (ENSURE ENLIVE)  237 mL Oral BID BM  . folic acid  1 mg Oral Daily  . levETIRAcetam  500 mg Oral BID  . multivitamin with minerals  1 tablet Oral Daily  . potassium  chloride  40 mEq Oral BID  . potassium chloride  10 mEq Intravenous Q1 Hr x 4  . potassium phosphate (monobasic)  500 mg Oral TID WC & HS  . thiamine  100 mg Oral Daily   Continuous Infusions:    LOS: 1 day   Kerney Elbe, DO [facility] Pager [phone]  If 7PM-7AM, please contact night-coverage [email or web address] Password [email or web address] 04/21/2016, 1:21 PM

## 2016-04-22 DIAGNOSIS — I10 Essential (primary) hypertension: Secondary | ICD-10-CM

## 2016-04-22 LAB — COMPREHENSIVE METABOLIC PANEL
ALBUMIN: 2.9 g/dL — AB (ref 3.5–5.0)
ALK PHOS: 67 U/L (ref 38–126)
ALT: 52 U/L (ref 17–63)
ANION GAP: 7 (ref 5–15)
AST: 64 U/L — ABNORMAL HIGH (ref 15–41)
BUN: 7 mg/dL (ref 6–20)
CALCIUM: 8.7 mg/dL — AB (ref 8.9–10.3)
CO2: 29 mmol/L (ref 22–32)
CREATININE: 0.76 mg/dL (ref 0.61–1.24)
Chloride: 103 mmol/L (ref 101–111)
GFR calc Af Amer: >60 mL/min (ref >60)
GFR calc non Af Amer: >60 mL/min (ref >60)
GLUCOSE: 161 mg/dL — AB (ref 65–99)
Potassium: 3.4 mmol/L — ABNORMAL LOW (ref 3.5–5.1)
SODIUM: 139 mmol/L (ref 135–145)
Total Bilirubin: 0.7 mg/dL (ref 0.3–1.2)
Total Protein: 6.4 g/dL — ABNORMAL LOW (ref 6.5–8.1)

## 2016-04-22 LAB — CBC WITH DIFFERENTIAL/PLATELET
BASOS ABS: 0 10*3/uL (ref 0.0–0.1)
BASOS PCT: 1 %
EOS ABS: 0.1 10*3/uL (ref 0.0–0.7)
Eosinophils Relative: 2 %
HCT: 37.4 % — ABNORMAL LOW (ref 39.0–52.0)
HEMOGLOBIN: 12.6 g/dL — AB (ref 13.0–17.0)
Lymphocytes Relative: 20 %
Lymphs Abs: 1.1 10*3/uL (ref 0.7–4.0)
MCH: 31.4 pg (ref 26.0–34.0)
MCHC: 33.7 g/dL (ref 30.0–36.0)
MCV: 93.3 fL (ref 78.0–100.0)
Monocytes Absolute: 1 10*3/uL (ref 0.1–1.0)
Monocytes Relative: 18 %
NEUTROS PCT: 59 %
Neutro Abs: 3.3 10*3/uL (ref 1.7–7.7)
Platelets: 159 10*3/uL (ref 150–400)
RBC: 4.01 MIL/uL — AB (ref 4.22–5.81)
RDW: 17.1 % — ABNORMAL HIGH (ref 11.5–15.5)
WBC: 5.6 10*3/uL (ref 4.0–10.5)

## 2016-04-22 LAB — MAGNESIUM: Magnesium: 1.7 mg/dL (ref 1.7–2.4)

## 2016-04-22 MED ORDER — POTASSIUM CHLORIDE 10 MEQ/100ML IV SOLN
10.0000 meq | INTRAVENOUS | Status: AC
  Administered 2016-04-22 (×3): 10 meq via INTRAVENOUS
  Filled 2016-04-22 (×2): qty 100

## 2016-04-22 MED ORDER — ACETAMINOPHEN 325 MG PO TABS
650.0000 mg | ORAL_TABLET | Freq: Four times a day (QID) | ORAL | Status: DC | PRN
  Administered 2016-04-22: 650 mg via ORAL
  Filled 2016-04-22: qty 2

## 2016-04-22 MED ORDER — ALUM & MAG HYDROXIDE-SIMETH 200-200-20 MG/5ML PO SUSP
30.0000 mL | Freq: Four times a day (QID) | ORAL | Status: DC | PRN
  Administered 2016-04-22: 30 mL via ORAL
  Filled 2016-04-22: qty 30

## 2016-04-22 MED ORDER — POTASSIUM CHLORIDE CRYS ER 20 MEQ PO TBCR
40.0000 meq | EXTENDED_RELEASE_TABLET | Freq: Two times a day (BID) | ORAL | Status: DC
  Administered 2016-04-22: 40 meq via ORAL
  Filled 2016-04-22: qty 2

## 2016-04-22 NOTE — Progress Notes (Signed)
Physical Therapy Evaluation/ Late Entry Addendum  Patient Details Name: Joseph Copeland MRN: [id number] DOB: [birth year] Today's Date: 04/22/2016   History of Present Illness  Patient presented to the ED with dehuydration. He reported feeling weak and dizzy. He reports feeling much better today. Pertinant PMH DMII, arthiritis, seizures and HTN   Clinical Impression  Patient appears to be at baseline mobility. He had no loss of balance while walking hosuehold distances. He reported no fatigue or shortness of breath. D/C to HEP.     Follow Up Recommendations      Equipment Recommendations       Recommendations for Other Services       Precautions / Restrictions Precautions Precautions: Fall Restrictions Weight Bearing Restrictions: No      Mobility  Bed Mobility Overal bed mobility: Independent             General bed mobility comments: No difficultys with bed mobility   Transfers Overall transfer level: Independent               General transfer comment: Sit to stand transfers with initial report of syncope but no LOB. Syncope cleared in about 5 seconds and patient was able to transfer independently. Patient advised to count to 5 at home.   Ambulation/Gait             General Gait Details: Patient able to ambualte with turns and whith turning head with no syncope or loss of balance   Stairs            Wheelchair Mobility    Modified Rankin (Stroke Patients Only)       Balance                                             Pertinent Vitals/Pain      Home Living Family/patient expects to be discharged to:: Private residence Living Arrangements: Spouse/significant other                    Prior Function Level of Independence: Independent               Hand Dominance        Extremity/Trunk Assessment                         Communication   Communication: No difficulties  Cognition  Arousal/Alertness: Awake/alert Behavior During Therapy: WFL for tasks assessed/performed Overall Cognitive Status: Within Functional Limits for tasks assessed                      General Comments      Exercises     Assessment/Plan    PT Assessment Patent does not need any further PT services  PT Problem List            PT Treatment Interventions      PT Goals (Current goals can be found in the Care Plan section)  Acute Rehab PT Goals Patient Stated Goal: to go home  PT Goal Formulation: With patient Time For Goal Achievement: 04/25/16 Potential to Achieve Goals: Good    Frequency     Barriers to discharge        Co-evaluation               End of Session Equipment Utilized During Treatment: Gait belt  Functional Assessment Tool Used: clinical decision making  Functional Limitation: Mobility: Walking and moving around Mobility: Walking and Moving Around Current Status [phone]): At least 1 percent but less than 20 percent impaired, limited or restricted Mobility: Walking and Moving Around Goal Status [phone]): At least 1 percent but less than 20 percent impaired, limited or restricted Mobility: Walking and Moving Around Discharge Status [phone]): At least 1 percent but less than 20 percent impaired, limited or restricted    Time:  -      Charges:         PT G Codes:   PT G-Codes **NOT FOR INPATIENT CLASS** Functional Assessment Tool Used: clinical decision making  Functional Limitation: Mobility: Walking and moving around Mobility: Walking and Moving Around Current Status [phone]): At least 1 percent but less than 20 percent impaired, limited or restricted Mobility: Walking and Moving Around Goal Status [phone]): At least 1 percent but less than 20 percent impaired, limited or restricted Mobility: Walking and Moving Around Discharge Status [phone]): At least 1 percent but less than 20 percent impaired, limited or restricted    [name] PT DPT  04/22/2016, 1:05 PM

## 2016-04-22 NOTE — Care Management Note (Addendum)
Case Management Note  Patient Details  Name: Joseph Copeland MRN: [id number] Date of Birth: [birth year]  Subjective/Objective:       Present with dehydration, AKI,  medical history significant of alcohol abuse, esophagitis, seizures comes. From home with wife.          PCP: Josetta Huddle  Action/Plan: Plan is to d/c to home today if diarrhea has improved. CM to f/u with disposition needs.   Expected Discharge Date:  04/23/16               Expected Discharge Plan:  Home/Self Care  In-House Referral:     Discharge planning Services  CM Consult  Post Acute Care Choice:    Choice offered to:     DME Arranged:    DME Agency:     HH Arranged:    HH Agency:     Status of Service:  In process, will continue to follow  If discussed at Long Length of Stay Meetings, dates discussed:    Additional Comments:  Sharin Mons, RN 04/22/2016, 10:47 AM

## 2016-04-22 NOTE — Progress Notes (Signed)
Pt given discharge instructions, prescriptions, and care notes. Pt verbalized understanding AEB no further questions or concerns at this time. IV was discontinued, no redness, pain, or swelling noted at this time. Pt left the floor via wheelchair with staff in stable condition. 

## 2016-04-22 NOTE — Discharge Summary (Signed)
Physician Discharge Summary  Joseph Copeland [id number] DOB: 1940/08/05 DOA: 04/16/2016  PCP: Henrine Screws, MD  Admit date: 04/16/2016 Discharge date: 04/22/2016  Admitted From: Home Disposition:  Home  Recommendations for Outpatient Follow-up:  1. Follow up with PCP Dr. Inda Merlin in 1-2 weeks 2. Follow up on Ectatic abdominal aorta at risk for aneurysm development with Ultrasound in 5 years 3. Please obtain CMP/CBC in one week  Home Health: No Equipment/Devices:  None  Discharge Condition: Stable and Improved CODE STATUS: FULL Diet recommendation: Heart Healthy / Carb Modified  Brief/Interim Summary: Joseph Murphyis a 75 y.o.malewith medical history significant of alcohol abuse, esophagitis, seizures comes in for a month of not eating well, not feeling well found to be dehydrated. Pt denies abdominal pain, vomiting blood. No n/v/d. He recently had a GI work up diagnosed with esophagitis. Pt drinks Etoh daily and was found to be dehydrated with AKI. Referred for admission for his dehydration. Patient started doing well and was going to be D/C'd however his potassium dropped because of diarrhea that started a few days ago. Patient was given po Potassium however was unable to maintain it because of his diarrhea so IV potassium was given the last three days for repletion. Patient's Potassium, Magnesium, and Phosphorous was still low yesterday and is currently being aggressively repleted. Patient's Potassium was still slightly low so was repleted today and patient was D/C'd Home to follow up with his PCP Dr. Inda Merlin and repeat Potassium as an outpatient. He was stable for Discharge and his diarrhea had ceased. He was counseled on drinking EtOH and follow up plan was discussed.   Discharge Diagnoses:  Principal Problem:   Dehydration Active Problems:   Diabetes mellitus with complication (HCC)   Alcohol dependence (HCC)   Seizure disorder (HCC)   Hypokalemia   Metabolic  acidosis   AKI (acute kidney injury) ([place])   Hypomagnesemia   Diarrhea  AKI 2/2 to Dehydration from poor Appetite and po Intake as well as Alcohol Abuse and likely viral Diarrhea, improving -Improved with IVF Rehydration; Encouraged po Intake -Patient's BUN/Cr went from 10/1.63 -> 7/1.01 -> 7/0.91 -> 8/0.63 -> 5/0.67 -> 7/0.76 -Hold All Nephrotoxic Medications -Nutrition Consultation Appreciated -Repeat BMP at PCP's Dr. Inda Merlin Office   Anion Gap Metabolic Acidosis from Ketoacidosis from Alcoholism, improving -Patient had Ketones in blood on Admission -Improved with IVF Rehydration -Gap was7 this Am; -Repeat CMP at PCP's Dr. Inda Merlin Office  Diarrhea, Resolved -Per patient it has resolved -Likely not infectious as patient's WBC was normal and patient was Afebrile; Patient had Atypical Lymphocytes on Differential Likely Viral Diarrhea; Discussed with Dr. Julien Nordmann in Hematology/Oncology -? EtOH withdrawal or Alcohol related   Generalized Weakness likely from Dehydration and poor po Intake, improved -PT/OT Eval and Treat; No further Recc's -Nutrition Consult for poor po Intake -Counseled on Alcohol Abuse -Possible Component from Diarrhea  Alcohol Dependence -Lipase was 331 on admission -AST went from 67 -> 40 -> 36 -> 66 -> 64 -ALT went from 50 -> 31 -> 28 -> 41 -> 52 -U/S of Abdomen showed There is sludge in the gallbladder. There are hyperechoic foci representing either hyperechoic sludge or non shadowing calculi. Heterogeneous liver compatible with diffuse hepatic steatosis occur diffuse hepatic parenchymal disease.There are cysts in both kidneys as described. These are not significantly changed. Maximal aortic caliber is 2.6 cm. Ectatic abdominal aorta at risk for aneurysm development. Recommend followup by ultrasound in 5 years.  -Continued CIWA protocol with Lorazepam IV Administration while in the  hospital -C/w MVI, Thiamine, and Folate -Counseled on Alcohol Abuse -Will need  to Follow up with Dr. Inda Merlin and Repeat CMP as an outpatient  Hypokalemia  -Potassium was 3.4 this AM -Repleted -Phone Consultation placed to Dr. Joelyn Oms and he states that since diarrhea has resolved electrolyte abnormalities likely it is from Alcohol or prolonged ATN. Unlikely Fanconi Syndrome. -Patient on Potassium Supplement as an outpatient -Follow Up CMP at Dr. Moses Manners Office  Hypomagnesemia  -Magnesium was 1.7 this AM -Replete with IV Mag Sulfate -Repeat Mag Level in AM -Phone Consultation placed to Dr. Joelyn Oms and he states that since diarrhea has resolved electrolyte abnormalities likely it is from Alcohol or prolonged ATN. Unlikely Fanconi Syndrome.  -Patient is on Magnesium Supplement as an outpatient -Follow Up Magnesium at Dr. Moses Manners Office  Hypophosphatemia -Phos Level was 3.0 today -Replete with K Phos 500 mg po TIDwm and Bedtime -Nutrition Consultation; C/w Esnure BID -Phone Consultation placed to Dr. Joelyn Oms and he states that since diarrhea has resolved electrolyte abnormalities likely it is from Alcohol or prolonged ATN. Unlikely Fanconi Syndrome. Stated to aggressively replete and repeat in AM -Repeat Phos Level as an Out Patient  Thrombocytopenia, Improved -Improved from 73 -> 91 -> 148 -> 159 -Likely 2/2 to Alcohol Abuse -Continue to Monitor for S/Sx of Bleeding  Diabetes Mellitus without Complication -Diet Controlled -C/w Diabetic Nutritional Supplements  Seizure Disorder -C/w Levetiracetam 500 mg po BID -Follow up with Cecille Rubin in Neurology as an Outpatient  Hypertension -Continue Home Metoprolol  Esophagitis -Continue Home Pantoprazole 40 mg po BID  Discharge Instructions  Discharge Instructions    Call MD for:  difficulty breathing, headache or visual disturbances    Complete by:  As directed    Call MD for:  persistant dizziness or light-headedness    Complete by:  As directed    Call MD for:  persistant nausea and vomiting     Complete by:  As directed    Call MD for:  temperature >100.4    Complete by:  As directed    Diet - low sodium heart healthy    Complete by:  As directed    Discharge instructions    Complete by:  As directed    Follow Up with Dr. Inda Merlin within 1 week. Take Medications as prescribed. Advised to stop drinking. Take all medications as prescribed. If symptoms change or worsen please return to PCP or ER for Evaluation.   Increase activity slowly    Complete by:  As directed        Medication List    STOP taking these medications   feeding supplement (PRO-STAT SUGAR FREE 64) Liqd   polyethylene glycol packet Commonly known as:  MIRALAX / GLYCOLAX     TAKE these medications   ferrous sulfate 325 (65 FE) MG tablet Take 1 tablet (325 mg total) by mouth 2 (two) times daily with a meal.   folic acid 1 MG tablet Commonly known as:  FOLVITE Take 1 tablet (1 mg total) by mouth daily.   lactose free nutrition Liqd Take 237 mLs by mouth daily.   levETIRAcetam 500 MG tablet Commonly known as:  KEPPRA Take 1 tablet (500 mg total) by mouth 2 (two) times daily.   magnesium oxide 400 (241.3 Mg) MG tablet Commonly known as:  MAG-OX Take 1 tablet (400 mg total) by mouth daily.   metoprolol tartrate 25 MG tablet Commonly known as:  LOPRESSOR Take 25 mg by mouth daily.   multivitamin with  minerals Tabs tablet Take 1 tablet by mouth daily.   pantoprazole 40 MG tablet Commonly known as:  PROTONIX Take 1 tablet (40 mg total) by mouth 2 (two) times daily.   potassium chloride 10 MEQ tablet Commonly known as:  K-DUR Take 1 tablet (10 mEq total) by mouth daily.   sucralfate 1 GM/10ML suspension Commonly known as:  CARAFATE Take 10 mLs (1 g total) by mouth 4 (four) times daily -  with meals and at bedtime.   testosterone 50 MG/5GM (1%) Gel Commonly known as:  ANDROGEL Place 5 g onto the skin daily.   thiamine 100 MG tablet Take 1 tablet (100 mg total) by mouth daily.       No  Known Allergies  Consultations:  -Nephrology via Phone Conversation  -Hematology/Oncology via Phone Conversation  Procedures/Studies: US Abdomen Complete  Result Date: 04/17/2016 CLINICAL DATA:  Pancreatitis EXAM: ABDOMEN ULTRASOUND COMPLETE COMPARISON:  02/10/2012 FINDINGS: Gallbladder: There is layering sludge in the gallbladder. There are some hyperechoic foci within the sludge without shadowing of unknown significance. Non shadowing calculi are a consideration. No wall thickening or Loving's sign. Common bile duct: Diameter: 3.7 mm. Liver: The liver is diffusely heterogeneous and hyperechoic without focal mass. There is a small 1.0 cm cyst in the left lobe. IVC: No abnormality visualized. Pancreas: Visualized portion unremarkable. Spleen: Size and appearance within normal limits. Right Kidney: Length: 10.9 cm. Echogenicity within normal limits. No mass or hydronephrosis visualized. There is a 0.8 cm benign appearing cyst in the lower pole. Left Kidney: Length: 10.5 cm. Echogenicity within normal limits. No mass or hydronephrosis visualized. There is a central cystic lesion measuring 2.7 x 1.3 x 1.6 cm. It has a lobulated appearance and is not significantly changed compared with the prior CT dated July 25th of this year. There is an adjacent interpolar simple cyst measuring 0.8 cm. Abdominal aorta: Maximal aortic diameter is 2.6 cm. Other findings: None. IMPRESSION: There is sludge in the gallbladder. There are hyperechoic foci representing either hyperechoic sludge or non shadowing calculi. Heterogeneous liver compatible with diffuse hepatic steatosis occur diffuse hepatic parenchymal disease. There are cysts in both kidneys as described. These are not significantly changed. Maximal aortic caliber is 2.6 cm. Ectatic abdominal aorta at risk for aneurysm development. Recommend followup by ultrasound in 5 years. This recommendation follows ACR consensus guidelines: White Paper of the ACR Incidental  Findings Committee II on Vascular Findings. J Am Coll Radiol 2013; 10:789-794. Electronically Signed   By: Marybelle Killings M.D.   On: 04/17/2016 09:53   Subjective: Seen and examined at bedside and wife was at bedside. Denied any Active complaints and was ready to go home. No CP/SOB/N/V or Abdominal Pain. No Lightheadedness or Dizziness.  Discharge Exam: Vitals:   04/22/16 1000 04/22/16 1514  BP: 136/75 124/67  Pulse: 69 67  Resp: 16 20  Temp: 99.6 F (37.6 C) 99 F (37.2 C)   Vitals:   04/21/16 2125 04/22/16 0548 04/22/16 1000 04/22/16 1514  BP: 137/83 128/82 136/75 124/67  Pulse: 70 74 69 67  Resp: 18 18 16 20   Temp: 98.7 F (37.1 C) 99.1 F (37.3 C) 99.6 F (37.6 C) 99 F (37.2 C)  TempSrc: Oral Oral Oral Oral  SpO2: 95% 95% 94% 95%  Weight:  57.7 kg (127 lb 3.3 oz)    Height:       General: Pt is alert, awake, not in acute distress Cardiovascular: RRR, S1/S2 +, no rubs, no gallops Respiratory: CTA bilaterally, no  wheezing, no rhonchi Abdominal: Soft, NT, ND, bowel sounds + Extremities: no edema, no cyanosis  The results of significant diagnostics from this hospitalization (including imaging, microbiology, ancillary and laboratory) are listed below for reference.    Microbiology: No results found for this or any previous visit (from the past 240 hour(s)).   Labs: BNP (last 3 results)  Recent Labs  04/19/16 1852  BNP [id number]   Basic Metabolic Panel:  Recent Labs Lab 04/18/16 1044 04/19/16 0545 04/19/16 1916 04/20/16 0609 04/20/16 1422 04/20/16 2005 04/21/16 0641 04/21/16 1441 04/22/16 0452  NA 136 138 138 140 138  --  138  --  139  K 2.9* 2.7* 3.3* 2.9* 3.8  --  2.8*  --  3.4*  CL 106 105 105 108 109  --  106  --  103  CO2 18* 21* 23 23 21*  --  23  --  29  GLUCOSE 124* 135* 122* 137* 176*  --  146*  --  161*  BUN 7 7 10 7 8   --  5*  --  7  CREATININE 1.01 0.91 0.76 0.71 0.63  --  0.67  --  0.76  CALCIUM 9.1 9.1 9.5 9.0 9.1  --  8.9  --  8.7*  MG 1.5*  1.7  --  1.4*  --   --  1.4*  --  1.7  PHOS <1.0* 1.3*  --  2.4*  --  2.3*  --  3.0  --    Liver Function Tests:  Recent Labs Lab 04/16/16 1738 04/18/16 1044 04/19/16 0545 04/21/16 0641 04/22/16 0452  AST 67* 40 36 66* 64*  ALT 50 31 28 41 52  ALKPHOS 95 81 80 70 67  BILITOT 1.7* 1.6* 1.5* 0.7 0.7  PROT 9.0* 6.9 6.9 6.4* 6.4*  ALBUMIN 4.8 3.3* 3.1* 3.1* 2.9*    Recent Labs Lab 04/16/16 1738  LIPASE 331*   No results for input(s): AMMONIA in the last 168 hours. CBC:  Recent Labs Lab 04/17/16 0656 04/18/16 1044 04/19/16 0545 04/21/16 0641 04/22/16 0452  WBC 6.6 6.5 6.7 5.2 5.6  NEUTROABS  --  5.0 4.6 3.0 3.3  HGB 14.1 14.4 14.0 13.4 12.6*  HCT 42.0 42.6 40.5 39.0 37.4*  MCV 94.2 93.8 91.0 91.5 93.3  PLT 76* 73* 91* 148* 159   Cardiac Enzymes: No results for input(s): CKTOTAL, CKMB, CKMBINDEX, TROPONINI in the last 168 hours. BNP: Invalid input(s): POCBNP CBG: No results for input(s): GLUCAP in the last 168 hours. D-Dimer No results for input(s): DDIMER in the last 72 hours. Hgb A1c No results for input(s): HGBA1C in the last 72 hours. Lipid Profile No results for input(s): CHOL, HDL, LDLCALC, TRIG, CHOLHDL, LDLDIRECT in the last 72 hours. Thyroid function studies No results for input(s): TSH, T4TOTAL, T3FREE, THYROIDAB in the last 72 hours.  Invalid input(s): FREET3 Anemia work up No results for input(s): VITAMINB12, FOLATE, FERRITIN, TIBC, IRON, RETICCTPCT in the last 72 hours. Urinalysis    Component Value Date/Time   COLORURINE AMBER (A) 04/16/2016 2327   APPEARANCEUR CLOUDY (A) 04/16/2016 2327   LABSPEC 1.023 04/16/2016 2327   PHURINE 6.0 04/16/2016 2327   GLUCOSEU NEGATIVE 04/16/2016 2327   HGBUR MODERATE (A) 04/16/2016 2327   BILIRUBINUR MODERATE (A) 04/16/2016 2327   KETONESUR >80 (A) 04/16/2016 2327   PROTEINUR 100 (A) 04/16/2016 2327   UROBILINOGEN 0.2 07/27/2014 1916   NITRITE NEGATIVE 04/16/2016 2327   LEUKOCYTESUR NEGATIVE 04/16/2016  2327   Sepsis Labs Invalid input(s):  PROCALCITONIN,  WBC,  LACTICIDVEN Microbiology No results found for this or any previous visit (from the past 240 hour(s)).  Time coordinating discharge: Over 30 minutes  SIGNED:  Kerney Elbe, DO [facility] 04/22/2016, 3:35 PM Pager [phone]  If 7PM-7AM, please contact night-coverage [email or web address] Password [email or web address]

## 2016-07-16 DIAGNOSIS — R69 Illness, unspecified: Secondary | ICD-10-CM | POA: Diagnosis not present

## 2016-07-16 DIAGNOSIS — K219 Gastro-esophageal reflux disease without esophagitis: Secondary | ICD-10-CM | POA: Diagnosis not present

## 2016-07-16 DIAGNOSIS — Z6825 Body mass index (BMI) 25.0-25.9, adult: Secondary | ICD-10-CM | POA: Diagnosis not present

## 2016-07-16 DIAGNOSIS — I10 Essential (primary) hypertension: Secondary | ICD-10-CM | POA: Diagnosis not present

## 2016-07-16 DIAGNOSIS — G40909 Epilepsy, unspecified, not intractable, without status epilepticus: Secondary | ICD-10-CM | POA: Diagnosis not present

## 2016-07-16 DIAGNOSIS — M159 Polyosteoarthritis, unspecified: Secondary | ICD-10-CM | POA: Diagnosis not present

## 2016-07-16 DIAGNOSIS — N401 Enlarged prostate with lower urinary tract symptoms: Secondary | ICD-10-CM | POA: Diagnosis not present

## 2016-07-16 DIAGNOSIS — Z0000 Encounter for general adult medical examination without abnormal findings: Secondary | ICD-10-CM | POA: Diagnosis not present

## 2016-07-16 DIAGNOSIS — Z87891 Personal history of nicotine dependence: Secondary | ICD-10-CM | POA: Diagnosis not present

## 2016-07-31 DIAGNOSIS — M19012 Primary osteoarthritis, left shoulder: Secondary | ICD-10-CM | POA: Diagnosis not present

## 2016-07-31 DIAGNOSIS — M4690 Unspecified inflammatory spondylopathy, site unspecified: Secondary | ICD-10-CM | POA: Diagnosis not present

## 2016-07-31 DIAGNOSIS — M1711 Unilateral primary osteoarthritis, right knee: Secondary | ICD-10-CM | POA: Diagnosis not present

## 2016-07-31 DIAGNOSIS — M5136 Other intervertebral disc degeneration, lumbar region: Secondary | ICD-10-CM | POA: Diagnosis not present

## 2016-07-31 DIAGNOSIS — M19042 Primary osteoarthritis, left hand: Secondary | ICD-10-CM | POA: Diagnosis not present

## 2016-07-31 DIAGNOSIS — M1712 Unilateral primary osteoarthritis, left knee: Secondary | ICD-10-CM | POA: Diagnosis not present

## 2016-08-20 DIAGNOSIS — I10 Essential (primary) hypertension: Secondary | ICD-10-CM | POA: Diagnosis not present

## 2016-08-20 DIAGNOSIS — E876 Hypokalemia: Secondary | ICD-10-CM | POA: Diagnosis not present

## 2016-08-20 DIAGNOSIS — M19042 Primary osteoarthritis, left hand: Secondary | ICD-10-CM | POA: Diagnosis not present

## 2016-08-20 DIAGNOSIS — M19012 Primary osteoarthritis, left shoulder: Secondary | ICD-10-CM | POA: Diagnosis not present

## 2016-08-20 DIAGNOSIS — R69 Illness, unspecified: Secondary | ICD-10-CM | POA: Diagnosis not present

## 2016-08-20 DIAGNOSIS — M4690 Unspecified inflammatory spondylopathy, site unspecified: Secondary | ICD-10-CM | POA: Diagnosis not present

## 2016-10-17 DIAGNOSIS — Z0000 Encounter for general adult medical examination without abnormal findings: Secondary | ICD-10-CM | POA: Diagnosis not present

## 2016-10-17 DIAGNOSIS — Z1389 Encounter for screening for other disorder: Secondary | ICD-10-CM | POA: Diagnosis not present

## 2016-10-22 DIAGNOSIS — E1165 Type 2 diabetes mellitus with hyperglycemia: Secondary | ICD-10-CM | POA: Diagnosis not present

## 2016-11-27 ENCOUNTER — Encounter: Payer: Self-pay | Admitting: Nurse Practitioner

## 2016-11-27 ENCOUNTER — Ambulatory Visit (INDEPENDENT_AMBULATORY_CARE_PROVIDER_SITE_OTHER): Payer: Medicare HMO | Admitting: Nurse Practitioner

## 2016-11-27 ENCOUNTER — Encounter (INDEPENDENT_AMBULATORY_CARE_PROVIDER_SITE_OTHER): Payer: Self-pay

## 2016-11-27 VITALS — BP 118/75 | HR 74 | Ht 62.0 in | Wt 135.2 lb

## 2016-11-27 DIAGNOSIS — G40909 Epilepsy, unspecified, not intractable, without status epilepticus: Secondary | ICD-10-CM

## 2016-11-27 DIAGNOSIS — R569 Unspecified convulsions: Secondary | ICD-10-CM

## 2016-11-27 MED ORDER — LEVETIRACETAM 500 MG PO TABS: 500.0000 mg | ORAL_TABLET | Freq: Two times a day (BID) | ORAL | 3 refills | Status: DC

## 2016-11-27 NOTE — Progress Notes (Signed)
I have read the note, and I agree with the clinical assessment and plan.  WILLIS,CHARLES KEITH   

## 2016-11-27 NOTE — Patient Instructions (Signed)
Continue Keppra 500 twice daily will refill for one year Call for any seizure activity Follow-up yearly and when necessary  
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
## 2017-04-21 DIAGNOSIS — M1711 Unilateral primary osteoarthritis, right knee: Secondary | ICD-10-CM | POA: Diagnosis not present

## 2017-04-21 DIAGNOSIS — M1712 Unilateral primary osteoarthritis, left knee: Secondary | ICD-10-CM | POA: Diagnosis not present

## 2017-06-23 DIAGNOSIS — M19012 Primary osteoarthritis, left shoulder: Secondary | ICD-10-CM | POA: Diagnosis not present

## 2017-06-23 DIAGNOSIS — M19042 Primary osteoarthritis, left hand: Secondary | ICD-10-CM | POA: Diagnosis not present

## 2017-06-23 DIAGNOSIS — M1990 Unspecified osteoarthritis, unspecified site: Secondary | ICD-10-CM | POA: Diagnosis not present

## 2017-06-23 DIAGNOSIS — M169 Osteoarthritis of hip, unspecified: Secondary | ICD-10-CM | POA: Diagnosis not present

## 2017-06-23 DIAGNOSIS — N400 Enlarged prostate without lower urinary tract symptoms: Secondary | ICD-10-CM | POA: Diagnosis not present

## 2017-06-23 DIAGNOSIS — I10 Essential (primary) hypertension: Secondary | ICD-10-CM | POA: Diagnosis not present

## 2017-06-23 DIAGNOSIS — E1165 Type 2 diabetes mellitus with hyperglycemia: Secondary | ICD-10-CM | POA: Diagnosis not present

## 2017-06-23 DIAGNOSIS — R69 Illness, unspecified: Secondary | ICD-10-CM | POA: Diagnosis not present

## 2017-07-09 ENCOUNTER — Encounter (HOSPITAL_COMMUNITY): Payer: Self-pay | Admitting: Emergency Medicine

## 2017-07-09 ENCOUNTER — Emergency Department (HOSPITAL_COMMUNITY)
Admission: EM | Admit: 2017-07-09 | Discharge: 2017-07-09 | Disposition: A | Discharge status: Home / Self Care | Payer: Medicare HMO | Attending: Emergency Medicine | Admitting: Emergency Medicine

## 2017-07-09 ENCOUNTER — Emergency Department (HOSPITAL_COMMUNITY): Payer: Medicare HMO

## 2017-07-09 DIAGNOSIS — Y9389 Activity, other specified: Secondary | ICD-10-CM | POA: Diagnosis not present

## 2017-07-09 DIAGNOSIS — Y998 Other external cause status: Secondary | ICD-10-CM | POA: Insufficient documentation

## 2017-07-09 DIAGNOSIS — S0993XA Unspecified injury of face, initial encounter: Secondary | ICD-10-CM | POA: Insufficient documentation

## 2017-07-09 DIAGNOSIS — E119 Type 2 diabetes mellitus without complications: Secondary | ICD-10-CM | POA: Insufficient documentation

## 2017-07-09 DIAGNOSIS — Z79899 Other long term (current) drug therapy: Secondary | ICD-10-CM | POA: Insufficient documentation

## 2017-07-09 DIAGNOSIS — W228XXA Striking against or struck by other objects, initial encounter: Secondary | ICD-10-CM | POA: Insufficient documentation

## 2017-07-09 DIAGNOSIS — I10 Essential (primary) hypertension: Secondary | ICD-10-CM | POA: Insufficient documentation

## 2017-07-09 DIAGNOSIS — Z87891 Personal history of nicotine dependence: Secondary | ICD-10-CM | POA: Insufficient documentation

## 2017-07-09 DIAGNOSIS — Y929 Unspecified place or not applicable: Secondary | ICD-10-CM | POA: Insufficient documentation

## 2017-07-09 DIAGNOSIS — S0591XA Unspecified injury of right eye and orbit, initial encounter: Secondary | ICD-10-CM | POA: Diagnosis present

## 2017-07-09 DIAGNOSIS — S0511XA Contusion of eyeball and orbital tissues, right eye, initial encounter: Secondary | ICD-10-CM | POA: Diagnosis not present

## 2017-07-09 NOTE — ED Triage Notes (Signed)
Merry Proud PA at bedside, verbal for CT Maxillofacial WO contrast
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
## 2017-07-09 NOTE — ED Provider Notes (Signed)
[facility] EMERGENCY DEPARTMENT Provider Note   CSN: [id number] Arrival date & time: 07/09/17  1833     History   Chief Complaint Chief Complaint  Patient presents with  . Eye Injury    HPI Whalen Trompeter is a 77 y.o. male.  HPI 77 year old male presents today with complaints of facial trauma.  He notes 2 days ago he dropped his tailgate down hitting his right eyebrow.  He notes since then he has had swelling around the periorbital region.  He denies any involvement of the eye itself.  He notes very minimal pain, he notes his vision is intact, no neurologic deficits, no headache, no loss of consciousness S, no painful ocular movements, patient is not on blood thinners.  Past Medical History:  Diagnosis Date  . Arthritis   . Diabetes mellitus    type 2-no meds  . Glaucoma   . Hypertension   . Low testosterone   . Seizures ([place])    first12/12-none since  . Sleep apnea    nasal CPAP at HS    Patient Active Problem List   Diagnosis Date Noted  . Hypomagnesemia 04/21/2016  . Diarrhea 04/21/2016  . Dehydration 04/17/2016  . Metabolic acidosis 60/63/0160  . AKI (acute kidney injury) ([place])   . Malnutrition of moderate degree 01/19/2016  . Orthostatic hypotension 01/16/2016  . Dysphagia 01/16/2016  . Tachycardia 01/16/2016  . Weight loss 01/16/2016  . Seizure disorder ([place]) 01/16/2016  . Elevated transaminase level   . Hypokalemia   . Alcohol dependence ([place]) 01/18/2013  . Onychomycosis 12/01/2012  . Pain in joint, ankle and foot 12/01/2012  . UTI (lower urinary tract infection) 02/09/2012  . Seizures, generalized convulsive ([place]) 06/03/2011  . HTN (hypertension) 06/03/2011  . Diabetes mellitus with complication ([place]) 10/93/2355  . Anemia 06/03/2011    Past Surgical History:  Procedure Laterality Date  . COLONOSCOPY    . ESOPHAGOGASTRODUODENOSCOPY N/A 01/17/2016   Procedure: ESOPHAGOGASTRODUODENOSCOPY (EGD);  Surgeon: Wilford Corner, MD;   Location: [facility] ENDOSCOPY;  Service: Endoscopy;  Laterality: N/A;  . hemrhoidectomy  1988  . LIPOSUCTION  10/07/2011   Procedure: LIPOSUCTION;  Surgeon: Cristine Polio, MD;  Location: [facility];  Service: Plastics;  Laterality: Left;  Marland Kitchen MASS EXCISION  10/07/2011   Procedure: EXCISION MASS;  Surgeon: Cristine Polio, MD;  Location: [facility];  Service: Plastics;  Laterality: Left;  excision of large mass on left back with possible lipo assistence       Home Medications    Prior to Admission medications   Medication Sig Start Date End Date Taking? Authorizing Provider  ferrous sulfate 325 (65 FE) MG tablet Take 1 tablet (325 mg total) by mouth 2 (two) times daily with a meal. 01/21/16   Mikhail, Velta Addison, DO  folic acid (FOLVITE) 1 MG tablet Take 1 tablet (1 mg total) by mouth daily. 01/21/16   Mikhail, Velta Addison, DO  lactose free nutrition (BOOST) LIQD Take 237 mLs by mouth daily.    [provider]  levETIRAcetam (KEPPRA) 500 MG tablet Take 1 tablet (500 mg total) by mouth 2 (two) times daily. 11/27/16   Dennie Bible, NP  magnesium oxide (MAG-OX) 400 (241.3 Mg) MG tablet Take 1 tablet (400 mg total) by mouth daily. 01/21/16   Mikhail, Velta Addison, DO  metoprolol tartrate (LOPRESSOR) 25 MG tablet Take 25 mg by mouth daily.    [provider]  Multiple Vitamin (MULTIVITAMIN WITH MINERALS) TABS tablet Take 1 tablet by mouth  daily. 01/21/16   Cristal Ford, DO  pantoprazole (PROTONIX) 40 MG tablet Take 1 tablet (40 mg total) by mouth 2 (two) times daily. 01/21/16   Mikhail, Velta Addison, DO  potassium chloride (K-DUR) 10 MEQ tablet Take 1 tablet (10 mEq total) by mouth daily. 01/21/16   Mikhail, Velta Addison, DO  sucralfate (CARAFATE) 1 GM/10ML suspension Take 10 mLs (1 g total) by mouth 4 (four) times daily -  with meals and at bedtime. 01/21/16   Cristal Ford, DO  testosterone (ANDROGEL) 50 MG/5GM GEL Place 5 g onto the skin daily.    [provider]  thiamine 100 MG tablet Take 1 tablet (100 mg total) by mouth daily. 01/21/16   Cristal Ford, DO    Family History Family History  Problem Relation Age of Onset  . Alcohol abuse Father   . Alcohol abuse Paternal Grandfather     Social History Social History   Tobacco Use  . Smoking status: Former Smoker    Last attempt to quit: 10/03/1966    Years since quitting: 50.8  . Smokeless tobacco: Never Used  . Tobacco comment: quit smoking 1971  Substance Use Topics  . Alcohol use: Yes    Alcohol/week: 12.6 oz    Types: 21 Shots of liquor per week    Comment: occ  . Drug use: No     Allergies   Patient has no known allergies.   Review of Systems Review of Systems  All other systems reviewed and are negative.    Physical Exam Updated Vital Signs BP (!) 146/95 (BP Location: Right Arm)   Pulse (!) 101   Temp 98.3 F (36.8 C)   Resp 18   Ht 5\' 2"  (1.575 m)   Wt 61.2 kg (135 lb)   SpO2 97%   BMI 24.69 kg/m   Physical Exam  Constitutional: He is oriented to person, place, and time. He appears well-developed and well-nourished.  HENT:  Head: Normocephalic.  Hematoma noted around the right periocular soft tissue, no open lacerations, globe intact, pain-free extraocular movements, vision intact no other signs of trauma to the head  Eyes: Conjunctivae are normal. Pupils are equal, round, and reactive to light. Right eye exhibits no discharge. Left eye exhibits no discharge. No scleral icterus.  Neck: Normal range of motion. No JVD present. No tracheal deviation present.  Pulmonary/Chest: Effort normal. No stridor.  Musculoskeletal:  Neck supple full range of motion nontender to palpation  Neurological: He is alert and oriented to person, place, and time. No cranial nerve deficit or sensory deficit. He exhibits normal muscle tone. Coordination normal.  Psychiatric: He has a normal mood and affect. His behavior is normal. Judgment and thought content normal.  Nursing  note and vitals reviewed.    ED Treatments / Results  Labs (all labs ordered are listed, but only abnormal results are displayed) Labs Reviewed - No data to display  EKG  EKG Interpretation None       Radiology Ct Maxillofacial Wo Contrast  Result Date: 07/09/2017 CLINICAL DATA:  Injury to the right eye with significant bruising and swelling EXAM: CT MAXILLOFACIAL WITHOUT CONTRAST TECHNIQUE: Multidetector CT imaging of the maxillofacial structures was performed. Multiplanar CT image reconstructions were also generated. COMPARISON:  CT brain 08/24/2012, CT orbits 04/17/2012 FINDINGS: Osseous: Bilateral mandibular heads are normally positioned. No mandibular fracture. Pterygoid plates and zygomatic arches are intact. No nasal bone fracture Orbits: No intraconal soft tissue swelling. The globes appear intact. No acute orbital wall fracture. Old  left orbital floor fracture. Sinuses: Mucosal thickening in the maxillary and ethmoid sinuses. No sinus wall fracture Soft tissues: Moderate to large amount of right periorbital soft tissue swelling. Limited intracranial: Atrophy.  No acute abnormality IMPRESSION: 1. No definite acute facial bone fracture is seen. 2. Moderate-to-marked right periorbital soft tissue swelling 3. Old fracture involving the floor of the left orbit Electronically Signed   By: Donavan Foil M.D.   On: 07/09/2017 20:56    Procedures Procedures (including critical care time)  Medications Ordered in ED Medications - No data to display   Initial Impression / Assessment and Plan / ED Course  I have reviewed the triage vital signs and the nursing notes.  Pertinent labs & imaging results that were available during my care of the patient were reviewed by me and considered in my medical decision making (see chart for details).      Final Clinical Impressions(s) / ED Diagnoses   Final diagnoses:  Facial injury, initial encounter    77 year old male presents today with  facial trauma.  This happened several days ago with an injury to his right eyebrow.  He now has a hematoma secondary to dependent blood pooling.  This is very minor, he has no ocular involvement, well-appearing in no acute distress.  He will be discharged home with strict return precautions, symptomatic care instructions.  Patient verbalized understanding and agreement to today's plan had no further questions or concerns at the time of discharge.    ED Discharge Orders    None       Francee Gentile 07/09/17 2135    Okey Regal, PA-C 07/09/17 2136    Dorie Rank, MD 07/11/17 [phone]

## 2017-07-09 NOTE — ED Triage Notes (Signed)
Pt reports he closed tailgate on his truck last night and hit R eye. Pt has significant bruising and swelling noted. Pt denies LOC, denies thinners. No dizziness, lightheadedness, N/V, blurred vision, etc.

## 2017-07-09 NOTE — Discharge Instructions (Signed)
Please read attached information. If you experience any new or worsening signs or symptoms please return to the emergency room for evaluation. Please follow-up with your primary care provider or specialist as discussed.  °

## 2017-07-30 DIAGNOSIS — Z23 Encounter for immunization: Secondary | ICD-10-CM | POA: Diagnosis not present

## 2017-08-27 DIAGNOSIS — M19012 Primary osteoarthritis, left shoulder: Secondary | ICD-10-CM | POA: Diagnosis not present

## 2017-08-27 DIAGNOSIS — R69 Illness, unspecified: Secondary | ICD-10-CM | POA: Diagnosis not present

## 2017-08-27 DIAGNOSIS — N400 Enlarged prostate without lower urinary tract symptoms: Secondary | ICD-10-CM | POA: Diagnosis not present

## 2017-08-27 DIAGNOSIS — M169 Osteoarthritis of hip, unspecified: Secondary | ICD-10-CM | POA: Diagnosis not present

## 2017-08-27 DIAGNOSIS — E1165 Type 2 diabetes mellitus with hyperglycemia: Secondary | ICD-10-CM | POA: Diagnosis not present

## 2017-08-27 DIAGNOSIS — M1990 Unspecified osteoarthritis, unspecified site: Secondary | ICD-10-CM | POA: Diagnosis not present

## 2017-08-27 DIAGNOSIS — M19042 Primary osteoarthritis, left hand: Secondary | ICD-10-CM | POA: Diagnosis not present

## 2017-08-27 DIAGNOSIS — I10 Essential (primary) hypertension: Secondary | ICD-10-CM | POA: Diagnosis not present

## 2017-10-14 ENCOUNTER — Emergency Department (HOSPITAL_COMMUNITY): Payer: Medicare HMO

## 2017-10-14 ENCOUNTER — Other Ambulatory Visit: Payer: Self-pay

## 2017-10-14 ENCOUNTER — Encounter (HOSPITAL_COMMUNITY): Payer: Self-pay

## 2017-10-14 ENCOUNTER — Emergency Department (HOSPITAL_COMMUNITY)
Admission: EM | Admit: 2017-10-14 | Discharge: 2017-10-15 | Disposition: A | Discharge status: Home / Self Care | Payer: Medicare HMO | Attending: Emergency Medicine | Admitting: Emergency Medicine

## 2017-10-14 DIAGNOSIS — E1165 Type 2 diabetes mellitus with hyperglycemia: Secondary | ICD-10-CM | POA: Insufficient documentation

## 2017-10-14 DIAGNOSIS — I10 Essential (primary) hypertension: Secondary | ICD-10-CM | POA: Insufficient documentation

## 2017-10-14 DIAGNOSIS — Z87891 Personal history of nicotine dependence: Secondary | ICD-10-CM | POA: Insufficient documentation

## 2017-10-14 DIAGNOSIS — R918 Other nonspecific abnormal finding of lung field: Secondary | ICD-10-CM | POA: Diagnosis not present

## 2017-10-14 DIAGNOSIS — R531 Weakness: Secondary | ICD-10-CM | POA: Diagnosis present

## 2017-10-14 DIAGNOSIS — E876 Hypokalemia: Secondary | ICD-10-CM | POA: Insufficient documentation

## 2017-10-14 DIAGNOSIS — R739 Hyperglycemia, unspecified: Secondary | ICD-10-CM

## 2017-10-14 DIAGNOSIS — Z79899 Other long term (current) drug therapy: Secondary | ICD-10-CM | POA: Diagnosis not present

## 2017-10-14 LAB — CBG MONITORING, ED
GLUCOSE-CAPILLARY: 325 mg/dL — AB (ref 65–99)
Glucose-Capillary: 386 mg/dL — ABNORMAL HIGH (ref 65–99)

## 2017-10-14 LAB — URINALYSIS, ROUTINE W REFLEX MICROSCOPIC
Bacteria, UA: NONE SEEN
Bilirubin Urine: NEGATIVE
Hgb urine dipstick: NEGATIVE
Ketones, ur: 20 mg/dL — AB
Leukocytes, UA: NEGATIVE
Nitrite: NEGATIVE
PH: 6 (ref 5.0–8.0)
Protein, ur: NEGATIVE mg/dL
SPECIFIC GRAVITY, URINE: 1.033 — AB (ref 1.005–1.030)

## 2017-10-14 LAB — CBC
HCT: 39.5 % (ref 39.0–52.0)
Hemoglobin: 13.9 g/dL (ref 13.0–17.0)
MCH: 34.9 pg — ABNORMAL HIGH (ref 26.0–34.0)
MCHC: 35.2 g/dL (ref 30.0–36.0)
MCV: 99.2 fL (ref 78.0–100.0)
PLATELETS: 181 10*3/uL (ref 150–400)
RBC: 3.98 MIL/uL — ABNORMAL LOW (ref 4.22–5.81)
RDW: 13.7 % (ref 11.5–15.5)
WBC: 6.1 10*3/uL (ref 4.0–10.5)

## 2017-10-14 LAB — BASIC METABOLIC PANEL
Anion gap: 16 — ABNORMAL HIGH (ref 5–15)
BUN: 21 mg/dL — AB (ref 6–20)
CHLORIDE: 101 mmol/L (ref 101–111)
CO2: 18 mmol/L — ABNORMAL LOW (ref 22–32)
CREATININE: 0.99 mg/dL (ref 0.61–1.24)
Calcium: 10.5 mg/dL — ABNORMAL HIGH (ref 8.9–10.3)
GFR calc Af Amer: >60 mL/min (ref >60)
GLUCOSE: 397 mg/dL — AB (ref 65–99)
Potassium: 3.2 mmol/L — ABNORMAL LOW (ref 3.5–5.1)
SODIUM: 135 mmol/L (ref 135–145)

## 2017-10-14 MED ORDER — SODIUM CHLORIDE 0.9 % IV SOLN
INTRAVENOUS | Status: DC
  Administered 2017-10-14: 3.3 [IU]/h via INTRAVENOUS
  Filled 2017-10-14: qty 1

## 2017-10-14 MED ORDER — POTASSIUM CHLORIDE 10 MEQ/100ML IV SOLN
10.0000 meq | INTRAVENOUS | Status: DC
  Administered 2017-10-14 (×2): 10 meq via INTRAVENOUS
  Filled 2017-10-14 (×2): qty 100

## 2017-10-14 MED ORDER — SODIUM CHLORIDE 0.9 % IV BOLUS
1000.0000 mL | Freq: Once | INTRAVENOUS | Status: AC
  Administered 2017-10-14: 1000 mL via INTRAVENOUS

## 2017-10-14 NOTE — ED Notes (Signed)
ED Provider at bedside. 

## 2017-10-14 NOTE — ED Provider Notes (Addendum)
[facility] EMERGENCY DEPARTMENT Provider Note   CSN: [id number] Arrival date & time: 10/14/17  1428     History   Chief Complaint Chief Complaint  Patient presents with  . Weakness    HPI Joseph Copeland is a 77 y.o. male presenting for evaluation of weakness.  Patient states over the past week, he has had increasing weakness, tiredness, and lightheadedness.  He has not been eating well, stating he has no appetite.  Daughter states he has had almost nothing to eat over the past week.  He reports 5-day history of bilateral eye irritation and purulent drainage, with crusting when he wakes up.  He denies fevers, chills, sore throat, cough, chest pain, shortness of breath, nausea, vomiting, abdominal pain, urinary symptoms, abnormal bowel movements.  He reports he has been feeling more thirsty than normal.  He has a history of diabetes, but he is not currently taking any medication.  His primary care doctor wants him to be on metformin.  He used to be on medication several years ago.  Has a history of dysphagia, chokes frequently on solid foods.  HPI  Past Medical History:  Diagnosis Date  . Arthritis   . Diabetes mellitus    type 2-no meds  . Glaucoma   . Hypertension   . Low testosterone   . Seizures ([place])    first12/12-none since  . Sleep apnea    nasal CPAP at HS    Patient Active Problem List   Diagnosis Date Noted  . Hypomagnesemia 04/21/2016  . Diarrhea 04/21/2016  . Dehydration 04/17/2016  . Metabolic acidosis 73/53/2992  . AKI (acute kidney injury) ([place])   . Malnutrition of moderate degree 01/19/2016  . Orthostatic hypotension 01/16/2016  . Dysphagia 01/16/2016  . Tachycardia 01/16/2016  . Weight loss 01/16/2016  . Seizure disorder ([place]) 01/16/2016  . Elevated transaminase level   . Hypokalemia   . Alcohol dependence ([place]) 01/18/2013  . Onychomycosis 12/01/2012  . Pain in joint, ankle and foot 12/01/2012  . UTI (lower urinary tract  infection) 02/09/2012  . Seizures, generalized convulsive ([place]) 06/03/2011  . HTN (hypertension) 06/03/2011  . Diabetes mellitus with complication ([place]) 42/68/3419  . Anemia 06/03/2011    Past Surgical History:  Procedure Laterality Date  . COLONOSCOPY    . ESOPHAGOGASTRODUODENOSCOPY N/A 01/17/2016   Procedure: ESOPHAGOGASTRODUODENOSCOPY (EGD);  Surgeon: Wilford Corner, MD;  Location[facility] ENDOSCOPY;  Service: Endoscopy;  Laterality: N/A;  . hemrhoidectomy  1988  . LIPOSUCTION  10/07/2011   Procedure: LIPOSUCTION;  Surgeon: Cristine Polio, MD;  Location: [facility];  Service: Plastics;  Laterality: Left;  Marland Kitchen MASS EXCISION  10/07/2011   Procedure: EXCISION MASS;  Surgeon: Cristine Polio, MD;  Location: [facility];  Service: Plastics;  Laterality: Left;  excision of large mass on left back with possible lipo assistence        Home Medications    Prior to Admission medications   Medication Sig Start Date End Date Taking? Authorizing Provider  ferrous sulfate 325 (65 FE) MG tablet Take 1 tablet (325 mg total) by mouth 2 (two) times daily with a meal. 01/21/16   Mikhail, Velta Addison, DO  folic acid (FOLVITE) 1 MG tablet Take 1 tablet (1 mg total) by mouth daily. 01/21/16   Mikhail, Velta Addison, DO  lactose free nutrition (BOOST) LIQD Take 237 mLs by mouth daily.    [provider]  levETIRAcetam (KEPPRA) 500 MG tablet Take 1 tablet (500 mg total) by mouth 2 (  two) times daily. 11/27/16   Dennie Bible, NP  magnesium oxide (MAG-OX) 400 (241.3 Mg) MG tablet Take 1 tablet (400 mg total) by mouth daily. 01/21/16   Mikhail, Velta Addison, DO  metoprolol tartrate (LOPRESSOR) 25 MG tablet Take 25 mg by mouth daily.    [provider]  Multiple Vitamin (MULTIVITAMIN WITH MINERALS) TABS tablet Take 1 tablet by mouth daily. 01/21/16   Mikhail, Velta Addison, DO  pantoprazole (PROTONIX) 40 MG tablet Take 1 tablet (40 mg total) by mouth 2 (two) times daily. 01/21/16    Mikhail, Velta Addison, DO  potassium chloride (K-DUR) 10 MEQ tablet Take 1 tablet (10 mEq total) by mouth daily. 01/21/16   Mikhail, Velta Addison, DO  sucralfate (CARAFATE) 1 GM/10ML suspension Take 10 mLs (1 g total) by mouth 4 (four) times daily -  with meals and at bedtime. 01/21/16   Cristal Ford, DO  testosterone (ANDROGEL) 50 MG/5GM GEL Place 5 g onto the skin daily.    [provider]  thiamine 100 MG tablet Take 1 tablet (100 mg total) by mouth daily. 01/21/16   Cristal Ford, DO    Family History Family History  Problem Relation Age of Onset  . Alcohol abuse Father   . Alcohol abuse Paternal Grandfather     Social History Social History   Tobacco Use  . Smoking status: Former Smoker    Last attempt to quit: 10/03/1966    Years since quitting: 51.0  . Smokeless tobacco: Never Used  . Tobacco comment: quit smoking 1971  Substance Use Topics  . Alcohol use: Yes    Alcohol/week: 12.6 oz    Types: 21 Shots of liquor per week    Comment: occ  . Drug use: No     Allergies   Patient has no known allergies.   Review of Systems Review of Systems  Eyes: Positive for discharge and redness.  Endocrine: Positive for polyuria.  Neurological: Positive for weakness and light-headedness.  All other systems reviewed and are negative.    Physical Exam Updated Vital Signs BP 115/74 (BP Location: Right Arm)   Pulse 92   Temp 98.1 F (36.7 C) (Oral)   Resp 16   Ht 5\' 2"  (1.575 m)   Wt 61.2 kg (135 lb)   SpO2 96%   BMI 24.69 kg/m   Physical Exam  Constitutional: He is oriented to person, place, and time. He appears well-developed and well-nourished. No distress.  Patient appears dehydrated, but no acute distress.  HENT:  Head: Normocephalic and atraumatic.  Right Ear: Tympanic membrane, external ear and ear canal normal.  Left Ear: Tympanic membrane, external ear and ear canal normal.  Mouth/Throat: Uvula is midline and oropharynx is clear and moist. Mucous  membranes are dry.  MM dry.  TMs nonerythematous and nonbulging bilaterally.  OP clear without tonsillar swelling or exudate.  Eyes: Pupils are equal, round, and reactive to light. EOM are normal. Right eye exhibits discharge. Left eye exhibits discharge. Right conjunctiva is injected. Left conjunctiva is injected.  Mild conjunctival injection bilaterally.  Minimal crusting noted.  EOMI and PERRLA.  No nystagmus.  Neck: Normal range of motion. Neck supple.  Cardiovascular: Normal rate, regular rhythm and intact distal pulses.  Pulmonary/Chest: Effort normal and breath sounds normal. No respiratory distress. He has no wheezes.  Clear lung sounds in all fields  Abdominal: Soft. He exhibits no distension and no mass. There is no tenderness. There is no guarding.  No tenderness to palpation of the abdomen.  Musculoskeletal:  Normal range of motion.  Radial and pedal pulses equal bilaterally.  Neurological: He is alert and oriented to person, place, and time.  Skin: Skin is warm and dry.  Psychiatric: He has a normal mood and affect.  Nursing note and vitals reviewed.    ED Treatments / Results  Labs (all labs ordered are listed, but only abnormal results are displayed) Labs Reviewed  BASIC METABOLIC PANEL - Abnormal; Notable for the following components:      Result Value   Potassium 3.2 (*)    CO2 18 (*)    Glucose, Bld 397 (*)    BUN 21 (*)    Calcium 10.5 (*)    Anion gap 16 (*)    All other components within normal limits  CBC - Abnormal; Notable for the following components:   RBC 3.98 (*)    MCH 34.9 (*)    All other components within normal limits  URINALYSIS, ROUTINE W REFLEX MICROSCOPIC  CBG MONITORING, ED    EKG EKG Interpretation  Date/Time:  Tuesday October 14 2017 15:18:06 EDT Ventricular Rate:  93 PR Interval:  132 QRS Duration: 70 QT Interval:  366 QTC Calculation: 455 R Axis:   -24 Text Interpretation:  Normal sinus rhythm Normal ECG rate slower otherwise  similar to previous Confirmed by Theotis Burrow [phone]) on 10/14/2017 9:14:17 PM   Radiology No results found.  Procedures .Critical Care Performed by: Franchot Heidelberg, PA-C Authorized by: Franchot Heidelberg, PA-C   Critical care provider statement:    Critical care time (minutes):  35   Critical care time was exclusive of:  Separately billable procedures and treating other patients and teaching time   Critical care was necessary to treat or prevent imminent or life-threatening deterioration of the following conditions:  Endocrine crisis   Critical care was time spent personally by me on the following activities:  Blood draw for specimens, development of treatment plan with patient or surrogate, discussions with consultants, evaluation of patient's response to treatment, obtaining history from patient or surrogate, ordering and performing treatments and interventions, ordering and review of laboratory studies, ordering and review of radiographic studies, pulse oximetry, re-evaluation of patient's condition and review of old charts   I assumed direction of critical care for this patient from another provider in my specialty: no   Comments:     Pt in DKA, insulin drip started   (including critical care time)  Medications Ordered in ED Medications  insulin regular (NOVOLIN R,HUMULIN R) 100 Units in sodium chloride 0.9 % 100 mL (1 Units/mL) infusion (has no administration in time range)  potassium chloride 10 mEq in 100 mL IVPB (has no administration in time range)     Initial Impression / Assessment and Plan / ED Course  I have reviewed the triage vital signs and the nursing notes.  Pertinent labs & imaging results that were available during my care of the patient were reviewed by me and considered in my medical decision making (see chart for details).     Presenting for evaluation of weakness.  Physical exam shows patient who appears dehydrated/dry but in no apparent distress.  Labs  concerning for DKA, as patient has elevated glucose, low bicarb and increased anion gap.  BUN increased from prior.  CBC reassuring, no leukocytosis, hemoglobin stable.  Potassium low at 3.2.  Will start IV fluids, potassium, and insulin.  Will order chest x-ray patient's history of aspiration.  Urine pending.  Pt signed out to Sharlene Motts, PA-C for  f/u on cxr and admission.  Final Clinical Impressions(s) / ED Diagnoses   Final diagnoses:  None    ED Discharge Orders    None       Franchot Heidelberg, PA-C 10/14/17 2235    Franchot Heidelberg, PA-C 10/14/17 2239    Little, Wenda Overland, MD 10/22/17 0730

## 2017-10-14 NOTE — ED Notes (Signed)
Pt back in room.

## 2017-10-14 NOTE — ED Triage Notes (Signed)
Pt states he has been tired and had a low appetite X1 week. Pt has also had crusting and drainage in both eyes X 1 week. Denies NVD. Denies any urinary symptoms.

## 2017-10-14 NOTE — ED Notes (Signed)
Assisted pt to restroom. Could not provide sample at this time.

## 2017-10-14 NOTE — ED Provider Notes (Signed)
One week h/o generalized weakness not feeling well Here in DKA, bil conjunc. H/o aspiration - CXR pending On Glucostabilizer  Getting fluids, K+,   Plan: admit to hospitalist  Labs reviewed: CBG 397 CO2 18 Anion gap 16  The patient appears very well. He has no symptoms. He is drinking fluids in the room without nausea. IV K+ running.   Discussed with Dr. Myna Hidalgo TRH. Decision made to give fluids in the ED and repeat labs as admission might be avoided. This plan is felt to be reasonable given the patient's well-appearing condition.   Repeat BMET shows improvement. Gap closed with reduction to 12. Still acidotic with CO2 18. Additional fluid provided. Patient updated on plan. Anticipate discharge home. He continues to take PO fluids. VSS.   CBG continues to stay in the low 200's. He is considered stable for discharge home. Will start on Metformin, continue potassium supplementation and encourage PCP follow up in 1-2 days.     Charlann Lange, PA-C 10/15/17 [id number]    Little, Wenda Overland, MD 10/21/17 [phone]

## 2017-10-14 NOTE — ED Notes (Signed)
Patient transported to X-ray 

## 2017-10-14 NOTE — ED Notes (Signed)
Pt given water 

## 2017-10-15 DIAGNOSIS — E876 Hypokalemia: Secondary | ICD-10-CM | POA: Diagnosis not present

## 2017-10-15 DIAGNOSIS — E1165 Type 2 diabetes mellitus with hyperglycemia: Secondary | ICD-10-CM | POA: Diagnosis not present

## 2017-10-15 DIAGNOSIS — Z87891 Personal history of nicotine dependence: Secondary | ICD-10-CM | POA: Diagnosis not present

## 2017-10-15 DIAGNOSIS — Z79899 Other long term (current) drug therapy: Secondary | ICD-10-CM | POA: Diagnosis not present

## 2017-10-15 DIAGNOSIS — I10 Essential (primary) hypertension: Secondary | ICD-10-CM | POA: Diagnosis not present

## 2017-10-15 LAB — CBG MONITORING, ED
GLUCOSE-CAPILLARY: 217 mg/dL — AB (ref 65–99)
GLUCOSE-CAPILLARY: 224 mg/dL — AB (ref 65–99)
Glucose-Capillary: 221 mg/dL — ABNORMAL HIGH (ref 65–99)

## 2017-10-15 LAB — BASIC METABOLIC PANEL
ANION GAP: 12 (ref 5–15)
BUN: 18 mg/dL (ref 6–20)
CALCIUM: 9.4 mg/dL (ref 8.9–10.3)
CO2: 18 mmol/L — ABNORMAL LOW (ref 22–32)
Chloride: 105 mmol/L (ref 101–111)
Creatinine, Ser: 0.86 mg/dL (ref 0.61–1.24)
GLUCOSE: 213 mg/dL — AB (ref 65–99)
POTASSIUM: 3.4 mmol/L — AB (ref 3.5–5.1)
Sodium: 135 mmol/L (ref 135–145)

## 2017-10-15 MED ORDER — POTASSIUM CHLORIDE CRYS ER 20 MEQ PO TBCR: 20.0000 meq | EXTENDED_RELEASE_TABLET | Freq: Two times a day (BID) | ORAL | 0 refills | Status: DC

## 2017-10-15 MED ORDER — METFORMIN HCL 500 MG PO TABS: 500.0000 mg | ORAL_TABLET | Freq: Two times a day (BID) | ORAL | 0 refills | Status: DC

## 2017-10-15 MED ORDER — SODIUM CHLORIDE 0.9 % IV BOLUS
1000.0000 mL | Freq: Once | INTRAVENOUS | Status: AC
  Administered 2017-10-15: 1000 mL via INTRAVENOUS

## 2017-10-15 NOTE — ED Notes (Signed)
PA Upstill notfied of pt's BS. PA notified this nurse to cancel the insulin via glucostabilizer and to not give the next two runs of potassium. No more orders at this time.

## 2017-10-21 DIAGNOSIS — M179 Osteoarthritis of knee, unspecified: Secondary | ICD-10-CM | POA: Diagnosis not present

## 2017-10-21 DIAGNOSIS — E1122 Type 2 diabetes mellitus with diabetic chronic kidney disease: Secondary | ICD-10-CM | POA: Diagnosis not present

## 2017-10-21 DIAGNOSIS — E876 Hypokalemia: Secondary | ICD-10-CM | POA: Diagnosis not present

## 2017-10-21 DIAGNOSIS — R69 Illness, unspecified: Secondary | ICD-10-CM | POA: Diagnosis not present

## 2017-10-21 DIAGNOSIS — R3915 Urgency of urination: Secondary | ICD-10-CM | POA: Diagnosis not present

## 2017-10-21 DIAGNOSIS — M19042 Primary osteoarthritis, left hand: Secondary | ICD-10-CM | POA: Diagnosis not present

## 2017-10-21 DIAGNOSIS — M169 Osteoarthritis of hip, unspecified: Secondary | ICD-10-CM | POA: Diagnosis not present

## 2017-10-21 DIAGNOSIS — E860 Dehydration: Secondary | ICD-10-CM | POA: Diagnosis not present

## 2017-10-21 DIAGNOSIS — R32 Unspecified urinary incontinence: Secondary | ICD-10-CM | POA: Diagnosis not present

## 2017-10-21 DIAGNOSIS — N400 Enlarged prostate without lower urinary tract symptoms: Secondary | ICD-10-CM | POA: Diagnosis not present

## 2017-10-21 DIAGNOSIS — I10 Essential (primary) hypertension: Secondary | ICD-10-CM | POA: Diagnosis not present

## 2017-10-27 DIAGNOSIS — R351 Nocturia: Secondary | ICD-10-CM | POA: Diagnosis not present

## 2017-10-27 DIAGNOSIS — R3911 Hesitancy of micturition: Secondary | ICD-10-CM | POA: Diagnosis not present

## 2017-11-24 DIAGNOSIS — R69 Illness, unspecified: Secondary | ICD-10-CM | POA: Diagnosis not present

## 2017-11-24 DIAGNOSIS — N400 Enlarged prostate without lower urinary tract symptoms: Secondary | ICD-10-CM | POA: Diagnosis not present

## 2017-11-24 DIAGNOSIS — R569 Unspecified convulsions: Secondary | ICD-10-CM | POA: Diagnosis not present

## 2017-11-24 DIAGNOSIS — K219 Gastro-esophageal reflux disease without esophagitis: Secondary | ICD-10-CM | POA: Diagnosis not present

## 2017-11-24 DIAGNOSIS — M169 Osteoarthritis of hip, unspecified: Secondary | ICD-10-CM | POA: Diagnosis not present

## 2017-11-24 DIAGNOSIS — R413 Other amnesia: Secondary | ICD-10-CM | POA: Diagnosis not present

## 2017-11-24 DIAGNOSIS — E876 Hypokalemia: Secondary | ICD-10-CM | POA: Diagnosis not present

## 2017-11-24 DIAGNOSIS — E1165 Type 2 diabetes mellitus with hyperglycemia: Secondary | ICD-10-CM | POA: Diagnosis not present

## 2017-11-24 DIAGNOSIS — I10 Essential (primary) hypertension: Secondary | ICD-10-CM | POA: Diagnosis not present

## 2017-11-26 DIAGNOSIS — I10 Essential (primary) hypertension: Secondary | ICD-10-CM | POA: Diagnosis not present

## 2017-11-26 DIAGNOSIS — R3915 Urgency of urination: Secondary | ICD-10-CM | POA: Diagnosis not present

## 2017-11-26 DIAGNOSIS — N400 Enlarged prostate without lower urinary tract symptoms: Secondary | ICD-10-CM | POA: Diagnosis not present

## 2017-11-26 DIAGNOSIS — R69 Illness, unspecified: Secondary | ICD-10-CM | POA: Diagnosis not present

## 2017-11-26 DIAGNOSIS — J309 Allergic rhinitis, unspecified: Secondary | ICD-10-CM | POA: Diagnosis not present

## 2017-11-26 DIAGNOSIS — M1990 Unspecified osteoarthritis, unspecified site: Secondary | ICD-10-CM | POA: Diagnosis not present

## 2017-11-26 DIAGNOSIS — E1122 Type 2 diabetes mellitus with diabetic chronic kidney disease: Secondary | ICD-10-CM | POA: Diagnosis not present

## 2017-11-26 DIAGNOSIS — E7800 Pure hypercholesterolemia, unspecified: Secondary | ICD-10-CM | POA: Diagnosis not present
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
## 2017-12-03 ENCOUNTER — Ambulatory Visit (INDEPENDENT_AMBULATORY_CARE_PROVIDER_SITE_OTHER): Payer: Medicare HMO | Admitting: Nurse Practitioner

## 2017-12-03 ENCOUNTER — Encounter: Payer: Self-pay | Admitting: Nurse Practitioner

## 2017-12-03 VITALS — BP 119/71 | HR 117 | Ht 62.0 in | Wt 134.0 lb

## 2017-12-03 DIAGNOSIS — R69 Illness, unspecified: Secondary | ICD-10-CM | POA: Diagnosis not present

## 2017-12-03 DIAGNOSIS — M179 Osteoarthritis of knee, unspecified: Secondary | ICD-10-CM | POA: Diagnosis not present

## 2017-12-03 DIAGNOSIS — E1165 Type 2 diabetes mellitus with hyperglycemia: Secondary | ICD-10-CM | POA: Diagnosis not present

## 2017-12-03 DIAGNOSIS — R569 Unspecified convulsions: Secondary | ICD-10-CM | POA: Diagnosis not present

## 2017-12-03 DIAGNOSIS — I10 Essential (primary) hypertension: Secondary | ICD-10-CM | POA: Diagnosis not present

## 2017-12-03 DIAGNOSIS — G40909 Epilepsy, unspecified, not intractable, without status epilepticus: Secondary | ICD-10-CM

## 2017-12-03 DIAGNOSIS — E119 Type 2 diabetes mellitus without complications: Secondary | ICD-10-CM | POA: Diagnosis not present

## 2017-12-03 DIAGNOSIS — E1122 Type 2 diabetes mellitus with diabetic chronic kidney disease: Secondary | ICD-10-CM | POA: Diagnosis not present

## 2017-12-03 DIAGNOSIS — E7800 Pure hypercholesterolemia, unspecified: Secondary | ICD-10-CM | POA: Diagnosis not present

## 2017-12-03 DIAGNOSIS — M19012 Primary osteoarthritis, left shoulder: Secondary | ICD-10-CM | POA: Diagnosis not present

## 2017-12-03 DIAGNOSIS — M19042 Primary osteoarthritis, left hand: Secondary | ICD-10-CM | POA: Diagnosis not present

## 2017-12-03 DIAGNOSIS — N400 Enlarged prostate without lower urinary tract symptoms: Secondary | ICD-10-CM | POA: Diagnosis not present

## 2017-12-03 MED ORDER — LEVETIRACETAM 500 MG PO TABS
500.0000 mg | ORAL_TABLET | Freq: Two times a day (BID) | ORAL | 3 refills | Status: DC
Start: 2017-12-03 — End: 2018-12-07

## 2017-12-03 NOTE — Progress Notes (Signed)
I have read the note, and I agree with the clinical assessment and plan.  Maurice Ramseur K Dorlis Judice   

## 2017-12-03 NOTE — Patient Instructions (Signed)
Continue Keppra 500 twice daily will refill for one year Call for any seizure activity Follow-up yearly and when necessary

## 2018-04-07 DIAGNOSIS — Z23 Encounter for immunization: Secondary | ICD-10-CM | POA: Diagnosis not present

## 2018-05-27 DIAGNOSIS — E1122 Type 2 diabetes mellitus with diabetic chronic kidney disease: Secondary | ICD-10-CM | POA: Diagnosis not present

## 2018-05-27 DIAGNOSIS — G40909 Epilepsy, unspecified, not intractable, without status epilepticus: Secondary | ICD-10-CM | POA: Diagnosis not present

## 2018-05-27 DIAGNOSIS — Z7289 Other problems related to lifestyle: Secondary | ICD-10-CM | POA: Diagnosis not present

## 2018-05-27 DIAGNOSIS — R69 Illness, unspecified: Secondary | ICD-10-CM | POA: Diagnosis not present

## 2018-05-27 DIAGNOSIS — I10 Essential (primary) hypertension: Secondary | ICD-10-CM | POA: Diagnosis not present

## 2018-05-27 DIAGNOSIS — N400 Enlarged prostate without lower urinary tract symptoms: Secondary | ICD-10-CM | POA: Diagnosis not present

## 2018-05-27 DIAGNOSIS — J309 Allergic rhinitis, unspecified: Secondary | ICD-10-CM | POA: Diagnosis not present

## 2018-05-29 DIAGNOSIS — J309 Allergic rhinitis, unspecified: Secondary | ICD-10-CM | POA: Diagnosis not present

## 2018-05-29 DIAGNOSIS — I10 Essential (primary) hypertension: Secondary | ICD-10-CM | POA: Diagnosis not present

## 2018-05-29 DIAGNOSIS — Z7289 Other problems related to lifestyle: Secondary | ICD-10-CM | POA: Diagnosis not present

## 2018-05-29 DIAGNOSIS — E1122 Type 2 diabetes mellitus with diabetic chronic kidney disease: Secondary | ICD-10-CM | POA: Diagnosis not present

## 2018-05-29 DIAGNOSIS — R69 Illness, unspecified: Secondary | ICD-10-CM | POA: Diagnosis not present

## 2018-05-29 DIAGNOSIS — N400 Enlarged prostate without lower urinary tract symptoms: Secondary | ICD-10-CM | POA: Diagnosis not present

## 2018-05-29 DIAGNOSIS — G40909 Epilepsy, unspecified, not intractable, without status epilepticus: Secondary | ICD-10-CM | POA: Diagnosis not present

## 2018-06-22 ENCOUNTER — Encounter: Payer: Self-pay | Admitting: Nurse Practitioner

## 2018-12-02 ENCOUNTER — Telehealth: Payer: Self-pay | Admitting: *Deleted

## 2018-12-02 NOTE — Telephone Encounter (Signed)
Due to current COVID 19 pandemic, our office is severely reducing in office visits until further notice, in order to minimize the risk to our patients and healthcare providers. Called busy signal.

## 2018-12-03 NOTE — Telephone Encounter (Signed)
Spoke to pt and he will do TELEPHONE VISIT. (not able to do VV).Due to current COVID 19 pandemic, our office is severely reducing in office visits until further notice, in order to minimize the risk to our patients and healthcare providers.  Pt understands that although there may be some limitations with this type of visit, we will take all precautions to reduce any security or privacy concerns.  Pt understands that this will be treated like an in office visit and we will file with pt's insurance, and there may be a patient responsible charge related to this service. Consented to telephone visit.
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
## 2018-12-07 ENCOUNTER — Ambulatory Visit: Payer: Medicare HMO | Admitting: Nurse Practitioner

## 2018-12-07 ENCOUNTER — Ambulatory Visit (INDEPENDENT_AMBULATORY_CARE_PROVIDER_SITE_OTHER): Payer: Medicare HMO | Admitting: Neurology

## 2018-12-07 ENCOUNTER — Other Ambulatory Visit: Payer: Self-pay

## 2018-12-07 ENCOUNTER — Encounter: Payer: Self-pay | Admitting: Neurology

## 2018-12-07 DIAGNOSIS — R569 Unspecified convulsions: Secondary | ICD-10-CM | POA: Diagnosis not present

## 2018-12-07 MED ORDER — LEVETIRACETAM 500 MG PO TABS: 500.0000 mg | ORAL_TABLET | Freq: Two times a day (BID) | ORAL | 3 refills | Status: DC

## 2018-12-07 NOTE — Progress Notes (Signed)
I have read the note, and I agree with the clinical assessment and plan.  Charles K Willis   

## 2018-12-19 DIAGNOSIS — R69 Illness, unspecified: Secondary | ICD-10-CM | POA: Diagnosis not present

## 2018-12-22 DIAGNOSIS — M1990 Unspecified osteoarthritis, unspecified site: Secondary | ICD-10-CM | POA: Diagnosis not present

## 2018-12-22 DIAGNOSIS — Z0001 Encounter for general adult medical examination with abnormal findings: Secondary | ICD-10-CM | POA: Diagnosis not present

## 2018-12-22 DIAGNOSIS — G40909 Epilepsy, unspecified, not intractable, without status epilepticus: Secondary | ICD-10-CM | POA: Diagnosis not present

## 2018-12-22 DIAGNOSIS — E7800 Pure hypercholesterolemia, unspecified: Secondary | ICD-10-CM | POA: Diagnosis not present

## 2018-12-22 DIAGNOSIS — E1165 Type 2 diabetes mellitus with hyperglycemia: Secondary | ICD-10-CM | POA: Diagnosis not present

## 2018-12-22 DIAGNOSIS — E119 Type 2 diabetes mellitus without complications: Secondary | ICD-10-CM | POA: Diagnosis not present

## 2018-12-22 DIAGNOSIS — I10 Essential (primary) hypertension: Secondary | ICD-10-CM | POA: Diagnosis not present

## 2018-12-22 DIAGNOSIS — M19042 Primary osteoarthritis, left hand: Secondary | ICD-10-CM | POA: Diagnosis not present

## 2018-12-22 DIAGNOSIS — M169 Osteoarthritis of hip, unspecified: Secondary | ICD-10-CM | POA: Diagnosis not present

## 2018-12-22 DIAGNOSIS — R69 Illness, unspecified: Secondary | ICD-10-CM | POA: Diagnosis not present

## 2018-12-22 DIAGNOSIS — K219 Gastro-esophageal reflux disease without esophagitis: Secondary | ICD-10-CM | POA: Diagnosis not present

## 2018-12-22 DIAGNOSIS — M179 Osteoarthritis of knee, unspecified: Secondary | ICD-10-CM | POA: Diagnosis not present

## 2018-12-22 DIAGNOSIS — J309 Allergic rhinitis, unspecified: Secondary | ICD-10-CM | POA: Diagnosis not present

## 2018-12-22 DIAGNOSIS — N400 Enlarged prostate without lower urinary tract symptoms: Secondary | ICD-10-CM | POA: Diagnosis not present

## 2018-12-22 DIAGNOSIS — Z1389 Encounter for screening for other disorder: Secondary | ICD-10-CM | POA: Diagnosis not present

## 2018-12-22 DIAGNOSIS — E1122 Type 2 diabetes mellitus with diabetic chronic kidney disease: Secondary | ICD-10-CM | POA: Diagnosis not present

## 2018-12-31 DIAGNOSIS — I10 Essential (primary) hypertension: Secondary | ICD-10-CM | POA: Diagnosis not present

## 2018-12-31 DIAGNOSIS — E7800 Pure hypercholesterolemia, unspecified: Secondary | ICD-10-CM | POA: Diagnosis not present

## 2018-12-31 DIAGNOSIS — E559 Vitamin D deficiency, unspecified: Secondary | ICD-10-CM | POA: Diagnosis not present

## 2018-12-31 DIAGNOSIS — R413 Other amnesia: Secondary | ICD-10-CM | POA: Diagnosis not present

## 2018-12-31 DIAGNOSIS — E1165 Type 2 diabetes mellitus with hyperglycemia: Secondary | ICD-10-CM | POA: Diagnosis not present

## 2019-01-21 DIAGNOSIS — E1122 Type 2 diabetes mellitus with diabetic chronic kidney disease: Secondary | ICD-10-CM | POA: Diagnosis not present

## 2019-01-21 DIAGNOSIS — M179 Osteoarthritis of knee, unspecified: Secondary | ICD-10-CM | POA: Diagnosis not present

## 2019-01-21 DIAGNOSIS — N400 Enlarged prostate without lower urinary tract symptoms: Secondary | ICD-10-CM | POA: Diagnosis not present

## 2019-01-21 DIAGNOSIS — M19042 Primary osteoarthritis, left hand: Secondary | ICD-10-CM | POA: Diagnosis not present

## 2019-01-21 DIAGNOSIS — M169 Osteoarthritis of hip, unspecified: Secondary | ICD-10-CM | POA: Diagnosis not present

## 2019-01-21 DIAGNOSIS — E1165 Type 2 diabetes mellitus with hyperglycemia: Secondary | ICD-10-CM | POA: Diagnosis not present

## 2019-01-21 DIAGNOSIS — I10 Essential (primary) hypertension: Secondary | ICD-10-CM | POA: Diagnosis not present

## 2019-01-21 DIAGNOSIS — R69 Illness, unspecified: Secondary | ICD-10-CM | POA: Diagnosis not present

## 2019-01-21 DIAGNOSIS — M1990 Unspecified osteoarthritis, unspecified site: Secondary | ICD-10-CM | POA: Diagnosis not present

## 2019-01-21 DIAGNOSIS — E119 Type 2 diabetes mellitus without complications: Secondary | ICD-10-CM | POA: Diagnosis not present

## 2019-01-26 DIAGNOSIS — R351 Nocturia: Secondary | ICD-10-CM | POA: Diagnosis not present

## 2019-01-26 DIAGNOSIS — R350 Frequency of micturition: Secondary | ICD-10-CM | POA: Diagnosis not present

## 2019-02-02 DIAGNOSIS — I10 Essential (primary) hypertension: Secondary | ICD-10-CM | POA: Diagnosis not present

## 2019-02-19 DIAGNOSIS — R69 Illness, unspecified: Secondary | ICD-10-CM | POA: Diagnosis not present

## 2019-02-19 DIAGNOSIS — E1165 Type 2 diabetes mellitus with hyperglycemia: Secondary | ICD-10-CM | POA: Diagnosis not present

## 2019-02-19 DIAGNOSIS — E1122 Type 2 diabetes mellitus with diabetic chronic kidney disease: Secondary | ICD-10-CM | POA: Diagnosis not present

## 2019-02-19 DIAGNOSIS — N400 Enlarged prostate without lower urinary tract symptoms: Secondary | ICD-10-CM | POA: Diagnosis not present

## 2019-02-19 DIAGNOSIS — M169 Osteoarthritis of hip, unspecified: Secondary | ICD-10-CM | POA: Diagnosis not present

## 2019-02-19 DIAGNOSIS — M19042 Primary osteoarthritis, left hand: Secondary | ICD-10-CM | POA: Diagnosis not present

## 2019-02-19 DIAGNOSIS — M1990 Unspecified osteoarthritis, unspecified site: Secondary | ICD-10-CM | POA: Diagnosis not present

## 2019-02-19 DIAGNOSIS — I10 Essential (primary) hypertension: Secondary | ICD-10-CM | POA: Diagnosis not present

## 2019-02-19 DIAGNOSIS — M179 Osteoarthritis of knee, unspecified: Secondary | ICD-10-CM | POA: Diagnosis not present

## 2019-02-19 DIAGNOSIS — E119 Type 2 diabetes mellitus without complications: Secondary | ICD-10-CM | POA: Diagnosis not present

## 2019-02-26 DIAGNOSIS — E1165 Type 2 diabetes mellitus with hyperglycemia: Secondary | ICD-10-CM | POA: Diagnosis not present

## 2019-02-26 DIAGNOSIS — M1990 Unspecified osteoarthritis, unspecified site: Secondary | ICD-10-CM | POA: Diagnosis not present

## 2019-02-26 DIAGNOSIS — R69 Illness, unspecified: Secondary | ICD-10-CM | POA: Diagnosis not present

## 2019-02-26 DIAGNOSIS — N400 Enlarged prostate without lower urinary tract symptoms: Secondary | ICD-10-CM | POA: Diagnosis not present

## 2019-02-26 DIAGNOSIS — E1122 Type 2 diabetes mellitus with diabetic chronic kidney disease: Secondary | ICD-10-CM | POA: Diagnosis not present

## 2019-02-26 DIAGNOSIS — M19042 Primary osteoarthritis, left hand: Secondary | ICD-10-CM | POA: Diagnosis not present

## 2019-02-26 DIAGNOSIS — I10 Essential (primary) hypertension: Secondary | ICD-10-CM | POA: Diagnosis not present

## 2019-02-26 DIAGNOSIS — M169 Osteoarthritis of hip, unspecified: Secondary | ICD-10-CM | POA: Diagnosis not present

## 2019-02-26 DIAGNOSIS — M179 Osteoarthritis of knee, unspecified: Secondary | ICD-10-CM | POA: Diagnosis not present

## 2019-02-26 DIAGNOSIS — E119 Type 2 diabetes mellitus without complications: Secondary | ICD-10-CM | POA: Diagnosis not present

## 2019-04-08 DIAGNOSIS — Z23 Encounter for immunization: Secondary | ICD-10-CM | POA: Diagnosis not present

## 2019-04-19 ENCOUNTER — Telehealth: Payer: Self-pay | Admitting: *Deleted

## 2019-04-19 DIAGNOSIS — M1990 Unspecified osteoarthritis, unspecified site: Secondary | ICD-10-CM | POA: Diagnosis not present

## 2019-04-19 DIAGNOSIS — M19042 Primary osteoarthritis, left hand: Secondary | ICD-10-CM | POA: Diagnosis not present

## 2019-04-19 DIAGNOSIS — M179 Osteoarthritis of knee, unspecified: Secondary | ICD-10-CM | POA: Diagnosis not present

## 2019-04-19 DIAGNOSIS — R69 Illness, unspecified: Secondary | ICD-10-CM | POA: Diagnosis not present

## 2019-04-19 DIAGNOSIS — E1122 Type 2 diabetes mellitus with diabetic chronic kidney disease: Secondary | ICD-10-CM | POA: Diagnosis not present

## 2019-04-19 DIAGNOSIS — E1165 Type 2 diabetes mellitus with hyperglycemia: Secondary | ICD-10-CM | POA: Diagnosis not present

## 2019-04-19 DIAGNOSIS — I10 Essential (primary) hypertension: Secondary | ICD-10-CM | POA: Diagnosis not present

## 2019-04-19 DIAGNOSIS — E119 Type 2 diabetes mellitus without complications: Secondary | ICD-10-CM | POA: Diagnosis not present

## 2019-04-19 DIAGNOSIS — N400 Enlarged prostate without lower urinary tract symptoms: Secondary | ICD-10-CM | POA: Diagnosis not present

## 2019-04-19 DIAGNOSIS — M169 Osteoarthritis of hip, unspecified: Secondary | ICD-10-CM | POA: Diagnosis not present

## 2019-04-19 MED ORDER — LEVETIRACETAM 500 MG PO TABS: 500.0000 mg | ORAL_TABLET | Freq: Two times a day (BID) | ORAL | 1 refills | Status: DC

## 2019-04-19 NOTE — Telephone Encounter (Signed)
[facility].  Asking for escript to keppra.

## 2019-04-27 DIAGNOSIS — R69 Illness, unspecified: Secondary | ICD-10-CM | POA: Diagnosis not present

## 2019-05-19 DIAGNOSIS — N400 Enlarged prostate without lower urinary tract symptoms: Secondary | ICD-10-CM | POA: Diagnosis not present

## 2019-05-19 DIAGNOSIS — M19042 Primary osteoarthritis, left hand: Secondary | ICD-10-CM | POA: Diagnosis not present

## 2019-05-19 DIAGNOSIS — M179 Osteoarthritis of knee, unspecified: Secondary | ICD-10-CM | POA: Diagnosis not present

## 2019-05-19 DIAGNOSIS — E119 Type 2 diabetes mellitus without complications: Secondary | ICD-10-CM | POA: Diagnosis not present

## 2019-05-19 DIAGNOSIS — E1165 Type 2 diabetes mellitus with hyperglycemia: Secondary | ICD-10-CM | POA: Diagnosis not present

## 2019-05-19 DIAGNOSIS — I10 Essential (primary) hypertension: Secondary | ICD-10-CM | POA: Diagnosis not present

## 2019-05-19 DIAGNOSIS — M1990 Unspecified osteoarthritis, unspecified site: Secondary | ICD-10-CM | POA: Diagnosis not present

## 2019-05-19 DIAGNOSIS — R69 Illness, unspecified: Secondary | ICD-10-CM | POA: Diagnosis not present

## 2019-05-19 DIAGNOSIS — M169 Osteoarthritis of hip, unspecified: Secondary | ICD-10-CM | POA: Diagnosis not present

## 2019-05-19 DIAGNOSIS — E1122 Type 2 diabetes mellitus with diabetic chronic kidney disease: Secondary | ICD-10-CM | POA: Diagnosis not present

## 2019-06-16 DIAGNOSIS — R69 Illness, unspecified: Secondary | ICD-10-CM | POA: Diagnosis not present

## 2019-06-16 DIAGNOSIS — E119 Type 2 diabetes mellitus without complications: Secondary | ICD-10-CM | POA: Diagnosis not present

## 2019-06-16 DIAGNOSIS — E1122 Type 2 diabetes mellitus with diabetic chronic kidney disease: Secondary | ICD-10-CM | POA: Diagnosis not present

## 2019-06-16 DIAGNOSIS — M179 Osteoarthritis of knee, unspecified: Secondary | ICD-10-CM | POA: Diagnosis not present

## 2019-06-16 DIAGNOSIS — M1990 Unspecified osteoarthritis, unspecified site: Secondary | ICD-10-CM | POA: Diagnosis not present

## 2019-06-16 DIAGNOSIS — M169 Osteoarthritis of hip, unspecified: Secondary | ICD-10-CM | POA: Diagnosis not present

## 2019-06-16 DIAGNOSIS — M19042 Primary osteoarthritis, left hand: Secondary | ICD-10-CM | POA: Diagnosis not present

## 2019-06-16 DIAGNOSIS — E1165 Type 2 diabetes mellitus with hyperglycemia: Secondary | ICD-10-CM | POA: Diagnosis not present

## 2019-06-16 DIAGNOSIS — N400 Enlarged prostate without lower urinary tract symptoms: Secondary | ICD-10-CM | POA: Diagnosis not present

## 2019-06-16 DIAGNOSIS — I10 Essential (primary) hypertension: Secondary | ICD-10-CM | POA: Diagnosis not present
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
## 2019-06-30 ENCOUNTER — Ambulatory Visit: Payer: Medicare HMO | Admitting: Neurology

## 2019-06-30 ENCOUNTER — Encounter: Payer: Self-pay | Admitting: Neurology

## 2019-06-30 ENCOUNTER — Other Ambulatory Visit: Payer: Self-pay

## 2019-06-30 VITALS — BP 104/65 | HR 104 | Temp 97.5°F | Ht 62.0 in | Wt 122.6 lb

## 2019-06-30 DIAGNOSIS — R569 Unspecified convulsions: Secondary | ICD-10-CM | POA: Diagnosis not present

## 2019-06-30 MED ORDER — LEVETIRACETAM 500 MG PO TABS: 500.0000 mg | ORAL_TABLET | Freq: Two times a day (BID) | ORAL | 3 refills | Status: DC

## 2019-06-30 NOTE — Patient Instructions (Signed)
Continue Keppra, no changes today. Return in 1 year, call for seizure

## 2019-07-03 NOTE — Progress Notes (Signed)
I have read the note, and I agree with the clinical assessment and plan.  Gita Dilger K Sury Wentworth   

## 2019-07-03 NOTE — Progress Notes (Signed)
I have read the note, and I agree with the clinical assessment and plan.  Murl Zogg K Amarie Tarte   

## 2019-07-19 DIAGNOSIS — R69 Illness, unspecified: Secondary | ICD-10-CM | POA: Diagnosis not present

## 2019-07-19 DIAGNOSIS — M169 Osteoarthritis of hip, unspecified: Secondary | ICD-10-CM | POA: Diagnosis not present

## 2019-07-19 DIAGNOSIS — E1122 Type 2 diabetes mellitus with diabetic chronic kidney disease: Secondary | ICD-10-CM | POA: Diagnosis not present

## 2019-07-19 DIAGNOSIS — E119 Type 2 diabetes mellitus without complications: Secondary | ICD-10-CM | POA: Diagnosis not present

## 2019-07-19 DIAGNOSIS — M1990 Unspecified osteoarthritis, unspecified site: Secondary | ICD-10-CM | POA: Diagnosis not present

## 2019-07-19 DIAGNOSIS — N400 Enlarged prostate without lower urinary tract symptoms: Secondary | ICD-10-CM | POA: Diagnosis not present

## 2019-07-19 DIAGNOSIS — M19042 Primary osteoarthritis, left hand: Secondary | ICD-10-CM | POA: Diagnosis not present

## 2019-07-19 DIAGNOSIS — I10 Essential (primary) hypertension: Secondary | ICD-10-CM | POA: Diagnosis not present

## 2019-07-19 DIAGNOSIS — M179 Osteoarthritis of knee, unspecified: Secondary | ICD-10-CM | POA: Diagnosis not present

## 2019-07-19 DIAGNOSIS — E1165 Type 2 diabetes mellitus with hyperglycemia: Secondary | ICD-10-CM | POA: Diagnosis not present

## 2019-08-05 DIAGNOSIS — M1990 Unspecified osteoarthritis, unspecified site: Secondary | ICD-10-CM | POA: Diagnosis not present

## 2019-08-05 DIAGNOSIS — R69 Illness, unspecified: Secondary | ICD-10-CM | POA: Diagnosis not present

## 2019-08-05 DIAGNOSIS — D649 Anemia, unspecified: Secondary | ICD-10-CM | POA: Diagnosis not present

## 2019-08-05 DIAGNOSIS — G8929 Other chronic pain: Secondary | ICD-10-CM | POA: Diagnosis not present

## 2019-08-05 DIAGNOSIS — R569 Unspecified convulsions: Secondary | ICD-10-CM | POA: Diagnosis not present

## 2019-08-05 DIAGNOSIS — E119 Type 2 diabetes mellitus without complications: Secondary | ICD-10-CM | POA: Diagnosis not present

## 2019-08-05 DIAGNOSIS — I10 Essential (primary) hypertension: Secondary | ICD-10-CM | POA: Diagnosis not present

## 2019-08-05 DIAGNOSIS — N400 Enlarged prostate without lower urinary tract symptoms: Secondary | ICD-10-CM | POA: Diagnosis not present

## 2019-08-05 DIAGNOSIS — K219 Gastro-esophageal reflux disease without esophagitis: Secondary | ICD-10-CM | POA: Diagnosis not present

## 2019-08-05 DIAGNOSIS — J309 Allergic rhinitis, unspecified: Secondary | ICD-10-CM | POA: Diagnosis not present

## 2019-08-19 DIAGNOSIS — M179 Osteoarthritis of knee, unspecified: Secondary | ICD-10-CM | POA: Diagnosis not present

## 2019-08-19 DIAGNOSIS — N400 Enlarged prostate without lower urinary tract symptoms: Secondary | ICD-10-CM | POA: Diagnosis not present

## 2019-08-19 DIAGNOSIS — E1122 Type 2 diabetes mellitus with diabetic chronic kidney disease: Secondary | ICD-10-CM | POA: Diagnosis not present

## 2019-08-19 DIAGNOSIS — R69 Illness, unspecified: Secondary | ICD-10-CM | POA: Diagnosis not present

## 2019-08-19 DIAGNOSIS — I10 Essential (primary) hypertension: Secondary | ICD-10-CM | POA: Diagnosis not present

## 2019-08-19 DIAGNOSIS — E7800 Pure hypercholesterolemia, unspecified: Secondary | ICD-10-CM | POA: Diagnosis not present

## 2019-08-19 DIAGNOSIS — M169 Osteoarthritis of hip, unspecified: Secondary | ICD-10-CM | POA: Diagnosis not present

## 2019-08-19 DIAGNOSIS — M19042 Primary osteoarthritis, left hand: Secondary | ICD-10-CM | POA: Diagnosis not present

## 2019-08-19 DIAGNOSIS — E119 Type 2 diabetes mellitus without complications: Secondary | ICD-10-CM | POA: Diagnosis not present

## 2019-08-19 DIAGNOSIS — E1165 Type 2 diabetes mellitus with hyperglycemia: Secondary | ICD-10-CM | POA: Diagnosis not present

## 2019-09-21 DIAGNOSIS — E1122 Type 2 diabetes mellitus with diabetic chronic kidney disease: Secondary | ICD-10-CM | POA: Diagnosis not present

## 2019-09-21 DIAGNOSIS — M179 Osteoarthritis of knee, unspecified: Secondary | ICD-10-CM | POA: Diagnosis not present

## 2019-09-21 DIAGNOSIS — R69 Illness, unspecified: Secondary | ICD-10-CM | POA: Diagnosis not present

## 2019-09-21 DIAGNOSIS — M19042 Primary osteoarthritis, left hand: Secondary | ICD-10-CM | POA: Diagnosis not present

## 2019-09-21 DIAGNOSIS — I10 Essential (primary) hypertension: Secondary | ICD-10-CM | POA: Diagnosis not present

## 2019-09-21 DIAGNOSIS — N400 Enlarged prostate without lower urinary tract symptoms: Secondary | ICD-10-CM | POA: Diagnosis not present

## 2019-09-21 DIAGNOSIS — E119 Type 2 diabetes mellitus without complications: Secondary | ICD-10-CM | POA: Diagnosis not present

## 2019-09-21 DIAGNOSIS — M169 Osteoarthritis of hip, unspecified: Secondary | ICD-10-CM | POA: Diagnosis not present

## 2019-09-21 DIAGNOSIS — M1990 Unspecified osteoarthritis, unspecified site: Secondary | ICD-10-CM | POA: Diagnosis not present

## 2019-09-21 DIAGNOSIS — E1165 Type 2 diabetes mellitus with hyperglycemia: Secondary | ICD-10-CM | POA: Diagnosis not present

## 2019-11-19 DIAGNOSIS — R69 Illness, unspecified: Secondary | ICD-10-CM | POA: Diagnosis not present

## 2019-11-19 DIAGNOSIS — N400 Enlarged prostate without lower urinary tract symptoms: Secondary | ICD-10-CM | POA: Diagnosis not present

## 2019-11-19 DIAGNOSIS — E119 Type 2 diabetes mellitus without complications: Secondary | ICD-10-CM | POA: Diagnosis not present

## 2019-11-19 DIAGNOSIS — E1122 Type 2 diabetes mellitus with diabetic chronic kidney disease: Secondary | ICD-10-CM | POA: Diagnosis not present

## 2019-11-19 DIAGNOSIS — M19042 Primary osteoarthritis, left hand: Secondary | ICD-10-CM | POA: Diagnosis not present

## 2019-11-19 DIAGNOSIS — M169 Osteoarthritis of hip, unspecified: Secondary | ICD-10-CM | POA: Diagnosis not present

## 2019-11-19 DIAGNOSIS — M1990 Unspecified osteoarthritis, unspecified site: Secondary | ICD-10-CM | POA: Diagnosis not present

## 2019-11-19 DIAGNOSIS — I10 Essential (primary) hypertension: Secondary | ICD-10-CM | POA: Diagnosis not present

## 2019-11-19 DIAGNOSIS — E7800 Pure hypercholesterolemia, unspecified: Secondary | ICD-10-CM | POA: Diagnosis not present

## 2019-11-19 DIAGNOSIS — E1165 Type 2 diabetes mellitus with hyperglycemia: Secondary | ICD-10-CM | POA: Diagnosis not present

## 2019-12-02 DIAGNOSIS — M19012 Primary osteoarthritis, left shoulder: Secondary | ICD-10-CM | POA: Diagnosis not present

## 2019-12-02 DIAGNOSIS — E1165 Type 2 diabetes mellitus with hyperglycemia: Secondary | ICD-10-CM | POA: Diagnosis not present

## 2019-12-02 DIAGNOSIS — E7800 Pure hypercholesterolemia, unspecified: Secondary | ICD-10-CM | POA: Diagnosis not present

## 2019-12-02 DIAGNOSIS — M19042 Primary osteoarthritis, left hand: Secondary | ICD-10-CM | POA: Diagnosis not present

## 2019-12-02 DIAGNOSIS — I10 Essential (primary) hypertension: Secondary | ICD-10-CM | POA: Diagnosis not present

## 2019-12-02 DIAGNOSIS — M1990 Unspecified osteoarthritis, unspecified site: Secondary | ICD-10-CM | POA: Diagnosis not present

## 2019-12-02 DIAGNOSIS — R69 Illness, unspecified: Secondary | ICD-10-CM | POA: Diagnosis not present

## 2019-12-02 DIAGNOSIS — M169 Osteoarthritis of hip, unspecified: Secondary | ICD-10-CM | POA: Diagnosis not present

## 2019-12-02 DIAGNOSIS — N400 Enlarged prostate without lower urinary tract symptoms: Secondary | ICD-10-CM | POA: Diagnosis not present

## 2020-01-18 DIAGNOSIS — N400 Enlarged prostate without lower urinary tract symptoms: Secondary | ICD-10-CM | POA: Diagnosis not present

## 2020-01-18 DIAGNOSIS — M549 Dorsalgia, unspecified: Secondary | ICD-10-CM | POA: Diagnosis not present

## 2020-01-18 DIAGNOSIS — E1165 Type 2 diabetes mellitus with hyperglycemia: Secondary | ICD-10-CM | POA: Diagnosis not present

## 2020-01-18 DIAGNOSIS — E1122 Type 2 diabetes mellitus with diabetic chronic kidney disease: Secondary | ICD-10-CM | POA: Diagnosis not present

## 2020-01-18 DIAGNOSIS — M1990 Unspecified osteoarthritis, unspecified site: Secondary | ICD-10-CM | POA: Diagnosis not present

## 2020-01-18 DIAGNOSIS — Z1389 Encounter for screening for other disorder: Secondary | ICD-10-CM | POA: Diagnosis not present

## 2020-01-18 DIAGNOSIS — I10 Essential (primary) hypertension: Secondary | ICD-10-CM | POA: Diagnosis not present

## 2020-01-18 DIAGNOSIS — Z0000 Encounter for general adult medical examination without abnormal findings: Secondary | ICD-10-CM | POA: Diagnosis not present

## 2020-01-18 DIAGNOSIS — R634 Abnormal weight loss: Secondary | ICD-10-CM | POA: Diagnosis not present

## 2020-01-18 DIAGNOSIS — R196 Halitosis: Secondary | ICD-10-CM | POA: Diagnosis not present

## 2020-01-18 DIAGNOSIS — R69 Illness, unspecified: Secondary | ICD-10-CM | POA: Diagnosis not present

## 2020-01-18 DIAGNOSIS — E7800 Pure hypercholesterolemia, unspecified: Secondary | ICD-10-CM | POA: Diagnosis not present

## 2020-01-20 ENCOUNTER — Other Ambulatory Visit: Payer: Self-pay | Admitting: Internal Medicine

## 2020-01-20 DIAGNOSIS — R634 Abnormal weight loss: Secondary | ICD-10-CM

## 2020-01-20 DIAGNOSIS — R978 Other abnormal tumor markers: Secondary | ICD-10-CM

## 2020-01-20 DIAGNOSIS — R899 Unspecified abnormal finding in specimens from other organs, systems and tissues: Secondary | ICD-10-CM

## 2020-01-20 DIAGNOSIS — R319 Hematuria, unspecified: Secondary | ICD-10-CM

## 2020-01-27 DIAGNOSIS — R978 Other abnormal tumor markers: Secondary | ICD-10-CM | POA: Diagnosis not present

## 2020-01-27 DIAGNOSIS — R634 Abnormal weight loss: Secondary | ICD-10-CM | POA: Diagnosis not present

## 2020-01-27 DIAGNOSIS — R3911 Hesitancy of micturition: Secondary | ICD-10-CM | POA: Diagnosis not present

## 2020-01-27 DIAGNOSIS — R350 Frequency of micturition: Secondary | ICD-10-CM | POA: Diagnosis not present

## 2020-01-27 DIAGNOSIS — N390 Urinary tract infection, site not specified: Secondary | ICD-10-CM | POA: Diagnosis not present

## 2020-01-27 DIAGNOSIS — N401 Enlarged prostate with lower urinary tract symptoms: Secondary | ICD-10-CM | POA: Diagnosis not present

## 2020-01-28 ENCOUNTER — Inpatient Hospital Stay: Admission: RE | Admit: 2020-01-28 | Payer: Medicare HMO | Source: Ambulatory Visit

## 2020-01-28 ENCOUNTER — Ambulatory Visit
Admission: RE | Admit: 2020-01-28 | Discharge: 2020-01-28 | Disposition: A | Discharge status: Home / Self Care | Payer: Medicare HMO | Source: Ambulatory Visit | Attending: Internal Medicine | Admitting: Internal Medicine

## 2020-01-28 DIAGNOSIS — N200 Calculus of kidney: Secondary | ICD-10-CM | POA: Diagnosis not present

## 2020-01-28 DIAGNOSIS — K3189 Other diseases of stomach and duodenum: Secondary | ICD-10-CM | POA: Diagnosis not present

## 2020-01-28 DIAGNOSIS — R319 Hematuria, unspecified: Secondary | ICD-10-CM

## 2020-01-28 DIAGNOSIS — R978 Other abnormal tumor markers: Secondary | ICD-10-CM

## 2020-01-28 DIAGNOSIS — R634 Abnormal weight loss: Secondary | ICD-10-CM

## 2020-01-28 DIAGNOSIS — K8020 Calculus of gallbladder without cholecystitis without obstruction: Secondary | ICD-10-CM | POA: Diagnosis not present

## 2020-01-28 DIAGNOSIS — K5730 Diverticulosis of large intestine without perforation or abscess without bleeding: Secondary | ICD-10-CM | POA: Diagnosis not present

## 2020-01-28 DIAGNOSIS — R899 Unspecified abnormal finding in specimens from other organs, systems and tissues: Secondary | ICD-10-CM

## 2020-01-28 MED ORDER — IOPAMIDOL (ISOVUE-300) INJECTION 61%
100.0000 mL | Freq: Once | INTRAVENOUS | Status: AC | PRN
  Administered 2020-01-28: 100 mL via INTRAVENOUS

## 2020-02-21 DIAGNOSIS — M19012 Primary osteoarthritis, left shoulder: Secondary | ICD-10-CM | POA: Diagnosis not present

## 2020-02-21 DIAGNOSIS — M1990 Unspecified osteoarthritis, unspecified site: Secondary | ICD-10-CM | POA: Diagnosis not present

## 2020-02-21 DIAGNOSIS — I10 Essential (primary) hypertension: Secondary | ICD-10-CM | POA: Diagnosis not present

## 2020-02-21 DIAGNOSIS — E1165 Type 2 diabetes mellitus with hyperglycemia: Secondary | ICD-10-CM | POA: Diagnosis not present

## 2020-02-21 DIAGNOSIS — R69 Illness, unspecified: Secondary | ICD-10-CM | POA: Diagnosis not present

## 2020-02-21 DIAGNOSIS — E7800 Pure hypercholesterolemia, unspecified: Secondary | ICD-10-CM | POA: Diagnosis not present

## 2020-02-21 DIAGNOSIS — M19042 Primary osteoarthritis, left hand: Secondary | ICD-10-CM | POA: Diagnosis not present

## 2020-02-21 DIAGNOSIS — M169 Osteoarthritis of hip, unspecified: Secondary | ICD-10-CM | POA: Diagnosis not present

## 2020-02-21 DIAGNOSIS — M179 Osteoarthritis of knee, unspecified: Secondary | ICD-10-CM | POA: Diagnosis not present

## 2020-02-21 DIAGNOSIS — N400 Enlarged prostate without lower urinary tract symptoms: Secondary | ICD-10-CM | POA: Diagnosis not present

## 2020-04-19 DIAGNOSIS — M169 Osteoarthritis of hip, unspecified: Secondary | ICD-10-CM | POA: Diagnosis not present

## 2020-04-19 DIAGNOSIS — I10 Essential (primary) hypertension: Secondary | ICD-10-CM | POA: Diagnosis not present

## 2020-04-19 DIAGNOSIS — M19012 Primary osteoarthritis, left shoulder: Secondary | ICD-10-CM | POA: Diagnosis not present

## 2020-04-19 DIAGNOSIS — E1165 Type 2 diabetes mellitus with hyperglycemia: Secondary | ICD-10-CM | POA: Diagnosis not present

## 2020-04-19 DIAGNOSIS — M179 Osteoarthritis of knee, unspecified: Secondary | ICD-10-CM | POA: Diagnosis not present

## 2020-04-19 DIAGNOSIS — E119 Type 2 diabetes mellitus without complications: Secondary | ICD-10-CM | POA: Diagnosis not present

## 2020-04-19 DIAGNOSIS — E7800 Pure hypercholesterolemia, unspecified: Secondary | ICD-10-CM | POA: Diagnosis not present

## 2020-04-19 DIAGNOSIS — N400 Enlarged prostate without lower urinary tract symptoms: Secondary | ICD-10-CM | POA: Diagnosis not present

## 2020-04-19 DIAGNOSIS — R69 Illness, unspecified: Secondary | ICD-10-CM | POA: Diagnosis not present

## 2020-04-19 DIAGNOSIS — M19042 Primary osteoarthritis, left hand: Secondary | ICD-10-CM | POA: Diagnosis not present

## 2020-04-20 DIAGNOSIS — Z23 Encounter for immunization: Secondary | ICD-10-CM | POA: Diagnosis not present

## 2020-06-07 DIAGNOSIS — K219 Gastro-esophageal reflux disease without esophagitis: Secondary | ICD-10-CM | POA: Diagnosis not present

## 2020-06-07 DIAGNOSIS — I10 Essential (primary) hypertension: Secondary | ICD-10-CM | POA: Diagnosis not present

## 2020-06-07 DIAGNOSIS — R69 Illness, unspecified: Secondary | ICD-10-CM | POA: Diagnosis not present

## 2020-06-07 DIAGNOSIS — E1122 Type 2 diabetes mellitus with diabetic chronic kidney disease: Secondary | ICD-10-CM | POA: Diagnosis not present

## 2020-06-07 DIAGNOSIS — M19012 Primary osteoarthritis, left shoulder: Secondary | ICD-10-CM | POA: Diagnosis not present

## 2020-06-07 DIAGNOSIS — E1165 Type 2 diabetes mellitus with hyperglycemia: Secondary | ICD-10-CM | POA: Diagnosis not present

## 2020-06-07 DIAGNOSIS — E119 Type 2 diabetes mellitus without complications: Secondary | ICD-10-CM | POA: Diagnosis not present

## 2020-06-07 DIAGNOSIS — M1990 Unspecified osteoarthritis, unspecified site: Secondary | ICD-10-CM | POA: Diagnosis not present

## 2020-06-07 DIAGNOSIS — M179 Osteoarthritis of knee, unspecified: Secondary | ICD-10-CM | POA: Diagnosis not present

## 2020-06-07 DIAGNOSIS — E7800 Pure hypercholesterolemia, unspecified: Secondary | ICD-10-CM | POA: Diagnosis not present

## 2020-06-22 DIAGNOSIS — K219 Gastro-esophageal reflux disease without esophagitis: Secondary | ICD-10-CM | POA: Diagnosis not present

## 2020-06-22 DIAGNOSIS — M179 Osteoarthritis of knee, unspecified: Secondary | ICD-10-CM | POA: Diagnosis not present

## 2020-06-22 DIAGNOSIS — J309 Allergic rhinitis, unspecified: Secondary | ICD-10-CM | POA: Diagnosis not present

## 2020-06-22 DIAGNOSIS — G40909 Epilepsy, unspecified, not intractable, without status epilepticus: Secondary | ICD-10-CM | POA: Diagnosis not present

## 2020-06-22 DIAGNOSIS — E876 Hypokalemia: Secondary | ICD-10-CM | POA: Diagnosis not present

## 2020-06-22 DIAGNOSIS — E1165 Type 2 diabetes mellitus with hyperglycemia: Secondary | ICD-10-CM | POA: Diagnosis not present

## 2020-06-22 DIAGNOSIS — I10 Essential (primary) hypertension: Secondary | ICD-10-CM | POA: Diagnosis not present

## 2020-06-22 DIAGNOSIS — N390 Urinary tract infection, site not specified: Secondary | ICD-10-CM | POA: Diagnosis not present

## 2020-06-22 DIAGNOSIS — N400 Enlarged prostate without lower urinary tract symptoms: Secondary | ICD-10-CM | POA: Diagnosis not present

## 2020-06-22 DIAGNOSIS — K8020 Calculus of gallbladder without cholecystitis without obstruction: Secondary | ICD-10-CM | POA: Diagnosis not present
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
## 2020-06-29 ENCOUNTER — Ambulatory Visit: Payer: Medicare HMO | Admitting: Neurology

## 2020-07-20 DIAGNOSIS — R69 Illness, unspecified: Secondary | ICD-10-CM | POA: Diagnosis not present

## 2020-07-20 DIAGNOSIS — M19012 Primary osteoarthritis, left shoulder: Secondary | ICD-10-CM | POA: Diagnosis not present

## 2020-07-20 DIAGNOSIS — M169 Osteoarthritis of hip, unspecified: Secondary | ICD-10-CM | POA: Diagnosis not present

## 2020-07-20 DIAGNOSIS — E1165 Type 2 diabetes mellitus with hyperglycemia: Secondary | ICD-10-CM | POA: Diagnosis not present

## 2020-07-20 DIAGNOSIS — M19042 Primary osteoarthritis, left hand: Secondary | ICD-10-CM | POA: Diagnosis not present

## 2020-07-20 DIAGNOSIS — E1122 Type 2 diabetes mellitus with diabetic chronic kidney disease: Secondary | ICD-10-CM | POA: Diagnosis not present

## 2020-07-20 DIAGNOSIS — M179 Osteoarthritis of knee, unspecified: Secondary | ICD-10-CM | POA: Diagnosis not present

## 2020-07-20 DIAGNOSIS — K219 Gastro-esophageal reflux disease without esophagitis: Secondary | ICD-10-CM | POA: Diagnosis not present

## 2020-07-20 DIAGNOSIS — I10 Essential (primary) hypertension: Secondary | ICD-10-CM | POA: Diagnosis not present

## 2020-07-20 DIAGNOSIS — E7800 Pure hypercholesterolemia, unspecified: Secondary | ICD-10-CM | POA: Diagnosis not present

## 2020-08-11 DIAGNOSIS — R69 Illness, unspecified: Secondary | ICD-10-CM | POA: Diagnosis not present

## 2020-08-11 DIAGNOSIS — I10 Essential (primary) hypertension: Secondary | ICD-10-CM | POA: Diagnosis not present

## 2020-08-11 DIAGNOSIS — M1990 Unspecified osteoarthritis, unspecified site: Secondary | ICD-10-CM | POA: Diagnosis not present

## 2020-08-11 DIAGNOSIS — K219 Gastro-esophageal reflux disease without esophagitis: Secondary | ICD-10-CM | POA: Diagnosis not present

## 2020-08-11 DIAGNOSIS — N400 Enlarged prostate without lower urinary tract symptoms: Secondary | ICD-10-CM | POA: Diagnosis not present

## 2020-08-11 DIAGNOSIS — E119 Type 2 diabetes mellitus without complications: Secondary | ICD-10-CM | POA: Diagnosis not present

## 2020-08-11 DIAGNOSIS — G8929 Other chronic pain: Secondary | ICD-10-CM | POA: Diagnosis not present

## 2020-08-11 DIAGNOSIS — D649 Anemia, unspecified: Secondary | ICD-10-CM | POA: Diagnosis not present

## 2020-08-11 DIAGNOSIS — G40909 Epilepsy, unspecified, not intractable, without status epilepticus: Secondary | ICD-10-CM | POA: Diagnosis not present

## 2020-08-18 DIAGNOSIS — E1165 Type 2 diabetes mellitus with hyperglycemia: Secondary | ICD-10-CM | POA: Diagnosis not present

## 2020-08-18 DIAGNOSIS — M169 Osteoarthritis of hip, unspecified: Secondary | ICD-10-CM | POA: Diagnosis not present

## 2020-08-18 DIAGNOSIS — E1122 Type 2 diabetes mellitus with diabetic chronic kidney disease: Secondary | ICD-10-CM | POA: Diagnosis not present

## 2020-08-18 DIAGNOSIS — K219 Gastro-esophageal reflux disease without esophagitis: Secondary | ICD-10-CM | POA: Diagnosis not present

## 2020-08-18 DIAGNOSIS — I10 Essential (primary) hypertension: Secondary | ICD-10-CM | POA: Diagnosis not present

## 2020-08-18 DIAGNOSIS — E119 Type 2 diabetes mellitus without complications: Secondary | ICD-10-CM | POA: Diagnosis not present

## 2020-08-18 DIAGNOSIS — M179 Osteoarthritis of knee, unspecified: Secondary | ICD-10-CM | POA: Diagnosis not present

## 2020-08-18 DIAGNOSIS — R69 Illness, unspecified: Secondary | ICD-10-CM | POA: Diagnosis not present

## 2020-08-18 DIAGNOSIS — E7800 Pure hypercholesterolemia, unspecified: Secondary | ICD-10-CM | POA: Diagnosis not present

## 2020-08-18 DIAGNOSIS — N400 Enlarged prostate without lower urinary tract symptoms: Secondary | ICD-10-CM | POA: Diagnosis not present

## 2020-08-29 DIAGNOSIS — E7800 Pure hypercholesterolemia, unspecified: Secondary | ICD-10-CM | POA: Diagnosis not present

## 2020-08-29 DIAGNOSIS — E1165 Type 2 diabetes mellitus with hyperglycemia: Secondary | ICD-10-CM | POA: Diagnosis not present

## 2020-08-29 DIAGNOSIS — K219 Gastro-esophageal reflux disease without esophagitis: Secondary | ICD-10-CM | POA: Diagnosis not present

## 2020-08-29 DIAGNOSIS — E119 Type 2 diabetes mellitus without complications: Secondary | ICD-10-CM | POA: Diagnosis not present

## 2020-08-29 DIAGNOSIS — R69 Illness, unspecified: Secondary | ICD-10-CM | POA: Diagnosis not present

## 2020-08-29 DIAGNOSIS — M1990 Unspecified osteoarthritis, unspecified site: Secondary | ICD-10-CM | POA: Diagnosis not present

## 2020-08-29 DIAGNOSIS — I10 Essential (primary) hypertension: Secondary | ICD-10-CM | POA: Diagnosis not present

## 2020-08-29 DIAGNOSIS — N400 Enlarged prostate without lower urinary tract symptoms: Secondary | ICD-10-CM | POA: Diagnosis not present

## 2020-08-29 DIAGNOSIS — E1122 Type 2 diabetes mellitus with diabetic chronic kidney disease: Secondary | ICD-10-CM | POA: Diagnosis not present

## 2020-10-10 DIAGNOSIS — K219 Gastro-esophageal reflux disease without esophagitis: Secondary | ICD-10-CM | POA: Diagnosis not present

## 2020-10-10 DIAGNOSIS — I10 Essential (primary) hypertension: Secondary | ICD-10-CM | POA: Diagnosis not present

## 2020-10-10 DIAGNOSIS — M1990 Unspecified osteoarthritis, unspecified site: Secondary | ICD-10-CM | POA: Diagnosis not present

## 2020-10-10 DIAGNOSIS — E1122 Type 2 diabetes mellitus with diabetic chronic kidney disease: Secondary | ICD-10-CM | POA: Diagnosis not present

## 2020-10-10 DIAGNOSIS — E7800 Pure hypercholesterolemia, unspecified: Secondary | ICD-10-CM | POA: Diagnosis not present

## 2020-10-10 DIAGNOSIS — R69 Illness, unspecified: Secondary | ICD-10-CM | POA: Diagnosis not present

## 2020-10-10 DIAGNOSIS — E1165 Type 2 diabetes mellitus with hyperglycemia: Secondary | ICD-10-CM | POA: Diagnosis not present

## 2020-10-10 DIAGNOSIS — N400 Enlarged prostate without lower urinary tract symptoms: Secondary | ICD-10-CM | POA: Diagnosis not present

## 2020-10-11 ENCOUNTER — Other Ambulatory Visit: Payer: Self-pay | Admitting: Neurology

## 2020-12-18 DIAGNOSIS — E119 Type 2 diabetes mellitus without complications: Secondary | ICD-10-CM | POA: Diagnosis not present

## 2020-12-18 DIAGNOSIS — M1990 Unspecified osteoarthritis, unspecified site: Secondary | ICD-10-CM | POA: Diagnosis not present

## 2020-12-18 DIAGNOSIS — R69 Illness, unspecified: Secondary | ICD-10-CM | POA: Diagnosis not present

## 2020-12-18 DIAGNOSIS — E1165 Type 2 diabetes mellitus with hyperglycemia: Secondary | ICD-10-CM | POA: Diagnosis not present

## 2020-12-18 DIAGNOSIS — N400 Enlarged prostate without lower urinary tract symptoms: Secondary | ICD-10-CM | POA: Diagnosis not present

## 2020-12-18 DIAGNOSIS — E1122 Type 2 diabetes mellitus with diabetic chronic kidney disease: Secondary | ICD-10-CM | POA: Diagnosis not present

## 2020-12-18 DIAGNOSIS — E7800 Pure hypercholesterolemia, unspecified: Secondary | ICD-10-CM | POA: Diagnosis not present

## 2020-12-18 DIAGNOSIS — K219 Gastro-esophageal reflux disease without esophagitis: Secondary | ICD-10-CM | POA: Diagnosis not present

## 2020-12-18 DIAGNOSIS — I10 Essential (primary) hypertension: Secondary | ICD-10-CM | POA: Diagnosis not present

## 2020-12-19 DIAGNOSIS — I10 Essential (primary) hypertension: Secondary | ICD-10-CM | POA: Diagnosis not present

## 2020-12-19 DIAGNOSIS — Z7984 Long term (current) use of oral hypoglycemic drugs: Secondary | ICD-10-CM | POA: Diagnosis not present

## 2020-12-19 DIAGNOSIS — R69 Illness, unspecified: Secondary | ICD-10-CM | POA: Diagnosis not present

## 2020-12-19 DIAGNOSIS — M7500 Adhesive capsulitis of unspecified shoulder: Secondary | ICD-10-CM | POA: Diagnosis not present

## 2020-12-19 DIAGNOSIS — M25512 Pain in left shoulder: Secondary | ICD-10-CM | POA: Diagnosis not present

## 2020-12-19 DIAGNOSIS — E1122 Type 2 diabetes mellitus with diabetic chronic kidney disease: Secondary | ICD-10-CM | POA: Diagnosis not present

## 2020-12-27 ENCOUNTER — Ambulatory Visit: Payer: Medicare HMO | Admitting: Neurology

## 2021-01-01 DIAGNOSIS — I10 Essential (primary) hypertension: Secondary | ICD-10-CM | POA: Diagnosis not present

## 2021-01-01 DIAGNOSIS — M1990 Unspecified osteoarthritis, unspecified site: Secondary | ICD-10-CM | POA: Diagnosis not present

## 2021-01-01 DIAGNOSIS — M169 Osteoarthritis of hip, unspecified: Secondary | ICD-10-CM | POA: Diagnosis not present

## 2021-01-01 DIAGNOSIS — R69 Illness, unspecified: Secondary | ICD-10-CM | POA: Diagnosis not present

## 2021-01-01 DIAGNOSIS — E119 Type 2 diabetes mellitus without complications: Secondary | ICD-10-CM | POA: Diagnosis not present

## 2021-01-01 DIAGNOSIS — M179 Osteoarthritis of knee, unspecified: Secondary | ICD-10-CM | POA: Diagnosis not present

## 2021-01-01 DIAGNOSIS — K219 Gastro-esophageal reflux disease without esophagitis: Secondary | ICD-10-CM | POA: Diagnosis not present

## 2021-01-01 DIAGNOSIS — E7800 Pure hypercholesterolemia, unspecified: Secondary | ICD-10-CM | POA: Diagnosis not present

## 2021-01-01 DIAGNOSIS — E1165 Type 2 diabetes mellitus with hyperglycemia: Secondary | ICD-10-CM | POA: Diagnosis not present

## 2021-01-01 DIAGNOSIS — E1122 Type 2 diabetes mellitus with diabetic chronic kidney disease: Secondary | ICD-10-CM | POA: Diagnosis not present

## 2021-02-23 DIAGNOSIS — R609 Edema, unspecified: Secondary | ICD-10-CM | POA: Diagnosis not present

## 2021-02-28 ENCOUNTER — Other Ambulatory Visit: Payer: Self-pay | Admitting: Neurology

## 2021-03-08 DIAGNOSIS — K219 Gastro-esophageal reflux disease without esophagitis: Secondary | ICD-10-CM | POA: Diagnosis not present

## 2021-03-08 DIAGNOSIS — R69 Illness, unspecified: Secondary | ICD-10-CM | POA: Diagnosis not present

## 2021-03-08 DIAGNOSIS — N400 Enlarged prostate without lower urinary tract symptoms: Secondary | ICD-10-CM | POA: Diagnosis not present

## 2021-03-08 DIAGNOSIS — M179 Osteoarthritis of knee, unspecified: Secondary | ICD-10-CM | POA: Diagnosis not present

## 2021-03-08 DIAGNOSIS — E1122 Type 2 diabetes mellitus with diabetic chronic kidney disease: Secondary | ICD-10-CM | POA: Diagnosis not present

## 2021-03-08 DIAGNOSIS — E119 Type 2 diabetes mellitus without complications: Secondary | ICD-10-CM | POA: Diagnosis not present

## 2021-03-08 DIAGNOSIS — E1165 Type 2 diabetes mellitus with hyperglycemia: Secondary | ICD-10-CM | POA: Diagnosis not present

## 2021-03-08 DIAGNOSIS — I10 Essential (primary) hypertension: Secondary | ICD-10-CM | POA: Diagnosis not present

## 2021-03-08 DIAGNOSIS — M169 Osteoarthritis of hip, unspecified: Secondary | ICD-10-CM | POA: Diagnosis not present

## 2021-03-08 DIAGNOSIS — E7800 Pure hypercholesterolemia, unspecified: Secondary | ICD-10-CM | POA: Diagnosis not present

## 2021-03-28 ENCOUNTER — Emergency Department (HOSPITAL_COMMUNITY): Payer: Medicare HMO

## 2021-03-28 ENCOUNTER — Emergency Department (HOSPITAL_COMMUNITY)
Admission: EM | Admit: 2021-03-28 | Discharge: 2021-03-29 | Disposition: A | Discharge status: Home / Self Care | Payer: Medicare HMO | Attending: Emergency Medicine | Admitting: Emergency Medicine

## 2021-03-28 DIAGNOSIS — I10 Essential (primary) hypertension: Secondary | ICD-10-CM | POA: Insufficient documentation

## 2021-03-28 DIAGNOSIS — Z20822 Contact with and (suspected) exposure to covid-19: Secondary | ICD-10-CM | POA: Insufficient documentation

## 2021-03-28 DIAGNOSIS — R42 Dizziness and giddiness: Secondary | ICD-10-CM | POA: Diagnosis not present

## 2021-03-28 DIAGNOSIS — R0902 Hypoxemia: Secondary | ICD-10-CM | POA: Diagnosis not present

## 2021-03-28 DIAGNOSIS — Z7984 Long term (current) use of oral hypoglycemic drugs: Secondary | ICD-10-CM | POA: Diagnosis not present

## 2021-03-28 DIAGNOSIS — Z743 Need for continuous supervision: Secondary | ICD-10-CM | POA: Diagnosis not present

## 2021-03-28 DIAGNOSIS — N390 Urinary tract infection, site not specified: Secondary | ICD-10-CM | POA: Diagnosis not present

## 2021-03-28 DIAGNOSIS — E119 Type 2 diabetes mellitus without complications: Secondary | ICD-10-CM | POA: Diagnosis not present

## 2021-03-28 DIAGNOSIS — I959 Hypotension, unspecified: Secondary | ICD-10-CM | POA: Diagnosis not present

## 2021-03-28 DIAGNOSIS — R001 Bradycardia, unspecified: Secondary | ICD-10-CM | POA: Diagnosis not present

## 2021-03-28 DIAGNOSIS — Z79899 Other long term (current) drug therapy: Secondary | ICD-10-CM | POA: Insufficient documentation

## 2021-03-28 DIAGNOSIS — I700 Atherosclerosis of aorta: Secondary | ICD-10-CM | POA: Diagnosis not present

## 2021-03-28 DIAGNOSIS — Z87891 Personal history of nicotine dependence: Secondary | ICD-10-CM | POA: Diagnosis not present

## 2021-03-28 LAB — PROTIME-INR
INR: 1 (ref 0.8–1.2)
Prothrombin Time: 13.6 seconds (ref 11.4–15.2)

## 2021-03-28 LAB — RESP PANEL BY RT-PCR (FLU A&B, COVID) ARPGX2
Influenza A by PCR: NEGATIVE
Influenza B by PCR: NEGATIVE
SARS Coronavirus 2 by RT PCR: NEGATIVE

## 2021-03-28 LAB — URINALYSIS, ROUTINE W REFLEX MICROSCOPIC
Bilirubin Urine: NEGATIVE
Glucose, UA: NEGATIVE mg/dL
Ketones, ur: 80 mg/dL — AB
Nitrite: POSITIVE — AB
Protein, ur: 30 mg/dL — AB
Specific Gravity, Urine: 1.012 (ref 1.005–1.030)
pH: 5 (ref 5.0–8.0)

## 2021-03-28 LAB — COMPREHENSIVE METABOLIC PANEL
ALT: 25 U/L (ref 0–44)
AST: 82 U/L — ABNORMAL HIGH (ref 15–41)
Albumin: 3.1 g/dL — ABNORMAL LOW (ref 3.5–5.0)
Alkaline Phosphatase: 105 U/L (ref 38–126)
Anion gap: 25 — ABNORMAL HIGH (ref 5–15)
BUN: 12 mg/dL (ref 8–23)
CO2: 16 mmol/L — ABNORMAL LOW (ref 22–32)
Calcium: 7.8 mg/dL — ABNORMAL LOW (ref 8.9–10.3)
Chloride: 103 mmol/L (ref 98–111)
Creatinine, Ser: 0.75 mg/dL (ref 0.61–1.24)
GFR, Estimated: >60 mL/min (ref >60)
Glucose, Bld: 79 mg/dL (ref 70–99)
Potassium: 3.4 mmol/L — ABNORMAL LOW (ref 3.5–5.1)
Sodium: 144 mmol/L (ref 135–145)
Total Bilirubin: 1.6 mg/dL — ABNORMAL HIGH (ref 0.3–1.2)
Total Protein: 6.4 g/dL — ABNORMAL LOW (ref 6.5–8.1)

## 2021-03-28 LAB — CBC WITH DIFFERENTIAL/PLATELET
Abs Immature Granulocytes: 0.01 10*3/uL (ref 0.00–0.07)
Basophils Absolute: 0 10*3/uL (ref 0.0–0.1)
Basophils Relative: 1 %
Eosinophils Absolute: 0 10*3/uL (ref 0.0–0.5)
Eosinophils Relative: 0 %
HCT: 35.1 % — ABNORMAL LOW (ref 39.0–52.0)
Hemoglobin: 11.3 g/dL — ABNORMAL LOW (ref 13.0–17.0)
Immature Granulocytes: 0 %
Lymphocytes Relative: 34 %
Lymphs Abs: 1.6 10*3/uL (ref 0.7–4.0)
MCH: 33.7 pg (ref 26.0–34.0)
MCHC: 32.2 g/dL (ref 30.0–36.0)
MCV: 104.8 fL — ABNORMAL HIGH (ref 80.0–100.0)
Monocytes Absolute: 0.4 10*3/uL (ref 0.1–1.0)
Monocytes Relative: 8 %
Neutro Abs: 2.7 10*3/uL (ref 1.7–7.7)
Neutrophils Relative %: 57 %
Platelets: 145 10*3/uL — ABNORMAL LOW (ref 150–400)
RBC: 3.35 MIL/uL — ABNORMAL LOW (ref 4.22–5.81)
RDW: 14.5 % (ref 11.5–15.5)
WBC: 4.8 10*3/uL (ref 4.0–10.5)
nRBC: 0 % (ref 0.0–0.2)

## 2021-03-28 LAB — LACTIC ACID, PLASMA
Lactic Acid, Venous: 2.2 mmol/L (ref 0.5–1.9)
Lactic Acid, Venous: 2.3 mmol/L (ref 0.5–1.9)

## 2021-03-28 MED ORDER — SODIUM CHLORIDE 0.9 % IV BOLUS
1000.0000 mL | Freq: Once | INTRAVENOUS | Status: AC
  Administered 2021-03-28: 1000 mL via INTRAVENOUS

## 2021-03-28 MED ORDER — SODIUM CHLORIDE 0.9 % IV SOLN
1.0000 g | Freq: Once | INTRAVENOUS | Status: AC
  Administered 2021-03-28: 1 g via INTRAVENOUS
  Filled 2021-03-28: qty 10

## 2021-03-28 NOTE — ED Provider Notes (Signed)
[facility] DEPT Provider Note   CSN: [id number] Arrival date & time: 03/28/21  1631     History Chief Complaint  Patient presents with   Hypotension    Joseph Copeland is a 80 y.o. male.  Patient presents to the ER chief complaint of feeling lightheaded and sleepy.  Patient states he lives at home with his wife.  There is a nurse that comes 5 times out of the week that checks on his wife.  He states that the nurse called EMS because the nurse thought that he was appearing very lightheaded and sleepy.  Patient himself is not sure exactly what happened.  Has no specific complaints.  States he feels "fine" at this time.  Denies any headache or chest pain or abdominal pain.  No reports of fevers or cough or vomiting or diarrhea.  He states that he drinks alcohol nearly daily, last drink was this afternoon.  Denies any fall or trauma.      Past Medical History:  Diagnosis Date   Arthritis    Diabetes mellitus    type 2-no meds   Glaucoma    Hypertension    Low testosterone    Seizures ([place])    first12/12-none since   Sleep apnea    nasal CPAP at HS    Patient Active Problem List   Diagnosis Date Noted   Hypomagnesemia 04/21/2016   Diarrhea 04/21/2016   Dehydration 64/15/8309   Metabolic acidosis 40/76/8088   AKI (acute kidney injury) ([place])    Malnutrition of moderate degree 01/19/2016   Orthostatic hypotension 01/16/2016   Dysphagia 01/16/2016   Tachycardia 01/16/2016   Weight loss 01/16/2016   Seizure disorder ([place]) 01/16/2016   Elevated transaminase level    Hypokalemia    Alcohol dependence ([place]) 01/18/2013   Onychomycosis 12/01/2012   Pain in joint, ankle and foot 12/01/2012   UTI (lower urinary tract infection) 02/09/2012   Seizures, generalized convulsive ([place]) 06/03/2011   HTN (hypertension) 06/03/2011   Diabetes mellitus with complication ([place]) 04/26/1593   Anemia 06/03/2011    Past Surgical History:  Procedure Laterality  Date   COLONOSCOPY     ESOPHAGOGASTRODUODENOSCOPY N/A 01/17/2016   Procedure: ESOPHAGOGASTRODUODENOSCOPY (EGD);  Surgeon: Wilford Corner, MD;  Location: [facility] ENDOSCOPY;  Service: Endoscopy;  Laterality: N/A;   hemrhoidectomy  1988   LIPOSUCTION  10/07/2011   Procedure: LIPOSUCTION;  Surgeon: Cristine Polio, MD;  Location: [facility];  Service: Plastics;  Laterality: Left;   MASS EXCISION  10/07/2011   Procedure: EXCISION MASS;  Surgeon: Cristine Polio, MD;  Location: [place];  Service: Plastics;  Laterality: Left;  excision of large mass on left back with possible lipo assistence       Family History  Problem Relation Age of Onset   Alcohol abuse Father    Alcohol abuse Paternal Grandfather     Social History   Tobacco Use   Smoking status: Former    Types: Cigarettes    Quit date: 10/03/1966    Years since quitting: 54.5   Smokeless tobacco: Never   Tobacco comments:    quit smoking 1971  Substance Use Topics   Alcohol use: Yes    Alcohol/week: 21.0 standard drinks    Types: 21 Shots of liquor per week    Comment: occ   Drug use: No    Home Medications Prior to Admission medications   Medication Sig Start Date End Date Taking? Authorizing Provider  acetaminophen (TYLENOL) 650 MG  CR tablet Take 650 mg by mouth 2 (two) times daily.   Yes [provider]  cephALEXin (KEFLEX) 500 MG capsule Take 1 capsule (500 mg total) by mouth 3 (three) times daily for 7 days. 03/29/21 04/05/21 Yes Luna Fuse, MD  levETIRAcetam (KEPPRA) 500 MG tablet TAKE ONE TABLET BY MOUTH TWICE DAILY Patient taking differently: Take 500 mg by mouth 2 (two) times daily. 02/28/21  Yes Kathrynn Ducking, MD  metoprolol tartrate (LOPRESSOR) 25 MG tablet Take 25 mg by mouth daily.   Yes [provider]  Multiple Vitamin (MULTIVITAMIN WITH MINERALS) TABS tablet Take 1 tablet by mouth daily. 01/21/16  Yes Mikhail, Maryann, DO  potassium chloride SA  (K-DUR,KLOR-CON) 20 MEQ tablet Take 1 tablet (20 mEq total) by mouth 2 (two) times daily for 3 days. Patient taking differently: Take 20 mEq by mouth daily. 10/15/17 03/28/21 Yes Charlann Lange, PA-C  tamsulosin (FLOMAX) 0.4 MG CAPS capsule Take 0.4 mg by mouth at bedtime. 08/19/17  Yes [provider]  ferrous sulfate 325 (65 FE) MG tablet Take 1 tablet (325 mg total) by mouth 2 (two) times daily with a meal. Patient not taking: No sig reported 01/21/16   Cristal Ford, DO  folic acid (FOLVITE) 1 MG tablet Take 1 tablet (1 mg total) by mouth daily. Patient not taking: No sig reported 01/21/16   Cristal Ford, DO  LORazepam (ATIVAN) 1 MG tablet Take 1 mg by mouth every morning. Patient not taking: No sig reported 10/13/17   [provider]  magnesium oxide (MAG-OX) 400 (241.3 Mg) MG tablet Take 1 tablet (400 mg total) by mouth daily. Patient not taking: No sig reported 01/21/16   Cristal Ford, DO  metFORMIN (GLUCOPHAGE) 500 MG tablet Take 1 tablet (500 mg total) by mouth 2 (two) times daily with a meal. Patient taking differently: Take 500 mg by mouth at bedtime. 10/15/17   Charlann Lange, PA-C  pantoprazole (PROTONIX) 40 MG tablet Take 1 tablet (40 mg total) by mouth 2 (two) times daily. Patient not taking: No sig reported 01/21/16   Cristal Ford, DO  potassium chloride (K-DUR) 10 MEQ tablet Take 1 tablet (10 mEq total) by mouth daily. Patient not taking: No sig reported 01/21/16   Cristal Ford, DO  sucralfate (CARAFATE) 1 GM/10ML suspension Take 10 mLs (1 g total) by mouth 4 (four) times daily -  with meals and at bedtime. Patient not taking: No sig reported 01/21/16   Cristal Ford, DO  thiamine 100 MG tablet Take 1 tablet (100 mg total) by mouth daily. Patient not taking: No sig reported 01/21/16   Cristal Ford, DO    Allergies    Patient has no known allergies.  Review of Systems   Review of Systems  Constitutional:  Negative for fever.  HENT:   Negative for ear pain and sore throat.   Eyes:  Negative for pain.  Respiratory:  Negative for cough.   Cardiovascular:  Negative for chest pain.  Gastrointestinal:  Negative for abdominal pain.  Genitourinary:  Negative for flank pain.  Musculoskeletal:  Negative for back pain.  Skin:  Negative for color change and rash.  Neurological:  Negative for syncope.  All other systems reviewed and are negative.  Physical Exam Updated Vital Signs BP 111/67   Pulse 82   Temp 98.3 F (36.8 C) (Oral)   Resp 11   Ht 5\' 2"  (1.575 m)   Wt 49 kg   SpO2 98%   BMI 19.75 kg/m  Physical Exam Constitutional:      Appearance: He is well-developed.  HENT:     Head: Normocephalic.     Nose: Nose normal.  Eyes:     Extraocular Movements: Extraocular movements intact.  Cardiovascular:     Rate and Rhythm: Normal rate.  Pulmonary:     Effort: Pulmonary effort is normal.  Skin:    Coloration: Skin is not jaundiced.  Neurological:     General: No focal deficit present.     Mental Status: He is alert and oriented to person, place, and time. Mental status is at baseline.     Cranial Nerves: No cranial nerve deficit.     Motor: No weakness.    ED Results / Procedures / Treatments   Labs (all labs ordered are listed, but only abnormal results are displayed) Labs Reviewed  CBC WITH DIFFERENTIAL/PLATELET - Abnormal; Notable for the following components:      Result Value   RBC 3.35 (*)    Hemoglobin 11.3 (*)    HCT 35.1 (*)    MCV 104.8 (*)    Platelets 145 (*)    All other components within normal limits  COMPREHENSIVE METABOLIC PANEL - Abnormal; Notable for the following components:   Potassium 3.4 (*)    CO2 16 (*)    Calcium 7.8 (*)    Total Protein 6.4 (*)    Albumin 3.1 (*)    AST 82 (*)    Total Bilirubin 1.6 (*)    Anion gap 25 (*)    All other components within normal limits  LACTIC ACID, PLASMA - Abnormal; Notable for the following components:   Lactic Acid, Venous 2.2  (*)    All other components within normal limits  LACTIC ACID, PLASMA - Abnormal; Notable for the following components:   Lactic Acid, Venous 2.3 (*)    All other components within normal limits  URINALYSIS, ROUTINE W REFLEX MICROSCOPIC - Abnormal; Notable for the following components:   Hgb urine dipstick LARGE (*)    Ketones, ur 80 (*)    Protein, ur 30 (*)    Nitrite POSITIVE (*)    Leukocytes,Ua MODERATE (*)    Bacteria, UA RARE (*)    All other components within normal limits  RESP PANEL BY RT-PCR (FLU A&B, COVID) ARPGX2  CULTURE, BLOOD (ROUTINE X 2)  CULTURE, BLOOD (ROUTINE X 2)  PROTIME-INR  LACTIC ACID, PLASMA  LACTIC ACID, PLASMA    EKG EKG Interpretation  Date/Time:  Wednesday March 28 2021 19:20:35 EDT Ventricular Rate:  88 PR Interval:  155 QRS Duration: 137 QT Interval:  406 QTC Calculation: 492 R Axis:   47 Text Interpretation: Sinus rhythm Nonspecific intraventricular conduction delay Confirmed by Thamas Jaegers ([id number]) on 03/28/2021 9:29:17 PM  Radiology DG Chest Portable 1 View  Result Date: 03/28/2021 CLINICAL DATA:  Hypotension. EXAM: PORTABLE CHEST 1 VIEW COMPARISON:  October 14, 2017 FINDINGS: The heart size and mediastinal contours are within normal limits. There is mild calcification of the aortic arch. Both lungs are clear. The visualized skeletal structures are unremarkable. IMPRESSION: No active disease. Electronically Signed   By: Virgina Norfolk M.D.   On: 03/28/2021 21:32    Procedures Procedures   Medications Ordered in ED Medications  sodium chloride 0.9 % bolus 1,000 mL (0 mLs Intravenous Stopped 03/28/21 2105)  sodium chloride 0.9 % bolus 1,000 mL (0 mLs Intravenous Stopped 03/28/21 2232)  cefTRIAXone (ROCEPHIN) 1 g in sodium chloride 0.9 % 100 mL IVPB (0 g  Intravenous Stopped 03/28/21 2346)    ED Course  I have reviewed the triage vital signs and the nursing notes.  Pertinent labs & imaging results that were available during my care of  the patient were reviewed by me and considered in my medical decision making (see chart for details).    MDM Rules/Calculators/A&P                           There was report that the patient had low blood pressure at the scene, however blood pressure appears normal here.  Patient given additional IV fluid resuscitation and labs are sent.  White count is normal lactic acid mildly elevated.  Patient is afebrile here in the ER.  Urinalysis positive for UTI.  Patient given Rocephin here in the ER will given prescription antibiotics to go home with.  Repeat lactic acid appears improved after fluid resuscitation.  Patient continues to be asymptomatic with no complaints of any pain or discomfort.  Discharged home stable condition, advised close outpatient follow-up in 2 to 3 days, advised immediate return for worsening symptoms fevers or any additional concerns.   Final Clinical Impression(s) / ED Diagnoses Final diagnoses:  Urinary tract infection without hematuria, site unspecified    Rx / DC Orders ED Discharge Orders          Ordered    cephALEXin (KEFLEX) 500 MG capsule  3 times daily        03/29/21 0034             Luna Fuse, MD 03/29/21 [phone]

## 2021-03-28 NOTE — ED Notes (Signed)
Patient care taken, has no complaints, alert and oriented.

## 2021-03-28 NOTE — ED Triage Notes (Signed)
Ems brings pt in from home today for hypotension. Pts initial BP was 86/50. Family reports pt has been drinking alcohol and not eating as much. Also not taking medications. Pt reports dizziness.

## 2021-03-29 LAB — LACTIC ACID, PLASMA: Lactic Acid, Venous: 1.8 mmol/L (ref 0.5–1.9)

## 2021-03-29 MED ORDER — CEPHALEXIN 500 MG PO CAPS: 500.0000 mg | ORAL_CAPSULE | Freq: Three times a day (TID) | ORAL | 0 refills | Status: AC

## 2021-03-29 NOTE — Discharge Instructions (Addendum)
Call your primary care doctor or specialist as discussed in the next 2-3 days.   Return immediately back to the ER if:  Your symptoms worsen within the next 12-24 hours. You develop new symptoms such as new fevers, persistent vomiting, new pain, shortness of breath, or new weakness or numbness, or if you have any other concerns.  

## 2021-04-02 LAB — CULTURE, BLOOD (ROUTINE X 2)
Culture: NO GROWTH
Culture: NO GROWTH
Special Requests: ADEQUATE
Special Requests: ADEQUATE

## 2021-05-18 ENCOUNTER — Encounter (HOSPITAL_COMMUNITY): Payer: Self-pay | Admitting: Emergency Medicine

## 2021-05-18 ENCOUNTER — Ambulatory Visit (HOSPITAL_COMMUNITY)
Admission: EM | Admit: 2021-05-18 | Discharge: 2021-05-18 | Disposition: A | Discharge status: Home / Self Care | Payer: Medicare HMO | Attending: Internal Medicine | Admitting: Internal Medicine

## 2021-05-18 ENCOUNTER — Other Ambulatory Visit: Payer: Self-pay

## 2021-05-18 DIAGNOSIS — M79641 Pain in right hand: Secondary | ICD-10-CM

## 2021-05-18 DIAGNOSIS — R21 Rash and other nonspecific skin eruption: Secondary | ICD-10-CM

## 2021-05-18 MED ORDER — IBUPROFEN 600 MG PO TABS: 600.0000 mg | ORAL_TABLET | Freq: Four times a day (QID) | ORAL | 0 refills | Status: AC | PRN

## 2021-05-18 MED ORDER — TRIAMCINOLONE ACETONIDE 0.1 % EX CREA: 1.0000 "application " | TOPICAL_CREAM | Freq: Two times a day (BID) | CUTANEOUS | 0 refills | Status: AC

## 2021-05-18 NOTE — Discharge Instructions (Signed)
Take medications as prescribed Use ointment as prescribed If you have worsening symptoms please return to urgent care to be reevaluated No indication for x-ray of the hand. Return to urgent care if symptoms worsen.

## 2021-05-18 NOTE — ED Provider Notes (Signed)
[place]    CSN: [id number] Arrival date & time: 05/18/21  1400      History   Chief Complaint Chief Complaint  Patient presents with   Hand Pain    HPI Joseph Copeland is a 80 y.o. male comes to the urgent care with 3-day history of right hand pain.  Symptoms started after patient woke in the morning of 05/16/2021.  Patient denies any trauma to the hand.  Pain is around the metacarpophalangeal joints.  No trauma to the hands.  Pain is throbbing in nature, of moderate severity with no known relieving factors.  Pain is aggravated by making a fist.  No deformity noted. HPI  Past Medical History:  Diagnosis Date   Arthritis    Diabetes mellitus    type 2-no meds   Glaucoma    Hypertension    Low testosterone    Seizures ([place])    first12/12-none since   Sleep apnea    nasal CPAP at HS    Patient Active Problem List   Diagnosis Date Noted   Hypomagnesemia 04/21/2016   Diarrhea 04/21/2016   Dehydration 96/29/5284   Metabolic acidosis 13/24/4010   AKI (acute kidney injury) ([place])    Malnutrition of moderate degree 01/19/2016   Orthostatic hypotension 01/16/2016   Dysphagia 01/16/2016   Tachycardia 01/16/2016   Weight loss 01/16/2016   Seizure disorder ([place]) 01/16/2016   Elevated transaminase level    Hypokalemia    Alcohol dependence ([place]) 01/18/2013   Onychomycosis 12/01/2012   Pain in joint, ankle and foot 12/01/2012   UTI (lower urinary tract infection) 02/09/2012   Seizures, generalized convulsive ([place]) 06/03/2011   HTN (hypertension) 06/03/2011   Diabetes mellitus with complication ([place]) 27/25/3664   Anemia 06/03/2011    Past Surgical History:  Procedure Laterality Date   COLONOSCOPY     ESOPHAGOGASTRODUODENOSCOPY N/A 01/17/2016   Procedure: ESOPHAGOGASTRODUODENOSCOPY (EGD);  Surgeon: Wilford Corner, MD;  Location: [facility] ENDOSCOPY;  Service: Endoscopy;  Laterality: N/A;   hemrhoidectomy  1988   LIPOSUCTION  10/07/2011   Procedure: LIPOSUCTION;   Surgeon: Cristine Polio, MD;  Location: [place];  Service: Plastics;  Laterality: Left;   MASS EXCISION  10/07/2011   Procedure: EXCISION MASS;  Surgeon: Cristine Polio, MD;  Location: [place];  Service: Plastics;  Laterality: Left;  excision of large mass on left back with possible lipo assistence       Home Medications    Prior to Admission medications   Medication Sig Start Date End Date Taking? Authorizing Provider  ibuprofen (ADVIL) 600 MG tablet Take 1 tablet (600 mg total) by mouth every 6 (six) hours as needed. 05/18/21  Yes Zhoey Blackstock, Myrene Galas, MD  triamcinolone cream (KENALOG) 0.1 % Apply 1 application topically 2 (two) times daily. 05/18/21  Yes Shavone Nevers, Myrene Galas, MD  acetaminophen (TYLENOL) 650 MG CR tablet Take 650 mg by mouth 2 (two) times daily.    [provider]  metoprolol tartrate (LOPRESSOR) 25 MG tablet Take 25 mg by mouth daily.    [provider]  Multiple Vitamin (MULTIVITAMIN WITH MINERALS) TABS tablet Take 1 tablet by mouth daily. 01/21/16   Cristal Ford, DO  tamsulosin (FLOMAX) 0.4 MG CAPS capsule Take 0.4 mg by mouth at bedtime. 08/19/17   [provider]    Family History Family History  Problem Relation Age of Onset   Alcohol abuse Father    Alcohol abuse Paternal Grandfather     Social History Social History  Tobacco Use   Smoking status: Former    Types: Cigarettes    Quit date: 10/03/1966    Years since quitting: 54.6   Smokeless tobacco: Never   Tobacco comments:    quit smoking 1971  Substance Use Topics   Alcohol use: Yes    Alcohol/week: 21.0 standard drinks    Types: 21 Shots of liquor per week    Comment: occ   Drug use: No     Allergies   Patient has no known allergies.   Review of Systems Review of Systems  Musculoskeletal:  Positive for arthralgias and joint swelling. Negative for myalgias, neck pain and neck stiffness.  Skin: Negative.     Physical  Exam Triage Vital Signs ED Triage Vitals  Enc Vitals Group     BP 05/18/21 1509 91/65     Pulse Rate 05/18/21 1509 (!) 106     Resp 05/18/21 1509 19     Temp 05/18/21 1509 98.7 F (37.1 C)     Temp Source 05/18/21 1509 Oral     SpO2 05/18/21 1509 98 %     Weight --      Height --      Head Circumference --      Peak Flow --      Pain Score 05/18/21 1507 9     Pain Loc --      Pain Edu? --      Excl. in [place]? --    No data found.  Updated Vital Signs BP 91/65 (BP Location: Left Arm)   Pulse (!) 106   Temp 98.7 F (37.1 C) (Oral)   Resp 19   SpO2 98%   Visual Acuity Right Eye Distance:   Left Eye Distance:   Bilateral Distance:    Right Eye Near:   Left Eye Near:    Bilateral Near:     Physical Exam Vitals and nursing note reviewed.  Constitutional:      Appearance: He is diaphoretic.  Cardiovascular:     Rate and Rhythm: Normal rate and regular rhythm.  Musculoskeletal:        General: Swelling and tenderness present. No deformity or signs of injury. Normal range of motion.     Right lower leg: No edema.     Left lower leg: No edema.  Skin:    General: Skin is warm.     Coloration: Skin is not pale.     Findings: Erythema present.     Comments: Discoloration involving palmar surfaces of both hands.  Neurological:     Mental Status: He is alert.     UC Treatments / Results  Labs (all labs ordered are listed, but only abnormal results are displayed) Labs Reviewed - No data to display  EKG   Radiology No results found.  Procedures Procedures (including critical care time)  Medications Ordered in UC Medications - No data to display  Initial Impression / Assessment and Plan / UC Course  I have reviewed the triage vital signs and the nursing notes.  Pertinent labs & imaging results that were available during my care of the patient were reviewed by me and considered in my medical decision making (see chart for details).     1.  Right hand  pain: Ibuprofen as needed for pain Gentle range of motion exercises Topical analgesics If symptoms worsen please return to urgent care to be reevaluated  2.  Rash in both hands: Triamcinolone ointment Continue using emollients Return to urgent  care if symptoms worsen.   Final Clinical Impressions(s) / UC Diagnoses   Final diagnoses:  Right hand pain  Rash of both hands     Discharge Instructions      Take medications as prescribed Use ointment as prescribed If you have worsening symptoms please return to urgent care to be reevaluated No indication for x-ray of the hand. Return to urgent care if symptoms worsen.     ED Prescriptions     Medication Sig Dispense Auth. Provider   ibuprofen (ADVIL) 600 MG tablet Take 1 tablet (600 mg total) by mouth every 6 (six) hours as needed. 30 tablet Joseph Copeland, Myrene Galas, MD   triamcinolone cream (KENALOG) 0.1 % Apply 1 application topically 2 (two) times daily. 30 g Raeleen Winstanley, Myrene Galas, MD      PDMP not reviewed this encounter.   Chase Picket, MD 05/18/21 [phone]

## 2021-05-18 NOTE — ED Triage Notes (Signed)
Pt is present today with right hand pain. Pt states that his pain started Tuesday.

## 2021-05-23 DIAGNOSIS — E1165 Type 2 diabetes mellitus with hyperglycemia: Secondary | ICD-10-CM | POA: Diagnosis not present

## 2021-05-23 DIAGNOSIS — R69 Illness, unspecified: Secondary | ICD-10-CM | POA: Diagnosis not present

## 2021-05-23 DIAGNOSIS — E7800 Pure hypercholesterolemia, unspecified: Secondary | ICD-10-CM | POA: Diagnosis not present

## 2021-05-23 DIAGNOSIS — I10 Essential (primary) hypertension: Secondary | ICD-10-CM | POA: Diagnosis not present

## 2021-05-23 DIAGNOSIS — E1122 Type 2 diabetes mellitus with diabetic chronic kidney disease: Secondary | ICD-10-CM | POA: Diagnosis not present

## 2021-05-23 DIAGNOSIS — M1990 Unspecified osteoarthritis, unspecified site: Secondary | ICD-10-CM | POA: Diagnosis not present

## 2021-05-23 DIAGNOSIS — E119 Type 2 diabetes mellitus without complications: Secondary | ICD-10-CM | POA: Diagnosis not present

## 2021-05-23 DIAGNOSIS — N400 Enlarged prostate without lower urinary tract symptoms: Secondary | ICD-10-CM | POA: Diagnosis not present

## 2021-05-23 DIAGNOSIS — K219 Gastro-esophageal reflux disease without esophagitis: Secondary | ICD-10-CM | POA: Diagnosis not present

## 2021-05-31 DIAGNOSIS — E1122 Type 2 diabetes mellitus with diabetic chronic kidney disease: Secondary | ICD-10-CM | POA: Diagnosis not present

## 2021-05-31 DIAGNOSIS — E7800 Pure hypercholesterolemia, unspecified: Secondary | ICD-10-CM | POA: Diagnosis not present

## 2021-05-31 DIAGNOSIS — I10 Essential (primary) hypertension: Secondary | ICD-10-CM | POA: Diagnosis not present

## 2021-05-31 DIAGNOSIS — K219 Gastro-esophageal reflux disease without esophagitis: Secondary | ICD-10-CM | POA: Diagnosis not present

## 2021-05-31 DIAGNOSIS — E1165 Type 2 diabetes mellitus with hyperglycemia: Secondary | ICD-10-CM | POA: Diagnosis not present

## 2021-05-31 DIAGNOSIS — R69 Illness, unspecified: Secondary | ICD-10-CM | POA: Diagnosis not present

## 2021-05-31 DIAGNOSIS — N400 Enlarged prostate without lower urinary tract symptoms: Secondary | ICD-10-CM | POA: Diagnosis not present

## 2021-07-25 DIAGNOSIS — M25512 Pain in left shoulder: Secondary | ICD-10-CM | POA: Diagnosis not present

## 2021-07-25 DIAGNOSIS — R7989 Other specified abnormal findings of blood chemistry: Secondary | ICD-10-CM | POA: Diagnosis not present

## 2021-07-25 DIAGNOSIS — Z7984 Long term (current) use of oral hypoglycemic drugs: Secondary | ICD-10-CM | POA: Diagnosis not present

## 2021-07-25 DIAGNOSIS — R64 Cachexia: Secondary | ICD-10-CM | POA: Diagnosis not present

## 2021-07-25 DIAGNOSIS — R69 Illness, unspecified: Secondary | ICD-10-CM | POA: Diagnosis not present

## 2021-07-25 DIAGNOSIS — Z79899 Other long term (current) drug therapy: Secondary | ICD-10-CM | POA: Diagnosis not present

## 2021-07-25 DIAGNOSIS — R748 Abnormal levels of other serum enzymes: Secondary | ICD-10-CM | POA: Diagnosis not present

## 2021-07-25 DIAGNOSIS — E1122 Type 2 diabetes mellitus with diabetic chronic kidney disease: Secondary | ICD-10-CM | POA: Diagnosis not present

## 2021-07-25 DIAGNOSIS — E559 Vitamin D deficiency, unspecified: Secondary | ICD-10-CM | POA: Diagnosis not present

## 2021-07-25 DIAGNOSIS — I10 Essential (primary) hypertension: Secondary | ICD-10-CM | POA: Diagnosis not present

## 2021-10-18 ENCOUNTER — Telehealth: Payer: Self-pay | Admitting: Nurse Practitioner

## 2021-10-18 NOTE — Telephone Encounter (Signed)
Spoke with patient regarding the Palliative referral/services and he was in agreement with scheduling visit.  I have scheduled a Telehealth Consult for 10/25/21 @ 1:30 PM ?

## 2021-10-25 ENCOUNTER — Other Ambulatory Visit: Payer: Medicare Other | Admitting: Nurse Practitioner

## 2021-10-25 ENCOUNTER — Encounter: Payer: Self-pay | Admitting: Nurse Practitioner

## 2021-10-25 DIAGNOSIS — Z515 Encounter for palliative care: Secondary | ICD-10-CM

## 2021-10-25 DIAGNOSIS — R42 Dizziness and giddiness: Secondary | ICD-10-CM

## 2021-10-25 DIAGNOSIS — R630 Anorexia: Secondary | ICD-10-CM

## 2021-10-25 NOTE — Progress Notes (Signed)
? ? ?Manufacturing engineer ?Community Palliative Care Consult Note ?Telephone: [phone]  ?Fax: [phone]  ? ?Date of encounter: 10/25/21 ?10:10 PM ?PATIENT NAME: Joseph Copeland ?[address] ?[place] [address]   ?[phone] (home) [phone] (work) ?DOB: 05/25/1941 ?MRN: [id number] ?PRIMARY CARE PROVIDER:    ?Joseph Huddle, MD,  ?[address] ?[place] [address] ?[phone] ? ?REFERRING PROVIDER:   ?Joseph Huddle, MD ?[address] ?[address] ?[place],  [place] [address] ?[phone] ? ?RESPONSIBLE PARTY:    ?Contact Information   ? ? Name Relation Home Work Mobile  ? Copeland,Joseph Spouse [phone]    ? Copeland,Joseph Daughter [phone]    ? ?  ? ?Due to the COVID-19 crisis, this visit was done via telemedicine from my office and it was initiated and consent by this patient and or family. ? ?I connected with Joseph Copeland, Joseph Copeland's daughter with Joseph Copeland OR PROXY on 10/25/21 by a telephone as video not available enabled telemedicine application and verified that I am speaking with the correct person using two identifiers. ?  ?I discussed the limitations of evaluation and management by telemedicine. The patient expressed understanding and agreed to proceed.  Palliative Care was asked to follow this patient by consultation request of  Joseph Huddle, MD to address advance care planning and complex medical decision making. This is the initial visit.           ?ASSESSMENT AND PLAN / RECOMMENDATIONS:  ?Symptom Management/Plan: ?1. Advance Care Planning;  ongoing discussions ? ?2 Dizziness likely secondary to dehydration, discussed with alcohol abuse of 1 pint+ per day with last drink yesterday concern for dehydration requiring ivf. Discussed risk and recommended to go to urgent care or ed for workup including labs, ivf. Discussed will revisit once returns from visit ? ?3 Anorexia secondary to debility,decompensation from alcohol abuse, will continue to monitor  weights, nutrition and further discussions with next visits, supplements. We talked about eating habits, patterns though limited with acute symptoms of dizziness.  ? ?4. Goals of Care: Goals include to maximize quality of life and symptom management. Our advance care planning conversation included a discussion about:    ?The value and importance of advance care planning  ?Exploration of personal, cultural or spiritual beliefs that might influence medical decisions  ?Exploration of goals of care in the event of a sudden injury or illness  ?Identification and preparation of a healthcare agent  ?Review and updating or creation of an advance directive document. ? ?5. Palliative care encounter; Palliative care encounter; Palliative medicine team will continue to support patient, patient's family, and medical team. Visit consisted of counseling and education dealing with the complex and emotionally intense issues of symptom management and palliative care in the setting of serious and potentially life-threatening illness ?Follow up Palliative Care Visit: Palliative care will continue to follow for complex medical decision making, advance care planning, and clarification of goals. Return 2 weeks or prn. ? ?I spent 32 minutes providing this consultation. More than 50% of the time in this consultation was spent in counseling and care coordination. ?PPS: 40% ?Chief Complaint: Initial palliative consult for complex medical decision making ? ?HISTORY OF PRESENT ILLNESS:  Joseph Copeland is a 81 y.o. year old male  with multiple medical problems including active alcoholism (1+pint per day), HTN, DM, seizure disorder, anemia. I called Joseph Copeland, Joseph Copeland daughter for telephonic telemedicine initial pc visit. Joseph Copeland in agreement with Joseph Copeland.  ? ?History obtained from review of EMR,  discussion with primary team, and interview with family, facility staff/caregiver and/or Joseph Copeland.  ?I reviewed available labs, medications, imaging,  studies and related documents from the EMR.  Records reviewed and summarized above.  ? ?ROS ?10 point system reviewed all negative except +dizziness, +weight loss, +anorexia ? ?Physical Exam: ?deferred ?CURRENT PROBLEM LIST:  ?Patient Active Problem List  ? Diagnosis Date Noted  ? Hypomagnesemia 04/21/2016  ? Diarrhea 04/21/2016  ? Dehydration 04/17/2016  ? Metabolic acidosis 42/68/3419  ? AKI (acute kidney injury) ([place])   ? Malnutrition of moderate degree 01/19/2016  ? Orthostatic hypotension 01/16/2016  ? Dysphagia 01/16/2016  ? Tachycardia 01/16/2016  ? Weight loss 01/16/2016  ? Seizure disorder ([place]) 01/16/2016  ? Elevated transaminase level   ? Hypokalemia   ? Alcohol dependence ([place]) 01/18/2013  ? Onychomycosis 12/01/2012  ? Pain in joint, ankle and foot 12/01/2012  ? UTI (lower urinary tract infection) 02/09/2012  ? Seizures, generalized convulsive ([place]) 06/03/2011  ? HTN (hypertension) 06/03/2011  ? Diabetes mellitus with complication ([place]) 62/22/9798  ? Anemia 06/03/2011  ? ?PAST MEDICAL HISTORY:  ?Active Ambulatory Problems  ?  Diagnosis Date Noted  ? Seizures, generalized convulsive ([place]) 06/03/2011  ? HTN (hypertension) 06/03/2011  ? Diabetes mellitus with complication ([place]) 92/04/9416  ? Anemia 06/03/2011  ? UTI (lower urinary tract infection) 02/09/2012  ? Onychomycosis 12/01/2012  ? Pain in joint, ankle and foot 12/01/2012  ? Alcohol dependence ([place]) 01/18/2013  ? Orthostatic hypotension 01/16/2016  ? Dysphagia 01/16/2016  ? Tachycardia 01/16/2016  ? Weight loss 01/16/2016  ? Seizure disorder ([place]) 01/16/2016  ? Elevated transaminase level   ? Hypokalemia   ? Malnutrition of moderate degree 01/19/2016  ? Dehydration 04/17/2016  ? Metabolic acidosis 40/81/4481  ? AKI (acute kidney injury) ([place])   ? Hypomagnesemia 04/21/2016  ? Diarrhea 04/21/2016  ? ?Resolved Ambulatory Problems  ?  Diagnosis Date Noted  ? Thrombocytopenia ([place]) 06/03/2011  ? SIRS (systemic inflammatory response syndrome) ([place])  02/09/2012  ? Abdominal distension 02/09/2012  ? Septic shock(785.52) 02/09/2012  ? Delirium due to general medical condition 02/11/2012  ? Cardiac arrest - asystole 02/11/2012  ? Acute respiratory failure ([place]) 02/11/2012  ? Aspiration pneumonia ([place]) 02/11/2012  ? Hypoxemia 02/11/2012  ? ARDS (adult respiratory distress syndrome) ([place]) 02/11/2012  ? Anoxic encephalopathy ([place]) 02/11/2012  ? C. difficile diarrhea 02/14/2012  ? ?Past Medical History:  ?Diagnosis Date  ? Arthritis   ? Diabetes mellitus   ? Glaucoma   ? Hypertension   ? Low testosterone   ? Seizures ([place])   ? Sleep apnea   ? ?SOCIAL HX:  ?Social History  ? ?Tobacco Use  ? Smoking status: Former  ?  Types: Cigarettes  ?  Quit date: 10/03/1966  ?  Years since quitting: 55.0  ? Smokeless tobacco: Never  ? Tobacco comments:  ?  quit smoking 1971  ?Substance Use Topics  ? Alcohol use: Yes  ?  Alcohol/week: 21.0 standard drinks  ?  Types: 21 Shots of liquor per week  ?  Comment: occ  ? ?FAMILY HX:  ?Family History  ?Problem Relation Age of Onset  ? Alcohol abuse Father   ? Alcohol abuse Paternal Grandfather   ?   ? ?ALLERGIES: No Known Allergies   ?PERTINENT MEDICATIONS:  ?Outpatient Encounter Medications as of 10/25/2021  ?Medication Sig  ? acetaminophen (TYLENOL) 650 MG CR tablet Take 650 mg by mouth 2 (two) times daily.  ? ibuprofen (ADVIL) 600 MG tablet Take 1 tablet (  600 mg total) by mouth every 6 (six) hours as needed.  ? metoprolol tartrate (LOPRESSOR) 25 MG tablet Take 25 mg by mouth daily.  ? Multiple Vitamin (MULTIVITAMIN WITH MINERALS) TABS tablet Take 1 tablet by mouth daily.  ? tamsulosin (FLOMAX) 0.4 MG CAPS capsule Take 0.4 mg by mouth at bedtime.  ? triamcinolone cream (KENALOG) 0.1 % Apply 1 application topically 2 (two) times daily.  ? ?No facility-administered encounter medications on file as of 10/25/2021.  ? ?Thank you for the opportunity to participate in the care of Joseph. Deleonardis.  The palliative care team will continue to follow. Please call  our office at [phone] if we can be of additional assistance.  ? ?Lynnet Hefley Z Seleni Meller, NP ,  ?

## 2021-11-21 ENCOUNTER — Other Ambulatory Visit: Payer: Self-pay | Admitting: *Deleted

## 2021-11-21 MED ORDER — LEVETIRACETAM 500 MG PO TABS: 500.0000 mg | ORAL_TABLET | Freq: Two times a day (BID) | ORAL | 0 refills | Status: DC

## 2021-12-13 ENCOUNTER — Encounter: Payer: Self-pay | Admitting: Neurology

## 2021-12-13 ENCOUNTER — Ambulatory Visit: Payer: Medicare Other | Admitting: Neurology

## 2021-12-13 VITALS — BP 90/60 | HR 87 | Ht 62.0 in

## 2021-12-13 DIAGNOSIS — G40909 Epilepsy, unspecified, not intractable, without status epilepticus: Secondary | ICD-10-CM

## 2021-12-13 MED ORDER — LEVETIRACETAM 500 MG PO TABS: 500.0000 mg | ORAL_TABLET | Freq: Two times a day (BID) | ORAL | 3 refills | Status: AC

## 2021-12-13 NOTE — Patient Instructions (Signed)
Continue with Keppra 500 mg twice daily  Return in a year
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
## 2022-05-24 DEATH — deceased

## 2022-12-19 ENCOUNTER — Ambulatory Visit: Payer: Medicare Other | Admitting: Neurology
